# Patient Record
Sex: Female | Born: 1965 | Race: Black or African American | Hispanic: No | Marital: Single | Smoking: Never smoker
Health system: Southern US, Community
[De-identification: ages and names within clinical notes are randomized; demographics above are authoritative.]

## PROBLEM LIST (undated history)

## (undated) DIAGNOSIS — I10 Essential (primary) hypertension: Secondary | ICD-10-CM

## (undated) DIAGNOSIS — G510 Bell's palsy: Secondary | ICD-10-CM

## (undated) HISTORY — PX: OTHER SURGICAL HISTORY: SHX169

## (undated) HISTORY — DX: Bell's palsy: G51.0

## (undated) HISTORY — PX: TONSILLECTOMY: SUR1361

---

## 2012-06-17 ENCOUNTER — Encounter (HOSPITAL_COMMUNITY): Payer: Self-pay | Admitting: *Deleted

## 2012-06-17 ENCOUNTER — Emergency Department (HOSPITAL_COMMUNITY)
Admission: EM | Admit: 2012-06-17 | Discharge: 2012-06-18 | Discharge status: Against Medical Advice | Payer: Self-pay | Attending: Emergency Medicine | Admitting: Emergency Medicine

## 2012-06-17 DIAGNOSIS — R0602 Shortness of breath: Secondary | ICD-10-CM | POA: Insufficient documentation

## 2012-06-17 NOTE — ED Notes (Signed)
Pt. Stated, I was at home and I started having some swelling around my wrist and having shortness of breath less than hour.

## 2012-06-17 NOTE — ED Notes (Signed)
Called patient x3 no answer

## 2014-09-29 ENCOUNTER — Encounter (HOSPITAL_COMMUNITY): Payer: Self-pay | Admitting: Emergency Medicine

## 2014-09-29 ENCOUNTER — Emergency Department (HOSPITAL_COMMUNITY): Payer: Medicaid Other

## 2014-09-29 ENCOUNTER — Emergency Department (HOSPITAL_COMMUNITY)
Admission: EM | Admit: 2014-09-29 | Discharge: 2014-09-30 | Disposition: A | Discharge status: Home / Self Care | Payer: Medicaid Other | Attending: Emergency Medicine | Admitting: Emergency Medicine

## 2014-09-29 DIAGNOSIS — M79621 Pain in right upper arm: Secondary | ICD-10-CM | POA: Insufficient documentation

## 2014-09-29 DIAGNOSIS — M79601 Pain in right arm: Secondary | ICD-10-CM

## 2014-09-29 NOTE — ED Notes (Signed)
The pt has  Rt arm pain for 2 months.   Sometimes she cannot straighten it out.  At present she can straighten it out

## 2014-09-29 NOTE — ED Notes (Addendum)
Pt reports pain has been intermittent, states "a bulge pops up in my arm and my fingers cramp up." Describes pain as excruciating when episode occurs. Denies injury.

## 2014-09-29 NOTE — ED Provider Notes (Signed)
CSN: [id number]     Arrival date & time 09/29/14  1919 History  This chart was scribed for non-physician practitioner, Ladona MowJoe Quetzalli Clos, PA-C working with Toy CookeyMegan Docherty, MD by Greggory StallionKayla Andersen, ED scribe. This patient was seen in room TR04C/TR04C and the patient's care was started at 9:06 PM.   Chief Complaint  Patient presents with  . Arm Pain   The history is provided by the patient. No language interpreter was used.   HPI Comments: Grace Young is a 48 y.o. female who presents to the Emergency Department complaining of intermittent right arm pain that started about 2 months ago. She intermittently has trouble bending her arm but states it has become more frequent and will lock up on her. States her fingers will also cramp up when her arm locks up. Symptoms worsened today. She states she has noticed that it will lock up when she tries to stretch. Denies numbness or tingling. Patient states that her symptoms are typically worse in the morning, as she uses her arm or throughout the day they become better.  History reviewed. No pertinent past medical history. History reviewed. No pertinent past surgical history. No family history on file. History  Substance Use Topics  . Smoking status: Never Smoker   . Smokeless tobacco: Not on file  . Alcohol Use: 0.6 oz/week    1 Shots of liquor per week   OB History   Grav Para Term Preterm Abortions TAB SAB Ect Mult Living                 Review of Systems  Musculoskeletal: Positive for myalgias.  Neurological: Negative for numbness.  All other systems reviewed and are negative.  Allergies  Review of patient's allergies indicates no known allergies.  Home Medications   Prior to Admission medications   Not on File   BP 203/132  Pulse 109  Temp(Src) 98.7 F (37.1 C) (Oral)  Resp 20  Ht 5\' 8"  (1.727 m)  Wt 251 lb (113.853 kg)  BMI 38.17 kg/m2  SpO2 100%  LMP 09/13/2014  Physical Exam  Nursing note and vitals reviewed. Constitutional: She  is oriented to person, place, and time. She appears well-developed and well-nourished. No distress.  HENT:  Head: Normocephalic and atraumatic.  Eyes: Conjunctivae and EOM are normal.  Neck: Neck supple. No tracheal deviation present.  Cardiovascular: Normal rate.   Pulmonary/Chest: Effort normal. No respiratory distress.  Musculoskeletal: Normal range of motion.  Patient has full active and passive range of motion of right shoulder, elbow, wrist. 5 out of 5 motor strength in all shoulder, elbow, wrist. Radial pulse 2+, distal sensation intact. No obvious effusion, bursitis noted the patient's elbow, or shoulder. No evidence of medial or lateral epicondylitis. Tinel negative on the right wrist.   Neurological: She is alert and oriented to person, place, and time.  Skin: Skin is warm and dry.  Psychiatric: She has a normal mood and affect. Her behavior is normal.    ED Course  Procedures (including critical care time)  DIAGNOSTIC STUDIES: Oxygen Saturation is 98% on RA, normal by my interpretation.    COORDINATION OF CARE: 9:17 PM-Discussed treatment plan which includes xray with pt at bedside and pt agreed to plan.   Labs Review Labs Reviewed  I-STAT CHEM 8, ED - Abnormal; Notable for the following:    Potassium 3.4 (*)    Glucose, Bld 102 (*)    All other components within normal limits    Imaging Review Dg Elbow  Complete Right  09/29/2014   CLINICAL DATA:  Acute onset of right distal anterior humerus pain. No known history of injury. Initial encounter.  EXAM: RIGHT ELBOW - COMPLETE 3+ VIEW  COMPARISON:  None.  FINDINGS: There is no evidence of fracture or dislocation. Irregularity about the coronoid process is thought to be degenerative in nature. Apparent loose bodies are noted about the joint space. There is suggestion of a small elbow joint effusion. The soft tissues are unremarkable in appearance.  IMPRESSION: 1. No evidence of fracture or dislocation. 2. Apparent loose bodies  noted about the elbow joint space. 3. Suggestion of small elbow joint effusion.   Electronically Signed   By: Roanna Raider M.D.   On: 09/29/2014 23:24     EKG Interpretation None      MDM   Final diagnoses:  Pain of right upper extremity    Patient with right arm stiffness, worsen the morning, which seems to be mostly in her elbow. Patient also complaining of cramping sensation in her biceps region forearm. Will obtain radiographs of her elbow, basic labs to rule out electrolyte abnormality.  Radiographs elbow with impression: 1. No evidence of fracture or dislocation. 2. Apparent loose bodies noted about the elbow joint space. 3. Suggestion of small elbow joint effusion.  These findings may be consistent with patient's stiffness in her elbow, and may also contribute to patient's cramping sensation in her biceps and forearm. We will have patient follow up with orthopedics for further evaluation.  Patient's Chem-8 returned with a mild hypokalemia. Patient states this is a typical finding for her and she has required treatment with potassium in the past. We'll order patient a dose of oral potassium today. Patient has also been consistently hypertensive throughout the visit. I spoke with patient about this, she states she has been evaluated for it in the past and was recommended for followup with primary care to help treat her hypertension. Patient reports being noncompliant with these followups, and states she has been consistently hypertensive for "some time now". I strongly recommended patient followup with her primary care physician regarding her high blood pressure. Patient also has been consistently tachycardic during her visit, in the 100-110 range on exam for me. Patient reports having anxiety, and being anxious since she has had this complaint. Patient denying any shortness of breath, palpitations, dizziness, lightheadedness, chest pain. Does complain and leveling of anxiety on exam, I  believe patient's tachycardia to be due to her anxiety. Patient was agreeable to this plan, and states she would like to get it under control. I provided patient with a resource guide to help her find a primary care physician to followup on her high blood pressure. I discussed return precautions with patient, and encourage her to call or return to the ER should her symptoms change, worsen, persist or should she have a questions or concerns.  Signed,  Ladona Mow, PA-C 4:21 AM  This patient discussed with Dr. Toy Cookey, M.D.  I personally performed the services described in this documentation, which was scribed in my presence. The recorded information has been reviewed and is accurate.  Monte Fantasia, PA-C 09/30/14 0421  Monte Fantasia, PA-C 09/30/14 [phone]

## 2014-09-30 LAB — I-STAT CHEM 8, ED
BUN: 6 mg/dL (ref 6–23)
CALCIUM ION: 1.14 mmol/L (ref 1.12–1.23)
CHLORIDE: 106 meq/L (ref 96–112)
Creatinine, Ser: 0.9 mg/dL (ref 0.50–1.10)
Glucose, Bld: 102 mg/dL — ABNORMAL HIGH (ref 70–99)
HCT: 38 % (ref 36.0–46.0)
Hemoglobin: 12.9 g/dL (ref 12.0–15.0)
Potassium: 3.4 mEq/L — ABNORMAL LOW (ref 3.7–5.3)
Sodium: 138 mEq/L (ref 137–147)
TCO2: 22 mmol/L (ref 0–100)

## 2014-09-30 MED ORDER — POTASSIUM CHLORIDE CRYS ER 20 MEQ PO TBCR
40.0000 meq | EXTENDED_RELEASE_TABLET | Freq: Once | ORAL | Status: AC
  Administered 2014-09-30: 40 meq via ORAL
  Filled 2014-09-30: qty 2

## 2014-09-30 NOTE — Discharge Instructions (Signed)
Arthralgia °Your caregiver has diagnosed you as suffering from an arthralgia. Arthralgia means there is pain in a joint. This can come from many reasons including: °· Bruising the joint which causes soreness (inflammation) in the joint. °· Wear and tear on the joints which occur as we grow older (osteoarthritis). °· Overusing the joint. °· Various forms of arthritis. °· Infections of the joint. °Regardless of the cause of pain in your joint, most of these different pains respond to anti-inflammatory drugs and rest. The exception to this is when a joint is infected, and these cases are treated with antibiotics, if it is a bacterial infection. °HOME CARE INSTRUCTIONS  °· Rest the injured area for as long as directed by your caregiver. Then slowly start using the joint as directed by your caregiver and as the pain allows. Crutches as directed may be useful if the ankles, knees or hips are involved. If the knee was splinted or casted, continue use and care as directed. If an stretchy or elastic wrapping bandage has been applied today, it should be removed and re-applied every 3 to 4 hours. It should not be applied tightly, but firmly enough to keep swelling down. Watch toes and feet for swelling, bluish discoloration, coldness, numbness or excessive pain. If any of these problems (symptoms) occur, remove the ace bandage and re-apply more loosely. If these symptoms persist, contact your caregiver or return to this location. °· For the first 24 hours, keep the injured extremity elevated on pillows while lying down. °· Apply ice for 15-20 minutes to the sore joint every couple hours while awake for the first half day. Then 03-04 times per day for the first 48 hours. Put the ice in a plastic bag and place a towel between the bag of ice and your skin. °· Wear any splinting, casting, elastic bandage applications, or slings as instructed. °· Only take over-the-counter or prescription medicines for pain, discomfort, or fever as  directed by your caregiver. Do not use aspirin immediately after the injury unless instructed by your physician. Aspirin can cause increased bleeding and bruising of the tissues. °· If you were given crutches, continue to use them as instructed and do not resume weight bearing on the sore joint until instructed. °Persistent pain and inability to use the sore joint as directed for more than 2 to 3 days are warning signs indicating that you should see a caregiver for a follow-up visit as soon as possible. Initially, a hairline fracture (break in bone) may not be evident on X-rays. Persistent pain and swelling indicate that further evaluation, non-weight bearing or use of the joint (use of crutches or slings as instructed), or further X-rays are indicated. X-rays may sometimes not show a small fracture until a week or 10 days later. Make a follow-up appointment with your own caregiver or one to whom we have referred you. A radiologist (specialist in reading X-rays) may read your X-rays. Make sure you know how you are to obtain your X-ray results. Do not assume everything is normal if you do not hear from us. °SEEK MEDICAL CARE IF: °Bruising, swelling, or pain increases. °SEEK IMMEDIATE MEDICAL CARE IF:  °· Your fingers or toes are numb or blue. °· The pain is not responding to medications and continues to stay the same or get worse. °· The pain in your joint becomes severe. °· You develop a fever over 102° F (38.9° C). °· It becomes impossible to move or use the joint. °MAKE SURE YOU:  °·   Understand these instructions. °· Will watch your condition. °· Will get help right away if you are not doing well or get worse. °Document Released: 12/10/2005 Document Revised: 03/03/2012 Document Reviewed: 07/28/2008 °ExitCare® Patient Information ©2015 [employer]. This information is not intended to replace advice given to you by your health care provider. Make sure you discuss any questions you have with your health care  provider. ° ° ° °Emergency Department Resource Guide °1) Find a Doctor and Pay Out of Pocket °Although you won't have to find out who is covered by your insurance plan, it is a good idea to ask around and get recommendations. You will then need to call the office and see if the doctor you have chosen will accept you as a new patient and what types of options they offer for patients who are self-pay. Some doctors offer discounts or will set up payment plans for their patients who do not have insurance, but you will need to ask so you aren't surprised when you get to your appointment. ° °2) Contact Your Local Health Department °Not all health departments have doctors that can see patients for sick visits, but many do, so it is worth a call to see if yours does. If you don't know where your local health department is, you can check in your phone book. The CDC also has a tool to help you locate your state's health department, and many state websites also have listings of all of their local health departments. ° °3) Find a Walk-in Clinic °If your illness is not likely to be very severe or complicated, you may want to try a walk in clinic. These are popping up all over the country in pharmacies, drugstores, and shopping centers. They're usually staffed by nurse practitioners or physician assistants that have been trained to treat common illnesses and complaints. They're usually fairly quick and inexpensive. However, if you have serious medical issues or chronic medical problems, these are probably not your best option. ° °No Primary Care Doctor: °- Call [facility] at  832-8000 - they can help you locate a primary care doctor that  accepts your insurance, provides certain services, etc. °- Physician Referral Service- 1-800-533-3463 ° °Chronic Pain Problems: °Organization         Address  Phone   Notes  °[facility]  (336) 297-2271 Patients need to be referred by their primary care doctor.   ° °Medication Assistance: °Organization         Address  Phone   Notes  °[place] [facility] [address] °[place], [place] [address] (336) 641-8030 --Must be a resident of [place] °-- Must have NO insurance coverage whatsoever (no Medicaid/ Medicare, etc.) °-- The pt. MUST have a primary care doctor that directs their care regularly and follows them in the community °  °[facility]  (866) 331-1348   °[employer]  (888) 892-1162   ° °Agencies that provide inexpensive medical care: °Organization         Address  Phone   Notes  °[facility]  (336) 832-8035   °[facility]    (336) 832-7272   °[facility] [address] °[place], [place] [address] (336) 832-4777   °[facility] [address], °[place] (336) 271-4999   °[facility]    (336) 373-0678   °[facility]    (336) 272-1050   °[facility] °   [address], [place] Phone:  (336) 832-4444, Fax:  (336) 832-4440 Hours of Operation:  9 am - 6 pm, M-F.  Also accepts Medicaid/Medicare and self-pay.  °[facility] ° [address], [place] Phone: (336) 832-3150, Fax: (336) [phone]. Hours of Operation:  8:30 am - 5:30 pm, M-F.  Also accepts Medicaid and self-pay.  °[facility] [place] [address], [place] Phone: (336) 878-6027   °[facility] [address], [place], [place] (336)723-1848, Ext. 123 Mondays & Thursdays: 7-9 AM.  First 15 patients are seen on a first come, first serve basis. °  ° °Medicaid-accepting [place] Providers: ° °Organization         Address  Phone   Notes  °[facility] [address], [place] (336) 641-2100 Also accepts self-pay patients.  °[facility] [address], [place] ° (336) 856-9996   °[facility] [address], [place] (336) 288-8857   °[facility] [address] [place] [address], [place] (336) 299-7000   °Veita Bland [address], [place]  ° (336) 373-1557 Only accepts [place] Access Medicaid patients after they have their name applied to their card.  ° °Self-Pay (no insurance) in [place]: ° °Organization         Address  Phone   Notes  °Sickle Cell Patients, [facility] [address], [place] (336) 832-1970   °[facility] [address], [place] (336) 832-4400   °[facility] ° [address], [place] (336) 992-4800   °[facility]/Dr. Osei-Bonsu ° [address] [place] [address], [place] or [address], [place] (336) 841-8500 Phone number for both [place] and [place] locations is the same.  °[facility] [address], [place] (336) 299-0000   °[facility] [address] [place] [address], [place] or [address] (336) 852-7530 °(336) 878-2260   °[facility] [address], [place] (336) 350-1642, phone; (336) 294-5005, fax Sees patients 1st and 3rd Saturday of every month.  Must not qualify for public or private insurance (i.e. Medicaid, Medicare, [place] Health Choice, Veterans' Benefits) • Household income should be no more than 200% of the poverty level •The clinic cannot treat you if you are pregnant or think you are pregnant • Sexually transmitted diseases are not treated at the clinic.  ° ° °Dental Care: °Organization         Address  Phone  Notes  °[facility] [address], [place] (336) 641-6152 Accepts children up to age 21 who are enrolled in Medicaid or [place] Health Choice; pregnant women with a Medicaid card; and children who have applied for Medicaid or [place] Health Choice, but were declined, whose parents can pay a reduced fee at time of service.    °[facility] [place]  [address], [place] (336) 641-7733 Accepts children up to age 21 who are enrolled in Medicaid or [place] Health Choice; pregnant women with a Medicaid card; and children who have applied for Medicaid or [place] Health Choice, but were declined, whose parents can pay a reduced fee at time of service.  °[facility] ° [address],  (336)   641-4533 Patients are seen by appointment only. Walk-ins are not accepted. [facility] will see patients 18 years of age and older. °Monday - Tuesday (8am-5pm) °Most Wednesdays (8:30-5pm) °$30 per visit, cash only  °[facility] ° [address], [place] (336) 641-4533 Patients are seen by appointment only. Walk-ins are not accepted. [facility] will see patients 18 years of age and older. °One Wednesday Evening (Monthly: Volunteer Based).  $30 per visit, cash only  °[facility]  (919) 537-3737 for adults; Children under age 4, call Graduate Pediatric Dentistry at (919) [phone]. Children aged 4-14, please call (919) [phone] to request a pediatric application. ° Dental services are provided in all areas of dental care including fillings, crowns and bridges, complete and partial dentures, implants, gum treatment, root canals, and extractions. Preventive care is also provided. Treatment is provided to both adults and children. °Patients are selected via a lottery and there is often a waiting list. °  °[facility] [address], °[place] ° (336) 763-8833 [email or web address] °  °[facility] [address], [place], [place] (336)723-1848, Ext. 123 Second and Fourth Thursday of each month, opens at 6:30 AM; Clinic ends at 9 AM.  Patients are seen on a first-come first-served basis, and a limited number are seen during each clinic.  ° °[facility] ° [address], [place], [place] (336)  723-7904   Eligibility Requirements °You must have lived in [place], [place], or [place] counties for at least the last three months. °  You cannot be eligible for state or federal sponsored healthcare insurance, including Veterans Administration, Medicaid, or Medicare. °  You generally cannot be eligible for healthcare insurance through your employer.  °  How to apply: °Eligibility screenings are held every Tuesday and Wednesday afternoon from 1:00 pm until 4:00 pm. You do not need an appointment for the interview!  °[facility] [address], [place], [place] 336-631-2330   °[facility]  336-342-8273   °[facility]  336-703-3100   °[facility]  336-570-6415   ° °Behavioral Health Resources in the Community: °Intensive Outpatient Programs °Organization         Address  Phone  Notes  °[facility] [address], [place], [place] 336-878-6098   °[facility] [address], [place], [place] 336-832-9800   °[facility] [address], [place], [place] ° 336-882-2125   °[facility] [address],  °[place], [place] 1-800-853-5163 or 336-641-4981   °Substance Abuse Resources °Organization         Address  Phone  Notes  °[facility]  336-882-2125   °[facility]  336-784-9470   °[facility]  336-285-9073   °[facility]  336-845-3988   °Residential & Outpatient Substance Abuse Program  1-800-659-3381   °Psychological Services °Organization         Address  Phone  Notes  °[facility]  336- 832-9600   °[facility]  336- 378-7881   °[facility] [address], [place] 1-800-853-5163 or 336-641-4981   ° °Mobile Crisis Teams °Organization         Address  Phone  Notes  °[facility], Mobile Crisis Care Unit  1-877-626-1772   °Assertive °[facility] ° [address] [place], [place] 336-834-9664   °Sharon DeEsch [address] °[place] 336-554-5454   ° °Self-Help/Support Groups °Organization         Address  Phone             Notes  °[facility] [place] - variety of support groups  336- 373-1402 Call for more information  °Narcotics Anonymous (NA), [facility] [address], °[place]  2 meetings at this location  ° °Residential Treatment Programs °Organization         Address  Phone  Notes  °[facility] [address],    °[place]  1-866-801-8205   °[facility] ° [address], [place], [place] 704-293-8524   °[facility] [address], [place] 336-845-3988 Admissions: 8am-3pm M-F  °[facility] [address]    °[place], [place] 336-841-1104   °[facility] [address], [place], [place] 336-379-7146   °[facility] [address]  °[place], [place] 336-285-9073   °[facility] - Intensive Outpatient [address], [place], [place] 336-852-3033   °[facility] [address]  °[place], [place] 1-877-615-2722 or 336-784-9470   °[facility] ([facility] [address] [place], [place] 336-227-7417 Accepts Medicaid  °[facility] [address]  ° [place] 1-800-659-3381 Substance Abuse/Addiction Treatment  ° °[place] Behavioral Health Resources °Organization         Address  Phone  Notes  °[facility]  (888) 581-9988   °Julie Brannon, PhD [address] [place], [place]   (336) 349-5553 or (336) 951-0000   °[facility]   [address] °[place], [place] (336) 349-4454   °[facility] [address], [place], [place] (336) 342-8316 Insurance/Medicaid/sponsorship through [facility]  °[facility] [address]                                    [place], [place] (336) 342-8316  Therapy/tele-psych/case  °[facility] [address]  ° [place], [place] (336) 349-2233    °Dr. Arfeen  (336) 349-4544   °[facility] [place]  [employer] [place] [facility] 1) [address], [place] °2) [address], [place] °3)  [address], [place] (336) 349-3220 °(336) 342-7768 ° °(336) 342-8140   °[place] [facility] (336) 342-1394 or (336) 342-3537 (After Hours)    ° ° ° ° °

## 2014-09-30 NOTE — ED Provider Notes (Signed)
Medical screening examination/treatment/procedure(s) were performed by non-physician practitioner and as supervising physician I was immediately available for consultation/collaboration.  Megan Docherty, MD 09/30/14 0926 

## 2014-09-30 NOTE — ED Notes (Signed)
Pt broke potassium tablet in half; spit half of tablet in sink; able to take one tablet and half of second tablet

## 2016-02-20 ENCOUNTER — Emergency Department (HOSPITAL_COMMUNITY): Payer: Medicaid Other

## 2016-02-20 ENCOUNTER — Encounter (HOSPITAL_COMMUNITY): Payer: Self-pay | Admitting: *Deleted

## 2016-02-20 ENCOUNTER — Emergency Department (HOSPITAL_COMMUNITY)
Admission: EM | Admit: 2016-02-20 | Discharge: 2016-02-20 | Disposition: A | Discharge status: Home / Self Care | Payer: Medicaid Other | Attending: Emergency Medicine | Admitting: Emergency Medicine

## 2016-02-20 DIAGNOSIS — W228XXA Striking against or struck by other objects, initial encounter: Secondary | ICD-10-CM | POA: Diagnosis not present

## 2016-02-20 DIAGNOSIS — Z3202 Encounter for pregnancy test, result negative: Secondary | ICD-10-CM | POA: Diagnosis not present

## 2016-02-20 DIAGNOSIS — Y998 Other external cause status: Secondary | ICD-10-CM | POA: Insufficient documentation

## 2016-02-20 DIAGNOSIS — M25561 Pain in right knee: Secondary | ICD-10-CM

## 2016-02-20 DIAGNOSIS — S8991XA Unspecified injury of right lower leg, initial encounter: Secondary | ICD-10-CM | POA: Insufficient documentation

## 2016-02-20 DIAGNOSIS — Y9389 Activity, other specified: Secondary | ICD-10-CM | POA: Diagnosis not present

## 2016-02-20 DIAGNOSIS — Y9289 Other specified places as the place of occurrence of the external cause: Secondary | ICD-10-CM | POA: Diagnosis not present

## 2016-02-20 LAB — I-STAT BETA HCG BLOOD, ED (MC, WL, AP ONLY): I-stat hCG, quantitative: <5 m[IU]/mL (ref <5)

## 2016-02-20 MED ORDER — IBUPROFEN 400 MG PO TABS
800.0000 mg | ORAL_TABLET | Freq: Once | ORAL | Status: AC
  Administered 2016-02-20: 800 mg via ORAL
  Filled 2016-02-20: qty 2

## 2016-02-20 MED ORDER — IBUPROFEN 800 MG PO TABS: 800.0000 mg | ORAL_TABLET | Freq: Three times a day (TID) | ORAL | Status: DC

## 2016-02-20 MED ORDER — OXYCODONE-ACETAMINOPHEN 5-325 MG PO TABS
ORAL_TABLET | ORAL | Status: AC
  Filled 2016-02-20: qty 1

## 2016-02-20 MED ORDER — OXYCODONE-ACETAMINOPHEN 5-325 MG PO TABS
1.0000 | ORAL_TABLET | Freq: Once | ORAL | Status: AC
  Administered 2016-02-20: 1 via ORAL

## 2016-02-20 MED ORDER — HYDROCHLOROTHIAZIDE 12.5 MG PO TABS: 12.5000 mg | ORAL_TABLET | Freq: Every day | ORAL | Status: DC

## 2016-02-20 NOTE — ED Notes (Signed)
Pt is in stable condition upon d/c and ambulates from ED. 

## 2016-02-20 NOTE — ED Notes (Signed)
PA aware of pt's BP.  

## 2016-02-20 NOTE — ED Notes (Signed)
Patient transported to CT 

## 2016-02-20 NOTE — ED Provider Notes (Signed)
CSN: [id number]     Arrival date & time 02/20/16  1137 History  By signing my name below, I, Soijett Blue, attest that this documentation has been prepared under the direction and in the presence of Glean Hess, PA-C Electronically Signed: Soijett Blue, ED Scribe. 02/20/2016. 5:12 PM.   Chief Complaint  Patient presents with  . Knee Pain     The history is provided by the patient. No language interpreter was used.    Grace Young is a 50 y.o. female with a medical hx of HTN who presents to the Emergency Department complaining of constant right knee pain onset yesterday morning. She notes that she hit her right knee while attempting to sit on her couch. Pt right knee pain is worsened with movement. Pt denies any alleviating factors. Pt is having associated symptoms of gait problem due to pain. She notes that she has tried ice and ibuprofen with no relief of her symptoms. She denies color change, wound, rash, numbness, tingling, weakness, and any other symptoms. Pt has a PMHx of HTN but she does not take any medications for this.   History reviewed. No pertinent past medical history. History reviewed. No pertinent past surgical history. History reviewed. No pertinent family history. Social History  Substance Use Topics  . Smoking status: Never Smoker   . Smokeless tobacco: None  . Alcohol Use: 0.6 oz/week    1 Shots of liquor per week   OB History    No data available      Review of Systems  Musculoskeletal: Positive for arthralgias and gait problem (due to pain).  Skin: Negative for color change, rash and wound.  Neurological: Negative for weakness and numbness.       No tingling      Allergies  Review of patient's allergies indicates no known allergies.  Home Medications   Prior to Admission medications   Not on File    BP 215/128 mmHg  Pulse 95  Temp(Src) 98.2 F (36.8 C) (Oral)  Resp 16  SpO2 100%  LMP 01/23/2016 Physical Exam  Constitutional: She is  oriented to person, place, and time. She appears well-developed and well-nourished. No distress.  HENT:  Head: Normocephalic and atraumatic.  Right Ear: External ear normal.  Left Ear: External ear normal.  Nose: Nose normal.  Eyes: Conjunctivae and EOM are normal. Right eye exhibits no discharge. Left eye exhibits no discharge. No scleral icterus.  Neck: Normal range of motion. Neck supple.  Cardiovascular: Normal rate, regular rhythm and intact distal pulses.   Pulmonary/Chest: Effort normal and breath sounds normal. No respiratory distress.  Musculoskeletal: She exhibits no edema.       Right knee: She exhibits decreased range of motion. Tenderness found.  Right knee: Diffuse TTP of anterior knee with decreased ROM due to pain. No MCL or LCL laxity. Anterior and posterior drawer testing negative. Patient able to straight leg raise right lower extremity. No erythema, edema, or heat. Compartments soft.  Neurological: She is alert and oriented to person, place, and time. She has normal strength. No sensory deficit.  Strength and sensation intact.  Skin: Skin is warm and dry. She is not diaphoretic.  Psychiatric: She has a normal mood and affect. Her behavior is normal.  Nursing note and vitals reviewed.   ED Course  Procedures (including critical care time)  DIAGNOSTIC STUDIES: Oxygen Saturation is 100% on RA, nl by my interpretation.    COORDINATION OF CARE: 5:09 PM Discussed treatment plan with pt at  bedside which includes right knee xray, CT right knee, HCG and pt agreed to plan.   Labs Review Labs Reviewed  I-STAT BETA HCG BLOOD, ED (MC, WL, AP ONLY)    Imaging Review Ct Knee Right Wo Contrast  02/20/2016  CLINICAL DATA:  50 year old female with fall and anterior right and swelling. Follow-up earlier radiograph. EXAM: CT OF THE right KNEE WITHOUT CONTRAST TECHNIQUE: Multidetector CT imaging of the right knee was performed according to the standard protocol. Multiplanar CT  image reconstructions were also generated. COMPARISON:  Right knee radiograph dated 02/20/2016 FINDINGS: There is no acute fracture or dislocation. There is mild osteoarthritic changes with mild narrowing of medial compartment as well as marginal spurring. A 1 cm structure in the proximal tibial epiphysis represent a subchondral cyst. No significant joint effusion identified. There is slight haziness of ACL. Clinical correlation is recommended. MRI may provide better evaluation if a ligamentous or meniscal injury is clinically suspected. A 1.6 x 1.3 cm low-density structure in posterior aspect of knee within the popliteal fossa may represent a small Baker cyst. IMPRESSION: No acute fracture or dislocation. Mild osteoarthritic changes. Electronically Signed   By: Elgie Collard M.D.   On: 02/20/2016 18:14   Dg Knee Complete 4 Views Right  02/20/2016  CLINICAL DATA:  Acute left knee pain after fall yesterday. Initial encounter. EXAM: RIGHT KNEE - COMPLETE 4+ VIEW COMPARISON:  None. FINDINGS: There appears to be a mildly displaced and probably comminuted fracture involving the lateral tibial plateau. Mild narrowing and osteophyte formation is seen involving the medial joint space. No soft tissue abnormality is noted. IMPRESSION: Probable mildly displaced and comminuted lateral tibial plateau fracture. CT scan may be performed for further evaluation. Electronically Signed   By: Lupita Raider, M.D.   On: 02/20/2016 13:41   I have personally reviewed and evaluated these images and lab results as part of my medical decision-making.   EKG Interpretation None      MDM   Final diagnoses:  Right knee pain    50 year old female presents with right knee pain. States she hit her knee on the couch while trying to sit down and has had constant pain and swelling since that time. Denies numbness, weakness, paresthesia. Patient is afebrile. On exam, she has tenderness to palpation to the anterior aspect of her  right knee with decreased range of motion due to pain. No MCL or LCL laxity. Negative anterior and posterior drawer testing. Patient able to straight leg raise right lower extremity. Patient is neurovascularly intact. Plain film of right knee remarkable for probable mildy displaced and comminuted lateral tibial plateau fracture. CT negative for acute fracture or dislocation. Discussed findings with patient. Will give knee sleeve. Advised to rest, ice, elevate, and take tylenol or ibuprofen for pain. Patient to follow up with orthopedics for persistent symptoms.   Patient hypertensive in the emergency department. She states she has a history of hypertension but does not take any medications for this. Patient is asymptomatic. She denies headache, dizziness, vision changes, chest pain. No signs of hypertensive emergency. Per record review, patient hypertensive during last evaluation in the ED. Patient to follow-up with PCP for further evaluation and management of HTN. Will start HCTZ. Spoke with patient at length regarding importance of follow-up. Patient has medicaid, and notes she will use the resource list to establish care. Strict return precautions discussed. Patient verbalizes her understanding and is in agreement with plan.   BP 215/128 mmHg  Pulse 95  Temp(Src)  98.2 F (36.8 C) (Oral)  Resp 16  SpO2 100%  LMP 01/23/2016  I personally performed the services described in this documentation, which was scribed in my presence. The recorded information has been reviewed and is accurate.    Mady Gemma, PA-C 02/20/16 1958  Pricilla Loveless, MD 02/21/16 [phone]

## 2016-02-20 NOTE — Discharge Instructions (Signed)
1. Medications: tylenol or motrin for pain, usual home medications 2. Treatment: drink plenty of fluids, wear knee sleeve and rest, ice, and elevate 3. Follow Up: please followup with the orthopedic doctor for persistent knee pain; please follow-up with your primary doctor for your elevated BP; if you do not have a primary care doctor use the resource guide provided to find one; please return to the ER for new or worsening symptoms   Knee Pain Knee pain is a common problem. It can have many causes. The pain often goes away by following your doctor's home care instructions. Treatment for ongoing pain will depend on the cause of your pain. If your knee pain continues, more tests may be needed to diagnose your condition. Tests may include X-rays or other imaging studies of your knee. HOME CARE  Take medicines only as told by your doctor.  Rest your knee and keep it raised (elevated) while you are resting.  Do not do things that cause pain or make your pain worse.  Avoid activities where both feet leave the ground at the same time, such as running, jumping rope, or doing jumping jacks.  Apply ice to the knee area:  Put ice in a plastic bag.  Place a towel between your skin and the bag.  Leave the ice on for 20 minutes, 2-3 times a day.  Ask your doctor if you should wear an elastic knee support.  Sleep with a pillow under your knee.  Lose weight if you are overweight. Being overweight can make your knee hurt more.  Do not use any tobacco products, including cigarettes, chewing tobacco, or electronic cigarettes. If you need help quitting, ask your doctor. Smoking may slow the healing of any bone and joint problems that you may have. GET HELP IF:  Your knee pain does not stop, it changes, or it gets worse.  You have a fever along with knee pain.  Your knee gives out or locks up.  Your knee becomes more swollen. GET HELP RIGHT AWAY IF:   Your knee feels hot to the touch.  You  have chest pain or trouble breathing.   This information is not intended to replace advice given to you by your health care provider. Make sure you discuss any questions you have with your health care provider.   Document Released: 03/08/2009 Document Revised: 12/31/2014 Document Reviewed: 02/10/2014 Elsevier Interactive Patient Education 2016 [employer].   Emergency Department Resource Guide 1) Find a Doctor and Pay Out of Pocket Although you won't have to find out who is covered by your insurance plan, it is a good idea to ask around and get recommendations. You will then need to call the office and see if the doctor you have chosen will accept you as a new patient and what types of options they offer for patients who are self-pay. Some doctors offer discounts or will set up payment plans for their patients who do not have insurance, but you will need to ask so you aren't surprised when you get to your appointment.  2) Contact Your Local Health Department Not all health departments have doctors that can see patients for sick visits, but many do, so it is worth a call to see if yours does. If you don't know where your local health department is, you can check in your phone book. The CDC also has a tool to help you locate your state's health department, and many state websites also have listings of all of  their local health departments.  3) Find a [facility] If your illness is not likely to be very severe or complicated, you may want to try a walk in clinic. These are popping up all over the country in pharmacies, drugstores, and shopping centers. They're usually staffed by nurse practitioners or physician assistants that have been trained to treat common illnesses and complaints. They're usually fairly quick and inexpensive. However, if you have serious medical issues or chronic medical problems, these are probably not your best option.  No Primary Care Doctor: - Call Health Connect at   [phone] - they can help you locate a primary care doctor that  accepts your insurance, provides certain services, etc. - Physician Referral Service- [phone]  Chronic Pain Problems: Organization         Address  Phone   Notes  [facility]  [phone] Patients need to be referred by their primary care doctor.   Medication Assistance: Organization         Address  Phone   Notes  [facility] Medication [facility] [address] [place], [place] [address] [phone] --Must be a resident of [facility] -- Must have NO insurance coverage whatsoever (no Medicaid/ Medicare, etc.) -- The pt. MUST have a primary care doctor that directs their care regularly and follows them in the community   MedAssist  [phone]   [employer]  [phone]    Agencies that provide inexpensive medical care: Organization         Address  Phone   Notes  [place]  [phone]   [facility]    [phone]   [facility] [place], [place] [address] [phone]   [place] [address] [place] [phone]   Planned Parenthood    [phone]   [facility]    [phone]   [place] and [place] [address], [place] Phone:  [phone], Fax:  [phone] Hours of Operation:  9 am - 6 pm, M-F.  Also accepts Medicaid/Medicare and self-pay.  [facility] for [address], [address], [place] Phone: [phone], Fax: [phone]. Hours of Operation:  8:30 am - 5:30 pm, M-F.  Also accepts Medicaid and self-pay.  [facility] [place] [address] [place] Phone: [phone]   [place], [place], [place] [phone], Ext. [phone] Mondays & Thursdays: 7-9 AM.  First 15 patients are seen on a first come, first serve basis.     [place] Providers:  Organization         Address  Phone   Notes  [facility] [address], [place] [phone] Also accepts self-pay patients.  [facility] [address], [place]  [phone]   [place], [address], [place] [phone]   [facility] [address], [place] [phone]   Lucianne Lei [address], [place]   [phone] Only accepts [place] Access [place] patients after they have their name applied to their card.   Self-Pay (no insurance) in [facility]:  Organization         Address  Phone   Notes  Sickle Cell  Patients, [place] [phone]   [facility] [place] [phone]   [facility] [place]  [place], [place], [place] [phone]   [facility]/Dr. Osei-Bonsu  [address] [place] or [address], [place] [phone] Phone number for both [place] and [place] locations is the same.  [facility] [address], [place] [phone]   [facility] [address] [place] or [address] [phone]   [facility] [address], [place] [phone], phone; [phone], fax Sees patients 1st and 3rd Saturday of every month.  Must not qualify for public or private insurance (i.e. Medicaid, Medicare, [place] Health Choice, Veterans' Benefits)  Household income should be no more than 200% of the poverty level The clinic cannot treat you if you are pregnant or think you are pregnant  Sexually transmitted diseases are not treated at the clinic.    Dental Care: Organization         Address  Phone  Notes  [facility]  Department of [facility] [phone] Accepts children up to age 18 who are enrolled in [place] or [place]; pregnant women with a Medicaid card; and children who have applied for Medicaid or [place] Health Choice, but were declined, whose parents can pay a reduced fee at time of service.  [facility] Department of [facility]  [address], [place] [phone] Accepts children up to age 63 who are enrolled in [place] or [place]; pregnant women with a Medicaid card; and children who have applied for Medicaid or [place] Health Choice, but were declined, whose parents can pay a reduced fee at time of service.  [place] Adult Dental Access PROGRAM  [place] [phone] Patients are seen by appointment only. Walk-ins are not accepted. [place] will see patients 52 years of age and older. Monday - Tuesday (8am-5pm) Most Wednesdays (8:30-5pm) $30 per visit, cash only  [facility] Adult Dental Access PROGRAM  [address], [facility] [phone] Patients are seen by appointment only. Walk-ins are not accepted. [place] will see patients 64 years of age and older. One Wednesday Evening (Monthly: Volunteer Based).  $30 per visit, cash only  [place]  [phone] for adults; Children under age 78, call Graduate Pediatric Dentistry at [phone]. Children aged 79-14, please call [phone] to request a pediatric application.  Dental services are provided in all areas of dental care including fillings, crowns and bridges, complete and partial dentures, implants, gum treatment, root canals, and extractions. Preventive care is also provided. Treatment is provided to both adults and children. Patients are selected via a lottery and there is often a waiting list.   [facility] [address] [place]  [phone]  [email or web address]   [facility] [address] [place], [place] [phone], Ext. [phone] Second and Fourth Thursday of each month, opens at 6:30 AM; Clinic ends at 9 AM.  Patients are seen on a first-come first-served basis, and a limited number are seen during each clinic.   [facility]  [address] Hillard Danker [place], [place] [phone]   Eligibility Requirements You  must have lived in [place], [place], or [place] counties for at least the last three months.   You cannot be eligible for state or federal sponsored [employer], including [employer], [place], or [employer].   You generally cannot be eligible for healthcare insurance through your employer.    How to apply: Eligibility screenings are held every Tuesday and Wednesday afternoon from 1:00 pm until 4:00 pm. You do not need an appointment for the interview!  [facility] [address], [place], [place]   [place]  [place] Department  [place]  [phone]    Behavioral Health Resources in the Community: Intensive Outpatient Programs Organization         Address  Phone  Notes  [place]. [address] [place], [place] [phone]   [facility] Outpatient [address], [place], [place]   ADS: Alcohol & Drug Svcs [address] [place], [place]   [place] [address],  [place], [place] or [phone]   Substance Abuse Resources Organization         Address  Phone  Notes  Alcohol and Drug Services  [phone]   [place]  [phone]   [facility] [place]   Chinita Pester  [phone]   Residential & Outpatient Substance Abuse Program  [phone]   Psychological Services Organization          Address  Phone  Notes  [facility]  [place]  [phone]   [place] [address], [place] or [phone]    Mobile Crisis Teams Organization         Address  Phone  Notes  [facility], Mobile Crisis Care Unit  [phone]   [facility]ive Psychotherapeutic Services  [address] [place], [place]   [place] [address] [place] [phone]    Self-Help/Support Groups Organization         Address  Phone             Notes  [place]. of [place] - variety of support groups  [place] Call for more information  Narcotics Anonymous (NA), [facility] [address], [employer] [place]  2 meetings at this location   Special educational needs teacher         Address  Phone  Notes  ASAP Residential Treatment [place],    [place]  [phone]   [facility]  [address] [place] [phone], [place], [place]   [place], [place] [phone] Admissions: 8am-3pm M-F  Incentives Substance [place] [address]    [place], [place] [phone]   [facility] [address] [place], [place], [place]   [facility] [address],  [place], [place]   [facility] Programs - Intensive Outpatient [address] Kristeen [address], [place], [place]   [facility] ([place].,  [place], [place] or [phone]   [facility]idential Treatment Services (RTS) [address], [place], [place] Accepts Medicaid  [facility] [place] [address],  [place] [phone] Substance Abuse/Addiction Treatment   [facility] Resources Organization         Address  Phone  Notes  [facility]  [phone]   Domenic Schwab, PhD [address] Arlis Porta [place], [place]   [phone] or [phone]   Flowella Page [place], [place] [phone]   [address], [place], [place] [phone] Insurance/Medicaid/sponsorship through [facility] and Families [address] [place]                                    [place], [place] [phone] [place] [address], [place] [phone]    Dr. Adele Schilder  [phone]   [facility] Dept. 1) [address] [place] 2) [place] 3)  [address], [place] [phone]  [phone]   [place] [phone] or [phone] (After Hours)

## 2016-02-20 NOTE — ED Notes (Signed)
Pt reports falling on right knee yesterday and having severe pain and swelling since. Pain increases when bending her knee. Pt is hypertensive at triage, denies hx of same.

## 2018-07-24 ENCOUNTER — Emergency Department (HOSPITAL_COMMUNITY): Payer: Self-pay

## 2018-07-24 ENCOUNTER — Other Ambulatory Visit: Payer: Self-pay

## 2018-07-24 ENCOUNTER — Encounter (HOSPITAL_COMMUNITY): Payer: Self-pay

## 2018-07-24 ENCOUNTER — Emergency Department (HOSPITAL_COMMUNITY)
Admission: EM | Admit: 2018-07-24 | Discharge: 2018-07-24 | Disposition: A | Discharge status: Home / Self Care | Payer: Self-pay | Attending: Emergency Medicine | Admitting: Emergency Medicine

## 2018-07-24 DIAGNOSIS — I10 Essential (primary) hypertension: Secondary | ICD-10-CM

## 2018-07-24 DIAGNOSIS — R030 Elevated blood-pressure reading, without diagnosis of hypertension: Secondary | ICD-10-CM | POA: Insufficient documentation

## 2018-07-24 DIAGNOSIS — Z79899 Other long term (current) drug therapy: Secondary | ICD-10-CM | POA: Insufficient documentation

## 2018-07-24 DIAGNOSIS — M25572 Pain in left ankle and joints of left foot: Secondary | ICD-10-CM | POA: Insufficient documentation

## 2018-07-24 MED ORDER — HYDROCHLOROTHIAZIDE 25 MG PO TABS: 25.0000 mg | ORAL_TABLET | Freq: Every day | ORAL | 0 refills | Status: DC

## 2018-07-24 MED ORDER — HYDROCHLOROTHIAZIDE 12.5 MG PO CAPS
25.0000 mg | ORAL_CAPSULE | Freq: Once | ORAL | Status: AC
  Administered 2018-07-24: 25 mg via ORAL
  Filled 2018-07-24: qty 2

## 2018-07-24 NOTE — Discharge Instructions (Signed)
You are seen in the emergency department for left ankle pain and swelling.  X-rays are normal today.  There are no signs of skin infection or joint infection today.  I suspect your symptoms are from a ligamentous injury.  Over the next 48 to 72 hours we will treat your pain with high dose acetaminophen (Tylenol) 500 to 1000 mg every 8 hours.  You may add 600 mg of ibuprofen to this if you need more pain control.  Rest.  Elevate.  Ice.  Start doing range of motion exercises once her pain starts to improve.  If the pain does not improve or resolve over the next week, follow-up with podiatrist for further evaluation.  Return to the ER if there is worsening swelling, redness, warmth, fevers, chills, calf pain or swelling.  Your blood pressure was noted to be elevated today.  Your kidney function is normal.  You do not have any other symptoms of high blood pressure today so no further testing was done.  You need to start taking hydrochlorothiazide daily as prescribed.  Establish care with a primary care provider in the next month for repeat blood pressure check, your medications may need to be adjusted.  Return to the ER immediately if you have severe headache, vision changes, nausea, vomiting, dizziness, chest pain, shortness of breath, severe back or abdominal pain, loss of sensation, weakness or numbness to your extremities.

## 2018-07-24 NOTE — ED Provider Notes (Signed)
[facility]-EMERGENCY DEPT Provider Note   CSN: [id number] Arrival date & time: 07/24/18  1229     History   Chief Complaint Chief Complaint  Patient presents with  . Ankle Pain    HPI Grace Young is a 52 y.o. female here for evaluation of left ankle swelling since yesterday morning associated with pain, moderate, intermittent and worse with palpation and movement, walking and weightbearing.  She does not recall any specific trauma or previous injuries or surgeries.  Symptoms are gradually worsening.  Took 600 mg of ibuprofen which did not help.  Per triage note, patient had a dream about spraining her ankle and woke up with swelling and pain in her ankle.  She does not think she has history of sleepwalking.  No history of gout, IV drug use.  Denies fevers, chills, calf pain or swelling, redness or warmth to the joint, distal tingling or loss of sensation.  HPI  History reviewed. No pertinent past medical history.  There are no active problems to display for this patient.   History reviewed. No pertinent surgical history.   OB History   None      Home Medications    Prior to Admission medications   Medication Sig Start Date End Date Taking? Authorizing Provider  ibuprofen (ADVIL,MOTRIN) 800 MG tablet Take 1 tablet (800 mg total) by mouth 3 (three) times daily. 02/20/16  Yes Mady Gemma, PA-C  hydrochlorothiazide (HYDRODIURIL) 25 MG tablet Take 1 tablet (25 mg total) by mouth daily. 07/24/18 09/22/18  Liberty Handy, PA-C    Family History No family history on file.  Social History Social History   Tobacco Use  . Smoking status: Never Smoker  . Smokeless tobacco: Never Used  Substance Use Topics  . Alcohol use: Yes    Alcohol/week: 0.6 oz    Types: 1 Shots of liquor per week  . Drug use: No     Allergies   Patient has no known allergies.   Review of Systems Review of Systems  Musculoskeletal: Positive for arthralgias, gait  problem and joint swelling.  All other systems reviewed and are negative.    Physical Exam Updated Vital Signs BP (!) 251/154 (BP Location: Right Arm)   Pulse 99   Temp 98.4 F (36.9 C) (Oral)   Resp 18   Ht 5\' 8"  (1.727 m)   Wt 102.1 kg (225 lb)   LMP 07/18/2018   SpO2 100%   BMI 34.21 kg/m   Physical Exam  Constitutional: She is oriented to person, place, and time. She appears well-developed and well-nourished.  Non-toxic appearance.  HENT:  Head: Normocephalic.  Right Ear: External ear normal.  Left Ear: External ear normal.  Nose: Nose normal.  Eyes: Conjunctivae and EOM are normal.  Neck: Full passive range of motion without pain.  Cardiovascular: Normal rate.  2+ DP and PT pulses bilaterally.  No asymmetric calf edema or tenderness.  Pulmonary/Chest: Effort normal. No tachypnea. No respiratory distress.  Musculoskeletal: Normal range of motion. She exhibits edema and tenderness.  Left ankle: Mild edema and tenderness to the anterior proximal aspect of the foot and anteriorly/inferiorly aspect of the lateral malleolus.  Positive talar tilt.  No focal bony tenderness to metatarsals, toes,  calcaneus, Achilles tendon or medial malleolus.  Full passive range of motion of the ankle without crepitus.  No calf edema or tenderness.  Neurological: She is alert and oriented to person, place, and time.  5/5 strength with dorsiflexion and plantarflexion,  great toe flexion and extension.  Sensation to light touch intact in feet bilaterally.  Skin: Skin is warm and dry. Capillary refill takes less than 2 seconds.  No erythema, warmth to the left ankle joint  Psychiatric: Her behavior is normal. Thought content normal.     ED Treatments / Results  Labs (all labs ordered are listed, but only abnormal results are displayed) Labs Reviewed  I-STAT CHEM 8, ED    EKG None  Radiology Dg Ankle Complete Left  Result Date: 07/24/2018 CLINICAL DATA:  52 year old female with left  ankle pain and swelling. EXAM: LEFT ANKLE COMPLETE - 3+ VIEW COMPARISON:  None. FINDINGS: Preserved mortise joint alignment. No joint effusion is evident. Intact talar dome. Bone mineralization is within normal limits. Distal tibia and fibula appear intact. Calcaneus is intact with plantar surface degenerative spurring. Intact visible bones of the left foot. IMPRESSION: No acute fracture or dislocation identified about the left ankle. Electronically Signed   By: Odessa Fleming M.D.   On: 07/24/2018 13:16    Procedures Procedures (including critical care time)  Medications Ordered in ED Medications  hydrochlorothiazide (MICROZIDE) capsule 25 mg (25 mg Oral Given 07/24/18 1357)     Initial Impression / Assessment and Plan / ED Course  I have reviewed the triage vital signs and the nursing notes.  Pertinent labs & imaging results that were available during my care of the patient were reviewed by me and considered in my medical decision making (see chart for details).  Clinical Course as of Jul 24 1530  Thu Jul 24, 2018  1326 BP(!): 251/147 [CG]  1326 BP(!): 258/154 [CG]  1501 Checked with mini lab regarding chem 8; creatinine 0.8    [CG]    Clinical Course User Index [CG] Liberty Handy, PA-C    52 year old with atraumatic left ankle pain and swelling.  Per triage note, there is concern that she is sleepwalking.  Extremities neurovascularly intact.  No signs of overlying erythema, warmth to suggest cellulitis or septic arthritis.  No history of gout and ankle would be atypical joint for this.  No anticoagulants to put her at risk for hemarthrosis.  No proximal calf edema or tenderness and DVT is lower on differential.  X-ray is negative.  Considering MSK versus ligamentous injury.  She does have focal tenderness to the anterior/inferior aspect of the lateral malleolus and positive talar tilt.  Will encourage high-dose NSAIDs, rice protocol and follow-up with orthopedics if symptoms do not improve  over the next week.  Patient was noted to be hypertensive 250/150 in the ER.  Patient confirms long history of hypertension, was given 2 months prescription of antihypertensive that she finished and did not renew.  She has no associated symptoms with hypertension.  Denies sympathomimetic drug use or tobacco use.  Denies features to suggest endorgan dysfunction such as thunderclap headache, seizures, stroke symptoms, vision changes, chest pain, shortness of breath, cough, orthopnea, leg edema, back pain, hematuria.  Given lack of symptoms, will check creatinine and order hydrochlorothiazide and reassess.  I do not think emergent work-up or aggressive hypertension reduction indicated in setting of asymptomatic HTN.  Creatinine WNL. No signs of new, acute, severe EOD. Will dc with f/u with PCP within one week for recheck of BP, may need med adjustment. Chart and available pertinent old records, if available, reviewed by me. Imaging and labs in ER viewed and interpreted by me and used in the medical decision making with formal interpretation from radiologist. Discharge home  in stable condition, return precautions discussed.  Patient agreeable with plan for discharge home.   Final Clinical Impressions(s) / ED Diagnoses   Final diagnoses:  Elevated blood pressure reading in office with diagnosis of hypertension  Acute left ankle pain    ED Discharge Orders        Ordered    hydrochlorothiazide (HYDRODIURIL) 25 MG tablet  Daily     07/24/18 1518       Liberty HandyGibbons, Chrisette Man J, PA-C 07/24/18 1531    Jacalyn LefevreHaviland, Julie, MD 07/24/18 1537

## 2018-07-24 NOTE — ED Triage Notes (Signed)
Pt states that she went to sleep, had a dream about spraining her ankle, and woke up with swelling and pain in her left ankle. Pt states that she does not have a hx of sleep walking

## 2018-07-24 NOTE — ED Notes (Signed)
I stat reviewed by PA prior to discharge

## 2018-07-26 ENCOUNTER — Emergency Department (HOSPITAL_COMMUNITY): Payer: Self-pay

## 2018-07-26 ENCOUNTER — Emergency Department (HOSPITAL_COMMUNITY)
Admission: EM | Admit: 2018-07-26 | Discharge: 2018-07-26 | Disposition: A | Discharge status: Home / Self Care | Payer: Self-pay | Attending: Emergency Medicine | Admitting: Emergency Medicine

## 2018-07-26 ENCOUNTER — Encounter (HOSPITAL_COMMUNITY): Payer: Self-pay | Admitting: Emergency Medicine

## 2018-07-26 ENCOUNTER — Emergency Department (HOSPITAL_BASED_OUTPATIENT_CLINIC_OR_DEPARTMENT_OTHER): Payer: Self-pay

## 2018-07-26 DIAGNOSIS — L03116 Cellulitis of left lower limb: Secondary | ICD-10-CM | POA: Insufficient documentation

## 2018-07-26 DIAGNOSIS — I10 Essential (primary) hypertension: Secondary | ICD-10-CM | POA: Insufficient documentation

## 2018-07-26 DIAGNOSIS — M7989 Other specified soft tissue disorders: Secondary | ICD-10-CM

## 2018-07-26 DIAGNOSIS — R0602 Shortness of breath: Secondary | ICD-10-CM | POA: Insufficient documentation

## 2018-07-26 LAB — BASIC METABOLIC PANEL
Anion gap: 12 (ref 5–15)
BUN: 12 mg/dL (ref 6–20)
CHLORIDE: 100 mmol/L (ref 98–111)
CO2: 26 mmol/L (ref 22–32)
Calcium: 10 mg/dL (ref 8.9–10.3)
Creatinine, Ser: 1 mg/dL (ref 0.44–1.00)
GLUCOSE: 119 mg/dL — AB (ref 70–99)
Potassium: 3 mmol/L — ABNORMAL LOW (ref 3.5–5.1)
Sodium: 138 mmol/L (ref 135–145)

## 2018-07-26 LAB — CBC WITH DIFFERENTIAL/PLATELET
Basophils Absolute: 0 10*3/uL (ref 0.0–0.1)
Basophils Relative: 0 %
Eosinophils Absolute: 0.1 10*3/uL (ref 0.0–0.7)
Eosinophils Relative: 1 %
HEMATOCRIT: 36.4 % (ref 36.0–46.0)
HEMOGLOBIN: 12.2 g/dL (ref 12.0–15.0)
LYMPHS PCT: 14 %
Lymphs Abs: 1.4 10*3/uL (ref 0.7–4.0)
MCH: 33.3 pg (ref 26.0–34.0)
MCHC: 33.5 g/dL (ref 30.0–36.0)
MCV: 99.5 fL (ref 78.0–100.0)
MONOS PCT: 7 %
Monocytes Absolute: 0.6 10*3/uL (ref 0.1–1.0)
NEUTROS ABS: 7.5 10*3/uL (ref 1.7–7.7)
NEUTROS PCT: 78 %
Platelets: 335 10*3/uL (ref 150–400)
RBC: 3.66 MIL/uL — ABNORMAL LOW (ref 3.87–5.11)
RDW: 14 % (ref 11.5–15.5)
WBC: 9.6 10*3/uL (ref 4.0–10.5)

## 2018-07-26 LAB — D-DIMER, QUANTITATIVE: D-Dimer, Quant: 1.14 ug/mL-FEU — ABNORMAL HIGH (ref 0.00–0.50)

## 2018-07-26 LAB — I-STAT BETA HCG BLOOD, ED (MC, WL, AP ONLY): I-stat hCG, quantitative: <5 m[IU]/mL (ref <5)

## 2018-07-26 MED ORDER — IOPAMIDOL (ISOVUE-370) INJECTION 76%
100.0000 mL | Freq: Once | INTRAVENOUS | Status: AC | PRN
  Administered 2018-07-26: 100 mL via INTRAVENOUS

## 2018-07-26 MED ORDER — ACETAMINOPHEN 325 MG PO TABS
650.0000 mg | ORAL_TABLET | Freq: Once | ORAL | Status: AC
  Administered 2018-07-26: 650 mg via ORAL
  Filled 2018-07-26: qty 2

## 2018-07-26 MED ORDER — IOPAMIDOL (ISOVUE-370) INJECTION 76%
INTRAVENOUS | Status: AC
  Filled 2018-07-26: qty 100

## 2018-07-26 MED ORDER — LABETALOL HCL 5 MG/ML IV SOLN
10.0000 mg | Freq: Once | INTRAVENOUS | Status: AC
  Administered 2018-07-26: 10 mg via INTRAVENOUS
  Filled 2018-07-26: qty 4

## 2018-07-26 MED ORDER — MORPHINE SULFATE (PF) 4 MG/ML IV SOLN
4.0000 mg | Freq: Once | INTRAVENOUS | Status: AC
  Administered 2018-07-26: 4 mg via INTRAVENOUS
  Filled 2018-07-26: qty 1

## 2018-07-26 MED ORDER — CEPHALEXIN 500 MG PO CAPS: 500.0000 mg | ORAL_CAPSULE | Freq: Four times a day (QID) | ORAL | 0 refills | Status: DC

## 2018-07-26 MED ORDER — HYDROCODONE-ACETAMINOPHEN 5-325 MG PO TABS: 1.0000 | ORAL_TABLET | Freq: Four times a day (QID) | ORAL | 0 refills | Status: DC | PRN

## 2018-07-26 MED ORDER — POTASSIUM CHLORIDE CRYS ER 20 MEQ PO TBCR: 20.0000 meq | EXTENDED_RELEASE_TABLET | Freq: Every day | ORAL | 0 refills | Status: DC

## 2018-07-26 MED ORDER — HYDRALAZINE HCL 20 MG/ML IJ SOLN
10.0000 mg | Freq: Once | INTRAMUSCULAR | Status: AC
  Administered 2018-07-26: 10 mg via INTRAVENOUS
  Filled 2018-07-26: qty 1

## 2018-07-26 NOTE — ED Provider Notes (Signed)
  Physical Exam  BP (!) 202/109 (BP Location: Right Arm)   Pulse 93   Temp 98.2 F (36.8 C) (Oral)   Resp 14   LMP 07/18/2018   SpO2 98%   Physical Exam  Constitutional: She appears well-developed and well-nourished. No distress.  HENT:  Head: Normocephalic and atraumatic.  Eyes: Conjunctivae and EOM are normal. No scleral icterus.  Neck: Normal range of motion.  Pulmonary/Chest: Effort normal. No respiratory distress.  Neurological: She is alert.  Skin: No rash noted. She is not diaphoretic. There is erythema.  Erythema and TTP of L ankle with no changes in ROM noted.  Psychiatric: She has a normal mood and affect.  Nursing note and vitals reviewed.   ED Course/Procedures     Procedures  MDM  Care handed off from previous provider, S. Caccavale, PA-C.  Please see their note for further detail.  Briefly, patient with a past medical history of hypertension presents for ongoing left ankle pain/swelling and high blood pressure.  Ankle pain began 4 days ago.  She was began on hydrochlorothiazide for her hypertension when seen in the ED 2 days ago.  She had unremarkable x-rays of the left ankle at that time.  She took her first dose of HCTZ last night.  Physical exam findings concerning for DVT versus cellulitis.  There is edema of the left foot with pain and tenderness palpation of the ankle, as well as overlying erythema.  Lab work shows d-dimer elevated at 1.14.  BMP shows mild hypokalemia at 3.  CBC unremarkable.  hCG is negative.  CTA of the chest is negative.  Plan is to obtain ultrasound to rule out DVT.  If negative will treat as cellulitis with antibiotics and short course of pain medicine.  Of note, patient was given labetalol with improvement in her high blood pressure.  Will give 1 dose of hydralazine for further management.  Ultrasound returned as negative for DVT.  Will treat with Keflex for cellulitis, short course of Norco and continuing HCTZ for hypertension.  Will give  follow-up information regarding primary care provider and strict return precautions. [place] PMP reviewed with no discrepancies.   Portions of this note were generated with Scientist, clinical (histocompatibility and immunogenetics)Dragon dictation software. Dictation errors may occur despite best attempts at proofreading.    Dietrich PatesKhatri, Vincentina Sollers, PA-C 07/26/18 1033    Rolan BuccoBelfi, Melanie, MD 07/26/18 [phone]

## 2018-07-26 NOTE — ED Notes (Signed)
Pt provided female urinal.

## 2018-07-26 NOTE — Discharge Instructions (Signed)
Return to ED for worsening symptoms, injuries or falls, numbness in your leg, severe chest pain or shortness of breath, coughing or vomiting up blood.

## 2018-07-26 NOTE — ED Provider Notes (Signed)
[facility]-EMERGENCY DEPT Provider Note   CSN: [id number] Arrival date & time: 07/26/18  0205     History   Chief Complaint Chief Complaint  Patient presents with  . Left ankle pain  . Hypertension    HPI Grace Young is a 52 y.o. female presenting for evaluation of left ankle pain/swelling and high blood pressure.  Patient states that on Tuesday she woke up with left ankle pain and swelling.  She denies fall, trauma, or injury.  She tried to manage at home without improvement of symptoms.  She was seen in the ER 2 days ago, during which she had a normal x-ray and was told to treat her ankle pain symptomatically.  She has been taking ibuprofen without improvement of symptoms.  She reports feeling like her foot is numb due to the swelling.  She denies leg pain, although states that she feels like her leg is heavy especially with flexion of the knee and lifting her leg.  She denies symptoms on the right side.  Patient states that on Tuesday, she felt like she became more short of breath, worse with exertion.  She denies chest pain.  She denies headache, vision changes, slurred speech, dizziness, weakness.  She took her first dose of HCTZ last night.  She denies other medical problems, does not take any other medications.  She denies recent travel, surgeries, immobilization, history of cancer, history of prior DVT/PE, or hormone use.  She denies family history of DVT/PE.  She denies pain with inspiration.  HPI  History reviewed. No pertinent past medical history.  There are no active problems to display for this patient.   History reviewed. No pertinent surgical history.   OB History   None      Home Medications    Prior to Admission medications   Medication Sig Start Date End Date Taking? Authorizing Provider  hydrochlorothiazide (HYDRODIURIL) 25 MG tablet Take 1 tablet (25 mg total) by mouth daily. 07/24/18 09/22/18 Yes Liberty Handy, PA-C  ibuprofen  (ADVIL,MOTRIN) 200 MG tablet Take 600 mg by mouth every 6 (six) hours as needed for mild pain or moderate pain.   Yes [provider]    Family History No family history on file.  Social History Social History   Tobacco Use  . Smoking status: Never Smoker  . Smokeless tobacco: Never Used  Substance Use Topics  . Alcohol use: Yes    Alcohol/week: 0.6 oz    Types: 1 Shots of liquor per week  . Drug use: No     Allergies   Patient has no known allergies.   Review of Systems Review of Systems  Respiratory: Positive for shortness of breath (With exertion).   Musculoskeletal: Positive for arthralgias and joint swelling.  All other systems reviewed and are negative.    Physical Exam Updated Vital Signs BP (!) 209/107   Pulse 93   Temp 98.2 F (36.8 C) (Oral)   Resp 14   LMP 07/18/2018   SpO2 98%   Physical Exam  Constitutional: She is oriented to person, place, and time. She appears well-developed and well-nourished. No distress.  Appears in no distress  HENT:  Head: Normocephalic and atraumatic.  Eyes: Pupils are equal, round, and reactive to light. Conjunctivae and EOM are normal.  Neck: Normal range of motion. Neck supple.  Cardiovascular: Regular rhythm and intact distal pulses.  Mildly tachycardic around 105  Pulmonary/Chest: Effort normal and breath sounds normal. No respiratory distress. She has  no wheezes.  Abdominal: Soft. Bowel sounds are normal. She exhibits no distension and no mass. There is no tenderness. There is no guarding.  Musculoskeletal: She exhibits edema and tenderness.  Warmth, erythema, and swelling of the lateral left malleolus and dorsal lateral left foot.  Pedal pulses intact bilaterally.  Good cap refill bilaterally.  Sensation intact in all toes.  Patient unwilling to range foot due to pain.  No tenderness palpation of the calf.  Neurological: She is alert and oriented to person, place, and time. No sensory deficit.  Skin: Skin  is warm and dry. Capillary refill takes less than 2 seconds.  Psychiatric: She has a normal mood and affect.  Nursing note and vitals reviewed.    ED Treatments / Results  Labs (all labs ordered are listed, but only abnormal results are displayed) Labs Reviewed  CBC WITH DIFFERENTIAL/PLATELET - Abnormal; Notable for the following components:      Result Value   RBC 3.66 (*)    All other components within normal limits  BASIC METABOLIC PANEL - Abnormal; Notable for the following components:   Potassium 3.0 (*)    Glucose, Bld 119 (*)    All other components within normal limits  D-DIMER, QUANTITATIVE (NOT [facility]) - Abnormal; Notable for the following components:   D-Dimer, Quant 1.14 (*)    All other components within normal limits  I-STAT BETA HCG BLOOD, ED ([facility], [facility], [facility] ONLY)    EKG None  Radiology Dg Ankle Complete Left  Result Date: 07/24/2018 CLINICAL DATA:  52 year old female with left ankle pain and swelling. EXAM: LEFT ANKLE COMPLETE - 3+ VIEW COMPARISON:  None. FINDINGS: Preserved mortise joint alignment. No joint effusion is evident. Intact talar dome. Bone mineralization is within normal limits. Distal tibia and fibula appear intact. Calcaneus is intact with plantar surface degenerative spurring. Intact visible bones of the left foot. IMPRESSION: No acute fracture or dislocation identified about the left ankle. Electronically Signed   By: Odessa FlemingH  Hall M.D.   On: 07/24/2018 13:16   Ct Angio Chest Pe W/cm &/or Wo Cm  Result Date: 07/26/2018 CLINICAL DATA:  Shortness of breath. LEFT foot pain. Suspect pulmonary embolism. EXAM: CT ANGIOGRAPHY CHEST WITH CONTRAST TECHNIQUE: Multidetector CT imaging of the chest was performed using the standard protocol during bolus administration of intravenous contrast. Multiplanar CT image reconstructions and MIPs were obtained to evaluate the vascular anatomy. CONTRAST:  100mL ISOVUE-370 IOPAMIDOL (ISOVUE-370) INJECTION 76% COMPARISON:  None.  FINDINGS: CARDIOVASCULAR: Adequate contrast opacification of the pulmonary artery's. Main pulmonary artery is not enlarged. No pulmonary arterial filling defects to the level of the subsegmental branches. Heart size is mildly enlarged. No pericardial effusion. Thoracic aorta is normal course and caliber, unremarkable. MEDIASTINUM/NODES: No lymphadenopathy by CT size criteria. LUNGS/PLEURA: Tracheobronchial tree is patent, no pneumothorax. Diffuse bronchial wall thickening. 1 cm air cyst RIGHT upper lobe. Faint bilateral lower lobe ground-glass opacities without focal consolidation. No pleural effusion. UPPER ABDOMEN: Included view of the abdomen is unremarkable. MUSCULOSKELETAL: Nonacute. Mild degenerative change of the thoracic spine. Review of the MIP images confirms the above findings. IMPRESSION: 1. No acute pulmonary embolism. 2. Bronchial wall thickening seen with bronchitis or reactive airway disease. No focal consolidation. 3. Mild cardiomegaly. Electronically Signed   By: Awilda Metroourtnay  Bloomer M.D.   On: 07/26/2018 06:43    Procedures Procedures (including critical care time)  Medications Ordered in ED Medications  iopamidol (ISOVUE-370) 76 % injection (has no administration in time range)  morphine 4 MG/ML injection  4 mg (4 mg Intravenous Given 07/26/18 0523)  labetalol (NORMODYNE,TRANDATE) injection 10 mg (10 mg Intravenous Given 07/26/18 0523)  iopamidol (ISOVUE-370) 76 % injection 100 mL (100 mLs Intravenous Contrast Given 07/26/18 0542)  hydrALAZINE (APRESOLINE) injection 10 mg (10 mg Intravenous Given 07/26/18 0649)     Initial Impression / Assessment and Plan / ED Course  I have reviewed the triage vital signs and the nursing notes.  Pertinent labs & imaging results that were available during my care of the patient were reviewed by me and considered in my medical decision making (see chart for details).     Patient presenting for evaluation of continued left ankle pain and swelling.   Physical exam shows patient who is neurovascularly intact.  Concerning blood pressure and heart rate on arrival.  With conjunction of lower extremity swelling, concern for possible DVT/PE.  D-dimer elevated at 1.14, and with patient's increased shortness of breath on exertion and leg swelling, will obtain ultrasound of the leg and CTA chest.  Basic labs otherwise reassuring, potassium mildly low at 3.0.  Creatinine stable.  No sign of endorgan damage.  No chest pain, doubt cardiac damage.  No obvious neurologic deficits, doubt stroke.  Labetalol given for blood pressure control and morphine for pain.  Blood pressure mildly improved with labetalol.  Pain is improved with morphine.  Will give a dose of hydralazine to further improve blood pressure.  CTA negative for PE.  Ultrasound pending.  Patient signed out to [facility], PA-C for follow-up on ultrasound.  If ultrasound is negative, consider possible cellulitis, as patient has pain, warmth, and redness.  Will treat with antibiotics and give short course of Norco.  If ultrasound is positive, treat with blood thinner and short course of Norco.  Patient to continue HCTZ and follow-up with the PCP for further management of her blood pressure.   Final Clinical Impressions(s) / ED Diagnoses   Final diagnoses:  None    ED Discharge Orders    None       Alveria Apley, PA-C 07/26/18 0717    Paula Libra, MD 07/26/18 [phone]

## 2018-07-26 NOTE — ED Notes (Signed)
Patient removing BP cuff-encouraged to keep BP cuff in place due to severely elevated BP values

## 2018-07-26 NOTE — Progress Notes (Signed)
LLE venous duplex prelim: negative for DVT. Franceska Strahm Eunice, RDMS, RVT  

## 2018-10-14 ENCOUNTER — Other Ambulatory Visit: Payer: Self-pay

## 2018-10-14 ENCOUNTER — Emergency Department (HOSPITAL_COMMUNITY): Payer: Self-pay

## 2018-10-14 ENCOUNTER — Inpatient Hospital Stay (HOSPITAL_COMMUNITY)
Admission: EM | Admit: 2018-10-14 | Discharge: 2018-10-18 | DRG: 305 | Disposition: A | Discharge status: Home / Self Care | Payer: Self-pay | Attending: Family Medicine | Admitting: Family Medicine

## 2018-10-14 ENCOUNTER — Encounter (HOSPITAL_COMMUNITY): Payer: Self-pay

## 2018-10-14 DIAGNOSIS — Z9089 Acquired absence of other organs: Secondary | ICD-10-CM

## 2018-10-14 DIAGNOSIS — Z6833 Body mass index (BMI) 33.0-33.9, adult: Secondary | ICD-10-CM

## 2018-10-14 DIAGNOSIS — Z803 Family history of malignant neoplasm of breast: Secondary | ICD-10-CM

## 2018-10-14 DIAGNOSIS — M79671 Pain in right foot: Secondary | ICD-10-CM

## 2018-10-14 DIAGNOSIS — M25571 Pain in right ankle and joints of right foot: Secondary | ICD-10-CM | POA: Diagnosis present

## 2018-10-14 DIAGNOSIS — E876 Hypokalemia: Secondary | ICD-10-CM | POA: Diagnosis present

## 2018-10-14 DIAGNOSIS — D72829 Elevated white blood cell count, unspecified: Secondary | ICD-10-CM | POA: Diagnosis present

## 2018-10-14 DIAGNOSIS — Z8249 Family history of ischemic heart disease and other diseases of the circulatory system: Secondary | ICD-10-CM

## 2018-10-14 DIAGNOSIS — I160 Hypertensive urgency: Principal | ICD-10-CM | POA: Diagnosis present

## 2018-10-14 DIAGNOSIS — S93601A Unspecified sprain of right foot, initial encounter: Secondary | ICD-10-CM

## 2018-10-14 DIAGNOSIS — Z9114 Patient's other noncompliance with medication regimen: Secondary | ICD-10-CM

## 2018-10-14 DIAGNOSIS — M79672 Pain in left foot: Secondary | ICD-10-CM | POA: Diagnosis present

## 2018-10-14 DIAGNOSIS — R079 Chest pain, unspecified: Secondary | ICD-10-CM

## 2018-10-14 DIAGNOSIS — M65871 Other synovitis and tenosynovitis, right ankle and foot: Secondary | ICD-10-CM | POA: Diagnosis present

## 2018-10-14 DIAGNOSIS — R51 Headache: Secondary | ICD-10-CM

## 2018-10-14 DIAGNOSIS — I119 Hypertensive heart disease without heart failure: Secondary | ICD-10-CM | POA: Diagnosis present

## 2018-10-14 DIAGNOSIS — R809 Proteinuria, unspecified: Secondary | ICD-10-CM | POA: Diagnosis present

## 2018-10-14 DIAGNOSIS — G43909 Migraine, unspecified, not intractable, without status migrainosus: Secondary | ICD-10-CM | POA: Diagnosis present

## 2018-10-14 DIAGNOSIS — R319 Hematuria, unspecified: Secondary | ICD-10-CM | POA: Diagnosis present

## 2018-10-14 DIAGNOSIS — R519 Headache, unspecified: Secondary | ICD-10-CM

## 2018-10-14 DIAGNOSIS — I169 Hypertensive crisis, unspecified: Secondary | ICD-10-CM | POA: Diagnosis present

## 2018-10-14 HISTORY — DX: Essential (primary) hypertension: I10

## 2018-10-14 LAB — CBC WITH DIFFERENTIAL/PLATELET
Abs Immature Granulocytes: 0.06 10*3/uL (ref 0.00–0.07)
Basophils Absolute: 0 10*3/uL (ref 0.0–0.1)
Basophils Relative: 0 %
EOS ABS: 0 10*3/uL (ref 0.0–0.5)
EOS PCT: 0 %
HEMATOCRIT: 36.9 % (ref 36.0–46.0)
Hemoglobin: 12.1 g/dL (ref 12.0–15.0)
IMMATURE GRANULOCYTES: 0 %
Lymphocytes Relative: 7 %
Lymphs Abs: 1 10*3/uL (ref 0.7–4.0)
MCH: 32.2 pg (ref 26.0–34.0)
MCHC: 32.8 g/dL (ref 30.0–36.0)
MCV: 98.1 fL (ref 80.0–100.0)
MONO ABS: 0.6 10*3/uL (ref 0.1–1.0)
MONOS PCT: 4 %
Neutro Abs: 12.7 10*3/uL — ABNORMAL HIGH (ref 1.7–7.7)
Neutrophils Relative %: 89 %
PLATELETS: 321 10*3/uL (ref 150–400)
RBC: 3.76 MIL/uL — ABNORMAL LOW (ref 3.87–5.11)
RDW: 18.3 % — AB (ref 11.5–15.5)
WBC: 14.4 10*3/uL — ABNORMAL HIGH (ref 4.0–10.5)
nRBC: 0 % (ref 0.0–0.2)

## 2018-10-14 LAB — URINALYSIS, ROUTINE W REFLEX MICROSCOPIC
BILIRUBIN URINE: NEGATIVE
GLUCOSE, UA: NEGATIVE mg/dL
Ketones, ur: NEGATIVE mg/dL
Leukocytes, UA: NEGATIVE
NITRITE: NEGATIVE
Protein, ur: 100 mg/dL — AB
SPECIFIC GRAVITY, URINE: 1.012 (ref 1.005–1.030)
pH: 8 (ref 5.0–8.0)

## 2018-10-14 LAB — HEPATIC FUNCTION PANEL
ALT: 23 U/L (ref 0–44)
AST: 30 U/L (ref 15–41)
Albumin: 4.4 g/dL (ref 3.5–5.0)
Alkaline Phosphatase: 73 U/L (ref 38–126)
BILIRUBIN DIRECT: 0.2 mg/dL (ref 0.0–0.2)
BILIRUBIN INDIRECT: 0.4 mg/dL (ref 0.3–0.9)
TOTAL PROTEIN: 8.5 g/dL — AB (ref 6.5–8.1)
Total Bilirubin: 0.6 mg/dL (ref 0.3–1.2)

## 2018-10-14 LAB — BASIC METABOLIC PANEL
ANION GAP: 11 (ref 5–15)
BUN: 10 mg/dL (ref 6–20)
CO2: 25 mmol/L (ref 22–32)
Calcium: 9.6 mg/dL (ref 8.9–10.3)
Chloride: 102 mmol/L (ref 98–111)
Creatinine, Ser: 0.85 mg/dL (ref 0.44–1.00)
GFR calc Af Amer: >60 mL/min (ref >60)
GFR calc non Af Amer: >60 mL/min (ref >60)
GLUCOSE: 106 mg/dL — AB (ref 70–99)
Potassium: 3.3 mmol/L — ABNORMAL LOW (ref 3.5–5.1)
Sodium: 138 mmol/L (ref 135–145)

## 2018-10-14 LAB — RAPID URINE DRUG SCREEN, HOSP PERFORMED
Amphetamines: NOT DETECTED
Barbiturates: NOT DETECTED
Benzodiazepines: NOT DETECTED
COCAINE: NOT DETECTED
Opiates: NOT DETECTED
Tetrahydrocannabinol: NOT DETECTED

## 2018-10-14 LAB — I-STAT BETA HCG BLOOD, ED (MC, WL, AP ONLY): I-stat hCG, quantitative: <5 m[IU]/mL (ref <5)

## 2018-10-14 LAB — I-STAT TROPONIN, ED: Troponin i, poc: 0 ng/mL (ref 0.00–0.08)

## 2018-10-14 LAB — BRAIN NATRIURETIC PEPTIDE: B NATRIURETIC PEPTIDE 5: 101.9 pg/mL — AB (ref 0.0–100.0)

## 2018-10-14 LAB — TSH: TSH: 1.163 u[IU]/mL (ref 0.350–4.500)

## 2018-10-14 MED ORDER — LABETALOL HCL 5 MG/ML IV SOLN
10.0000 mg | INTRAVENOUS | Status: DC | PRN
  Administered 2018-10-14 – 2018-10-17 (×5): 10 mg via INTRAVENOUS
  Filled 2018-10-14 (×7): qty 4

## 2018-10-14 MED ORDER — OXYCODONE-ACETAMINOPHEN 5-325 MG PO TABS
1.0000 | ORAL_TABLET | Freq: Once | ORAL | Status: AC
  Administered 2018-10-14: 1 via ORAL
  Filled 2018-10-14: qty 1

## 2018-10-14 MED ORDER — LISINOPRIL 10 MG PO TABS
10.0000 mg | ORAL_TABLET | Freq: Every day | ORAL | Status: DC
  Administered 2018-10-15 – 2018-10-18 (×4): 10 mg via ORAL
  Filled 2018-10-14 (×4): qty 1

## 2018-10-14 MED ORDER — ENOXAPARIN SODIUM 40 MG/0.4ML ~~LOC~~ SOLN
40.0000 mg | Freq: Every day | SUBCUTANEOUS | Status: DC
  Filled 2018-10-14: qty 0.4

## 2018-10-14 MED ORDER — MORPHINE SULFATE (PF) 4 MG/ML IV SOLN
4.0000 mg | Freq: Once | INTRAVENOUS | Status: AC
  Administered 2018-10-14: 4 mg via INTRAVENOUS
  Filled 2018-10-14: qty 1

## 2018-10-14 MED ORDER — ACETAMINOPHEN 650 MG RE SUPP: 650.0000 mg | Freq: Four times a day (QID) | RECTAL | Status: DC | PRN

## 2018-10-14 MED ORDER — LABETALOL HCL 5 MG/ML IV SOLN
20.0000 mg | Freq: Once | INTRAVENOUS | Status: AC
  Administered 2018-10-14: 20 mg via INTRAVENOUS
  Filled 2018-10-14: qty 4

## 2018-10-14 MED ORDER — NITROGLYCERIN 2 % TD OINT
1.0000 [in_us] | TOPICAL_OINTMENT | Freq: Once | TRANSDERMAL | Status: AC
  Administered 2018-10-14: 1 [in_us] via TOPICAL
  Filled 2018-10-14: qty 1

## 2018-10-14 MED ORDER — HYDROCHLOROTHIAZIDE 12.5 MG PO CAPS
25.0000 mg | ORAL_CAPSULE | Freq: Once | ORAL | Status: AC
  Administered 2018-10-14: 25 mg via ORAL
  Filled 2018-10-14: qty 2

## 2018-10-14 MED ORDER — LISINOPRIL 10 MG PO TABS
10.0000 mg | ORAL_TABLET | Freq: Once | ORAL | Status: AC
  Administered 2018-10-14: 10 mg via ORAL
  Filled 2018-10-14: qty 1

## 2018-10-14 MED ORDER — LABETALOL HCL 5 MG/ML IV SOLN
10.0000 mg | Freq: Once | INTRAVENOUS | Status: AC
  Administered 2018-10-14: 10 mg via INTRAVENOUS
  Filled 2018-10-14: qty 4

## 2018-10-14 MED ORDER — HYDROCHLOROTHIAZIDE 25 MG PO TABS
25.0000 mg | ORAL_TABLET | Freq: Every day | ORAL | Status: DC
  Administered 2018-10-15 – 2018-10-16 (×2): 25 mg via ORAL
  Filled 2018-10-14 (×3): qty 1

## 2018-10-14 MED ORDER — ACETAMINOPHEN 325 MG PO TABS
650.0000 mg | ORAL_TABLET | Freq: Four times a day (QID) | ORAL | Status: DC | PRN
  Administered 2018-10-16: 650 mg via ORAL
  Filled 2018-10-14: qty 2

## 2018-10-14 MED ORDER — POTASSIUM CHLORIDE CRYS ER 20 MEQ PO TBCR
20.0000 meq | EXTENDED_RELEASE_TABLET | Freq: Once | ORAL | Status: AC
  Administered 2018-10-14: 20 meq via ORAL
  Filled 2018-10-14: qty 1

## 2018-10-14 MED ORDER — ONDANSETRON HCL 4 MG PO TABS: 4.0000 mg | ORAL_TABLET | Freq: Four times a day (QID) | ORAL | Status: DC | PRN

## 2018-10-14 MED ORDER — ONDANSETRON HCL 4 MG/2ML IJ SOLN
4.0000 mg | Freq: Four times a day (QID) | INTRAMUSCULAR | Status: DC | PRN
  Administered 2018-10-15: 4 mg via INTRAVENOUS
  Filled 2018-10-14: qty 2

## 2018-10-14 MED ORDER — OXYCODONE-ACETAMINOPHEN 5-325 MG PO TABS
1.0000 | ORAL_TABLET | ORAL | Status: AC | PRN
  Administered 2018-10-14 – 2018-10-15 (×2): 2 via ORAL
  Administered 2018-10-15: 1 via ORAL
  Filled 2018-10-14: qty 1
  Filled 2018-10-14: qty 2
  Filled 2018-10-14 (×2): qty 1

## 2018-10-14 NOTE — ED Triage Notes (Addendum)
Pt BIBA from home. EMS call out was for generalized sickness. Pt states that her son played a trick on her, she went running and twisted right ankle and injured left foot. Pt did not take any pain medication but did ice ankle

## 2018-10-14 NOTE — H&P (Signed)
History and Physical    Grace Young ZOX:[id number] DOB: Jun 18, 1966 DOA: 10/14/2018  PCP: System, Pcp Not In  Patient coming from: Home.  Chief Complaint: Right ankle pain and swelling.  HPI: Grace Young is a 52 y.o. female with history of hypertension who was prescribed antihypertensives when patient visited ER on August 2 months ago and patient states she was taking medications until recently but ran out of it presents to the ER after patient slipped yesterday while playing with her son.  He sprained the ankle which has gradually worsening pain and unable to walk on it now over the last 24 hours and presented to the ER.  Patient states over the last couple of weeks patient has been having chest pain off and on lasting for few seconds each time retrosternal nonradiating mid global headache which has been going on for couple of months.  Denies any weakness of upper or lower extremities or any visual changes.  ED Course: In the ER patient then was found to be swollen x-rays do not show any fracture.  And patient is found to have markedly elevated blood pressure with systolic blood pressure around 230.  Patient was given IV labetalol and nitroglycerin patch and was given lisinopril 10 mg hydrochlorothiazide 25 following which blood pressure is presently 180 systolic and admitted for further observation given that patient also is complaining of chest pain off and on and headache.  CT head was unremarkable EKG was showing LVH.  Troponin is negative.  Review of Systems: As per HPI, rest all negative.   Past Medical History:  Diagnosis Date  . Hypertension     Past Surgical History:  Procedure Laterality Date  . cyst removal from uterus and ovaries    . TONSILLECTOMY       reports that she has never smoked. She has never used smokeless tobacco. She reports that she drinks about 1.0 standard drinks of alcohol per week. She reports that she does not use drugs.  No Known Allergies  Family  History  Problem Relation Age of Onset  . Hypertension Sister   . Breast cancer Cousin   . Diabetes Mellitus II Neg Hx     Prior to Admission medications   Medication Sig Start Date End Date Taking? Authorizing Provider  cephALEXin (KEFLEX) 500 MG capsule Take 1 capsule (500 mg total) by mouth 4 (four) times daily. Patient not taking: Reported on 10/14/2018 07/26/18   Dietrich Pates, PA-C  hydrochlorothiazide (HYDRODIURIL) 25 MG tablet Take 1 tablet (25 mg total) by mouth daily. 07/24/18 09/22/18  Liberty Handy, PA-C  HYDROcodone-acetaminophen (NORCO/VICODIN) 5-325 MG tablet Take 1 tablet by mouth every 6 (six) hours as needed. Patient not taking: Reported on 10/14/2018 07/26/18   Dietrich Pates, PA-C  potassium chloride SA (K-DUR,KLOR-CON) 20 MEQ tablet Take 1 tablet (20 mEq total) by mouth daily for 7 days. 07/26/18 08/02/18  Dietrich Pates, PA-C    Physical Exam: Vitals:   10/14/18 1904 10/14/18 2030 10/14/18 2152 10/14/18 2245  BP: (!) 257/136 (!) 242/145 (!) 186/130 (!) 199/122  Pulse: 98 (!) 105 97 89  Resp: 18 20 14 12   Temp:      TempSrc:      SpO2: 99% 97% 95% 94%  Weight:      Height:          Constitutional: Moderately built and nourished. Vitals:   10/14/18 1904 10/14/18 2030 10/14/18 2152 10/14/18 2245  BP: (!) 257/136 (!) 242/145 (!) 186/130 (!) 199/122  Pulse: 98 (!) 105 97 89  Resp: 18 20 14 12   Temp:      TempSrc:      SpO2: 99% 97% 95% 94%  Weight:      Height:       Eyes: Anicteric no pallor. ENMT: No discharge from the ears eyes nose or mouth. Neck: No JVD appreciated no mass felt. Respiratory: No rhonchi or crepitations. Cardiovascular: S1-S2 heard no murmurs appreciated. Abdomen: Soft nontender bowel sounds present. Musculoskeletal: No edema.  No joint effusion. Skin: No rash. Neurologic: Alert awake oriented to time place and person.  Moves all extremities. Psychiatric: Appears normal per normal affect.   Labs on Admission: I have personally  reviewed following labs and imaging studies  CBC: Recent Labs  Lab 10/14/18 1902  WBC 14.4*  NEUTROABS 12.7*  HGB 12.1  HCT 36.9  MCV 98.1  PLT 321   Basic Metabolic Panel: Recent Labs  Lab 10/14/18 1902  NA 138  K 3.3*  CL 102  CO2 25  GLUCOSE 106*  BUN 10  CREATININE 0.85  CALCIUM 9.6   GFR: Estimated Creatinine Clearance: 95.7 mL/min (by C-G formula based on SCr of 0.85 mg/dL). Liver Function Tests: Recent Labs  Lab 10/14/18 1902  AST 30  ALT 23  ALKPHOS 73  BILITOT 0.6  PROT 8.5*  ALBUMIN 4.4   No results for input(s): LIPASE, AMYLASE in the last 168 hours. No results for input(s): AMMONIA in the last 168 hours. Coagulation Profile: No results for input(s): INR, PROTIME in the last 168 hours. Cardiac Enzymes: No results for input(s): CKTOTAL, CKMB, CKMBINDEX, TROPONINI in the last 168 hours. BNP (last 3 results) No results for input(s): PROBNP in the last 8760 hours. HbA1C: No results for input(s): HGBA1C in the last 72 hours. CBG: No results for input(s): GLUCAP in the last 168 hours. Lipid Profile: No results for input(s): CHOL, HDL, LDLCALC, TRIG, CHOLHDL, LDLDIRECT in the last 72 hours. Thyroid Function Tests: Recent Labs    10/14/18 2055  TSH 1.163   Anemia Panel: No results for input(s): VITAMINB12, FOLATE, FERRITIN, TIBC, IRON, RETICCTPCT in the last 72 hours. Urine analysis:    Component Value Date/Time   COLORURINE YELLOW 10/14/2018 1750   APPEARANCEUR CLEAR 10/14/2018 1750   LABSPEC 1.012 10/14/2018 1750   PHURINE 8.0 10/14/2018 1750   GLUCOSEU NEGATIVE 10/14/2018 1750   HGBUR LARGE (A) 10/14/2018 1750   BILIRUBINUR NEGATIVE 10/14/2018 1750   KETONESUR NEGATIVE 10/14/2018 1750   PROTEINUR 100 (A) 10/14/2018 1750   NITRITE NEGATIVE 10/14/2018 1750   LEUKOCYTESUR NEGATIVE 10/14/2018 1750   Sepsis Labs: @LABRCNTIP (procalcitonin:4,lacticidven:4) )No results found for this or any previous visit (from the past 240 hour(s)).    Radiological Exams on Admission: Dg Chest 2 View  Result Date: 10/14/2018 CLINICAL DATA:  52 y/o F; subjective fever and shortness of breath for 1 day. EXAM: CHEST - 2 VIEW COMPARISON:  07/26/2018 CT chest. FINDINGS: The heart size and mediastinal contours are within normal limits given projection and technique. Left upper lobe platelike atelectasis. No consolidation, effusion, or pneumothorax. The visualized skeletal structures are unremarkable. IMPRESSION: No acute pulmonary process identified. Electronically Signed   By: Mitzi Hansen M.D.   On: 10/14/2018 19:23   Dg Ankle Complete Right  Result Date: 10/14/2018 CLINICAL DATA:  Twisting injury with lateral pain EXAM: RIGHT ANKLE - COMPLETE 3+ VIEW COMPARISON:  None. FINDINGS: There is lateral soft tissue swelling. No visible fracture or dislocation. No joint effusion. IMPRESSION: Lateral soft tissue  swelling.  No fracture. Electronically Signed   By: Paulina Fusi M.D.   On: 10/14/2018 15:54   Ct Head Wo Contrast  Result Date: 10/14/2018 CLINICAL DATA:  Elevated blood pressure EXAM: CT HEAD WITHOUT CONTRAST TECHNIQUE: Contiguous axial images were obtained from the base of the skull through the vertex without intravenous contrast. COMPARISON:  None. FINDINGS: Brain: No acute territorial infarction, hemorrhage, or intracranial mass is visualized. Mild atrophy. Mild to moderate small vessel ischemic changes of the white matter. Nonenlarged ventricles. Vascular: No hyperdense vessels.  No unexpected calcification Skull: Normal. Negative for fracture or focal lesion. Sinuses/Orbits: No acute finding. Other: None IMPRESSION: 1. No CT evidence for acute intracranial abnormality. 2. Small vessel ischemic changes of the white matter. Electronically Signed   By: Jasmine Pang M.D.   On: 10/14/2018 21:40   Dg Foot Complete Left  Result Date: 10/14/2018 CLINICAL DATA:  Twisting injury.  Lateral pain. EXAM: LEFT FOOT - COMPLETE 3+ VIEW  COMPARISON:  Ankle same day. FINDINGS: There is no evidence of fracture or dislocation. There is no evidence of arthropathy or other focal bone abnormality. Soft tissues are unremarkable. IMPRESSION: Negative. Electronically Signed   By: Paulina Fusi M.D.   On: 10/14/2018 15:55   Dg Foot Complete Right  Result Date: 10/14/2018 CLINICAL DATA:  Twisting injury.  Right foot pain. EXAM: RIGHT FOOT COMPLETE - 3+ VIEW COMPARISON:  10/14/2018 FINDINGS: No acute bony abnormality. Specifically, no fracture, subluxation, or dislocation. Joint spaces maintained. Soft tissues intact. IMPRESSION: No acute bony abnormality. Electronically Signed   By: Charlett Nose M.D.   On: 10/14/2018 19:25    EKG: Independently reviewed.  Normal sinus rhythm with LVH.  Assessment/Plan Principal Problem:   Hypertensive urgency Active Problems:   Right ankle pain    1. Hypertensive urgency -was prescribed antihypertensives 2 months ago which patient has stopped taking with no refills.  Presently patient has been started on lisinopril 10 mg and hydrochlorothiazide 25 mg and also was placed on nitroglycerin patch and PRN IV labetalol.  Closely follow blood pressure trends. 2. Right ankle pain likely from sprain.  X-rays do not show any definite fractures.  Since patient has significant pain will get MRI of the right ankle. 3. Chest pain -episodic last few seconds each time.  We will cycle cardiac markers check blood pressure in both arms and look for any differences more than 52 years old and will need CT chest. 4. Headache likely from hypertension.  Check sed rate. 5. Mild hypokalemia replace and recheck.   DVT prophylaxis: Lovenox. Code Status: Full code. Family Communication: Discussed with patient. Disposition Plan: Home. Consults called: None. Admission status: Observation.   Eduard Clos MD [facility] Pager [phone].  If 7PM-7AM, please contact night-coverage [email or web address] Password  [facility]  10/14/2018, 11:29 PM

## 2018-10-14 NOTE — ED Provider Notes (Addendum)
Medical screening examination/treatment/procedure(s) were conducted as a shared visit with non-physician practitioner(s) and myself.  I personally evaluated the patient during the encounter.  None As per PA-C note, patient initially presented for mechanical ankle sprain.  Most of her pain is in the forefoot on the right.  She reports is very painful to move.  She however has severely elevated blood pressure which is not being actively managed.  She reports that she has had medications that have gotten prescribed through the emergency department but ran out a few days ago.  She reports that the pressures were never actually controlled on the medication anyway.  She has been having escalating symptoms of persistent headaches worse over the past week with posterior persistent headache.  Also she is getting exertional shortness of breath or chest pain climbing stairs in her home.  Patient is alert and appropriate.  She is not confused.  Funduscopic exam undertaken but not with dilated pupils.  No obvious retinal hemorrhages.  Pupils are symmetric and responsive.  Heart borderline tachycardic.  No gross rub murmur gallop.  Lungs clear to auscultation.  Abdomen soft and nontender.  Patient has pain to palpation at particularily the right forefoot which is mild to moderately swollen.  Dorsalis pedis pulses are easily palpable in both feet.  The feet are warm and dry.  Calves are soft and nontender.  Patient blood pressure did not show improvement with oral medications and a 10 mg IV dose of labetalol.  Will re-dose labetalol 20 mg, apply Nitropaste, treat for foot pain and plan for admission for hypertensive urgency with headache and chest pain positive on review of systems.  Patient does not actively have chest pain, this is predominantly exertional.  She does have posterior headache.  She does not have confusion or visual changes.  Fundoscopic exam is  limited by nondilated pupils however no obvious retinal  hemorrhages.  CRITICAL CARE Performed by: Arby Barrette   Total critical care time: 30 minutes  Critical care time was exclusive of separately billable procedures and treating other patients.  Critical care was necessary to treat or prevent imminent or life-threatening deterioration.  Critical care was time spent personally by me on the following activities: development of treatment plan with patient and/or surrogate as well as nursing, discussions with consultants, evaluation of patient's response to treatment, examination of patient, obtaining history from patient or surrogate, ordering and performing treatments and interventions, ordering and review of laboratory studies, ordering and review of radiographic studies, pulse oximetry and re-evaluation of patient's condition.   Arby Barrette, MD 10/14/18 2223    Arby Barrette, MD 10/14/18 2223

## 2018-10-14 NOTE — ED Notes (Signed)
ED TO INPATIENT HANDOFF REPORT  Name/Age/Gender Grace Young 52 y.o. female  Code Status   Home/SNF/Other Home  Chief Complaint Right ankle pain, left foot pain  Level of Care/Admitting Diagnosis ED Disposition    ED Disposition Condition [facility] Area: [place] [100102]  Level of Care: Stepdown [14]  Admit to SDU based on following criteria: Cardiac Instability:  Patients experiencing chest pain, unconfirmed MI and stable, arrhythmias and CHF requiring medical management and potentially compromising patient's stability  Diagnosis: Hypertensive urgency [916384]  Admitting Physician: Rise Patience [phone]  Attending Physician: Rise Patience [[id number]]  PT Class (Do Not Modify): Observation [104]  PT Acc Code (Do Not Modify): Observation [10022]       Medical History History reviewed. No pertinent past medical history.  Allergies No Known Allergies  IV Location/Drains/Wounds Patient Lines/Drains/Airways Status   Active Line/Drains/Airways    Name:   Placement date:   Placement time:   Site:   Days:   Peripheral IV 10/14/18 Left Antecubital   10/14/18    2035    Antecubital   less than 1          Labs/Imaging Results for orders placed or performed during the hospital encounter of 10/14/18 (from the past 48 hour(s))  Urinalysis, Routine w reflex microscopic     Status: Abnormal   Collection Time: 10/14/18  5:50 PM  Result Value Ref Range   Color, Urine YELLOW YELLOW   APPearance CLEAR CLEAR   Specific Gravity, Urine 1.012 1.005 - 1.030   pH 8.0 5.0 - 8.0   Glucose, UA NEGATIVE NEGATIVE mg/dL   Hgb urine dipstick LARGE (A) NEGATIVE   Bilirubin Urine NEGATIVE NEGATIVE   Ketones, ur NEGATIVE NEGATIVE mg/dL   Protein, ur 100 (A) NEGATIVE mg/dL   Nitrite NEGATIVE NEGATIVE   Leukocytes, UA NEGATIVE NEGATIVE   RBC / HPF 0-5 0 - 5 RBC/hpf   WBC, UA 11-20 0 - 5 WBC/hpf   Bacteria, UA RARE (A) NONE SEEN   Squamous  Epithelial / LPF 0-5 0 - 5    Comment: Performed at [facility], [place] [address], [place], [place] [address]  CBC with Differential     Status: Abnormal   Collection Time: 10/14/18  7:02 PM  Result Value Ref Range   WBC 14.4 (H) 4.0 - 10.5 K/uL   RBC 3.76 (L) 3.87 - 5.11 MIL/uL   Hemoglobin 12.1 12.0 - 15.0 g/dL   HCT 36.9 36.0 - 46.0 %   MCV 98.1 80.0 - 100.0 fL   MCH 32.2 26.0 - 34.0 pg   MCHC 32.8 30.0 - 36.0 g/dL   RDW 18.3 (H) 11.5 - 15.5 %   Platelets 321 150 - 400 K/uL   nRBC 0.0 0.0 - 0.2 %   Neutrophils Relative % 89 %   Neutro Abs 12.7 (H) 1.7 - 7.7 K/uL   Lymphocytes Relative 7 %   Lymphs Abs 1.0 0.7 - 4.0 K/uL   Monocytes Relative 4 %   Monocytes Absolute 0.6 0.1 - 1.0 K/uL   Eosinophils Relative 0 %   Eosinophils Absolute 0.0 0.0 - 0.5 K/uL   Basophils Relative 0 %   Basophils Absolute 0.0 0.0 - 0.1 K/uL   Immature Granulocytes 0 %   Abs Immature Granulocytes 0.06 0.00 - 0.07 K/uL    Comment: Performed at [facility], [place] [address], [place], [place] [address]  Basic metabolic panel  Status: Abnormal   Collection Time: 10/14/18  7:02 PM  Result Value Ref Range   Sodium 138 135 - 145 mmol/L   Potassium 3.3 (L) 3.5 - 5.1 mmol/L   Chloride 102 98 - 111 mmol/L   CO2 25 22 - 32 mmol/L   Glucose, Bld 106 (H) 70 - 99 mg/dL   BUN 10 6 - 20 mg/dL   Creatinine, Ser 0.85 0.44 - 1.00 mg/dL   Calcium 9.6 8.9 - 10.3 mg/dL   GFR calc non Af Amer >60 >60 mL/min   GFR calc Af Amer >60 >60 mL/min    Comment: (NOTE) The eGFR has been calculated using the CKD EPI equation. This calculation has not been validated in all clinical situations. eGFR's persistently <60 mL/min signify possible Chronic Kidney Disease.    Anion gap 11 5 - 15    Comment: Performed at [facility], [place] [address], [place], [place] [address]  Hepatic function panel     Status: Abnormal   Collection Time: 10/14/18  7:02 PM  Result Value Ref  Range   Total Protein 8.5 (H) 6.5 - 8.1 g/dL   Albumin 4.4 3.5 - 5.0 g/dL   AST 30 15 - 41 U/L   ALT 23 0 - 44 U/L   Alkaline Phosphatase 73 38 - 126 U/L   Total Bilirubin 0.6 0.3 - 1.2 mg/dL   Bilirubin, Direct 0.2 0.0 - 0.2 mg/dL   Indirect Bilirubin 0.4 0.3 - 0.9 mg/dL    Comment: Performed at [facility], [place] [address], [place], [place] [address]  Brain natriuretic peptide     Status: Abnormal   Collection Time: 10/14/18  7:02 PM  Result Value Ref Range   B Natriuretic Peptide 101.9 (H) 0.0 - 100.0 pg/mL    Comment: Performed at [facility], [place] [address] [place], [place] [address]  I-Stat Troponin, ED (not at [facility])     Status: None   Collection Time: 10/14/18  7:11 PM  Result Value Ref Range   Troponin i, poc 0.00 0.00 - 0.08 ng/mL   Comment 3            Comment: Due to the release kinetics of cTnI, a negative result within the first hours of the onset of symptoms does not rule out myocardial infarction with certainty. If myocardial infarction is still suspected, repeat the test at appropriate intervals.   I-Stat Beta hCG blood, ED ([facility], [facility], [facility] only)     Status: None   Collection Time: 10/14/18  7:11 PM  Result Value Ref Range   I-stat hCG, quantitative <5.0 <5 mIU/mL   Comment 3            Comment:   GEST. AGE      CONC.  (mIU/mL)   <=1 WEEK        5 - 50     2 WEEKS       50 - 500     3 WEEKS       100 - 10,000     4 WEEKS     1,000 - 30,000        FEMALE AND NON-PREGNANT FEMALE:     LESS THAN 5 mIU/mL   Urine rapid drug screen (hosp performed)     Status: None   Collection Time: 10/14/18  7:37 PM  Result Value Ref Range   Opiates NONE DETECTED NONE DETECTED   Cocaine NONE DETECTED NONE DETECTED  Benzodiazepines NONE DETECTED NONE DETECTED   Amphetamines NONE DETECTED NONE DETECTED   Tetrahydrocannabinol NONE DETECTED NONE DETECTED   Barbiturates NONE DETECTED NONE DETECTED    Comment: (NOTE) DRUG SCREEN FOR MEDICAL  PURPOSES ONLY.  IF CONFIRMATION IS NEEDED FOR ANY PURPOSE, NOTIFY LAB WITHIN 5 DAYS. LOWEST DETECTABLE LIMITS FOR URINE DRUG SCREEN Drug Class                     Cutoff (ng/mL) Amphetamine and metabolites    1000 Barbiturate and metabolites    200 Benzodiazepine                 062 Tricyclics and metabolites     300 Opiates and metabolites        300 Cocaine and metabolites        300 THC                            50 Performed at [facility], [place] [address], [place], [place] [address]   TSH     Status: None   Collection Time: 10/14/18  8:55 PM  Result Value Ref Range   TSH 1.163 0.350 - 4.500 uIU/mL    Comment: Performed by a 3rd Generation assay with a functional sensitivity of <=0.01 uIU/mL. Performed at [facility], [place] [address] [place], [place] [address]    Dg Chest 2 View  Result Date: 10/14/2018 CLINICAL DATA:  52 y/o F; subjective fever and shortness of breath for 1 day. EXAM: CHEST - 2 VIEW COMPARISON:  07/26/2018 CT chest. FINDINGS: The heart size and mediastinal contours are within normal limits given projection and technique. Left upper lobe platelike atelectasis. No consolidation, effusion, or pneumothorax. The visualized skeletal structures are unremarkable. IMPRESSION: No acute pulmonary process identified. Electronically Signed   By: Kristine Garbe M.D.   On: 10/14/2018 19:23   Dg Ankle Complete Right  Result Date: 10/14/2018 CLINICAL DATA:  Twisting injury with lateral pain EXAM: RIGHT ANKLE - COMPLETE 3+ VIEW COMPARISON:  None. FINDINGS: There is lateral soft tissue swelling. No visible fracture or dislocation. No joint effusion. IMPRESSION: Lateral soft tissue swelling.  No fracture. Electronically Signed   By: Nelson Chimes M.D.   On: 10/14/2018 15:54   Ct Head Wo Contrast  Result Date: 10/14/2018 CLINICAL DATA:  Elevated blood pressure EXAM: CT HEAD WITHOUT CONTRAST TECHNIQUE: Contiguous axial images were  obtained from the base of the skull through the vertex without intravenous contrast. COMPARISON:  None. FINDINGS: Brain: No acute territorial infarction, hemorrhage, or intracranial mass is visualized. Mild atrophy. Mild to moderate small vessel ischemic changes of the white matter. Nonenlarged ventricles. Vascular: No hyperdense vessels.  No unexpected calcification Skull: Normal. Negative for fracture or focal lesion. Sinuses/Orbits: No acute finding. Other: None IMPRESSION: 1. No CT evidence for acute intracranial abnormality. 2. Small vessel ischemic changes of the white matter. Electronically Signed   By: Donavan Foil M.D.   On: 10/14/2018 21:40   Dg Foot Complete Left  Result Date: 10/14/2018 CLINICAL DATA:  Twisting injury.  Lateral pain. EXAM: LEFT FOOT - COMPLETE 3+ VIEW COMPARISON:  Ankle same day. FINDINGS: There is no evidence of fracture or dislocation. There is no evidence of arthropathy or other focal bone abnormality. Soft tissues are unremarkable. IMPRESSION: Negative. Electronically Signed   By: Nelson Chimes M.D.   On: 10/14/2018 15:55   Dg Foot Complete Right  Result  Date: 10/14/2018 CLINICAL DATA:  Twisting injury.  Right foot pain. EXAM: RIGHT FOOT COMPLETE - 3+ VIEW COMPARISON:  10/14/2018 FINDINGS: No acute bony abnormality. Specifically, no fracture, subluxation, or dislocation. Joint spaces maintained. Soft tissues intact. IMPRESSION: No acute bony abnormality. Electronically Signed   By: Rolm Baptise M.D.   On: 10/14/2018 19:25   None  Pending Labs Unresulted Labs (From admission, onward)   None      Vitals/Pain Today's Vitals   10/14/18 2030 10/14/18 2039 10/14/18 2152 10/14/18 2245  BP: (!) 242/145  (!) 186/130 (!) 199/122  Pulse: (!) 105  97 89  Resp: 20  14 12   Temp:      TempSrc:      SpO2: 97%  95% 94%  Weight:      Height:      PainSc:  8       Isolation Precautions No active isolations  Medications Medications  lisinopril (PRINIVIL,ZESTRIL)  tablet 10 mg (10 mg Oral Given 10/14/18 1851)  hydrochlorothiazide (MICROZIDE) capsule 25 mg (25 mg Oral Given 10/14/18 1852)  oxyCODONE-acetaminophen (PERCOCET/ROXICET) 5-325 MG per tablet 1 tablet (1 tablet Oral Given 10/14/18 1852)  labetalol (NORMODYNE,TRANDATE) injection 10 mg (10 mg Intravenous Given 10/14/18 2036)  labetalol (NORMODYNE,TRANDATE) injection 20 mg (20 mg Intravenous Given 10/14/18 2212)  nitroGLYCERIN (NITROGLYN) 2 % ointment 1 inch (1 inch Topical Given 10/14/18 2212)  morphine 4 MG/ML injection 4 mg (4 mg Intravenous Given 10/14/18 2240)    Mobility Walks normally. Unable to bare weight on right ankle currently

## 2018-10-14 NOTE — ED Provider Notes (Addendum)
[facility]-EMERGENCY DEPT Provider Note   CSN: [id number] Arrival date & time: 10/14/18  1503     History   Chief Complaint Chief Complaint  Patient presents with  . Ankle Pain  . Foot Pain    HPI Grace Young is a 52 y.o. female.  HPI   Pt is a 52 y/o female with a history of chronic untreated hypertension who presents emergency department today complaining of right ankle and foot pain that began just prior to arrival.  Also complaining of left foot pain.  She states that her son was playing a trick on her the fake dead rat, she became scared and ended up twisting her right ankle and hurting her left foot as well.  States she did not hit her head or pass out.  She is complaining of severe constant pain to the right foot/ankle and mild constant pain to the left foot.  Denies any other injuries.  After triage, patient was noted to be very hypertensive.  She admits that she recently had a prescription for blood pressure medications for 2 months but she has yet to follow-up with her PCP so she has not been taking any medications.  She admits that she has been having constant frontal headaches for the last several months.  Also she states that for about the last 2 weeks she is also had more severe headaches to the posterior aspect of her head that radiates to her neck and shoulders.  Has had intermittent dizziness.  Also complains of intermittent blurry vision.  No numbness or weakness to the arms or legs.  States prior to her injury she was ambulatory with steady gait.  She also states that she has been having intermittent chest pain associated with exertion.  States that when she walks up the stairs in her house she has chest pain.  She states chest pain goes away after resting for a period of time.  She has shortness of breath associated with this chest pain.  No significant swelling to the bilateral lower extremities.  History reviewed. No pertinent past medical  history.  Patient Active Problem List   Diagnosis Date Noted  . Hypertensive urgency 10/14/2018    History reviewed. No pertinent surgical history.   OB History   None      Home Medications    Prior to Admission medications   Medication Sig Start Date End Date Taking? Authorizing Provider  cephALEXin (KEFLEX) 500 MG capsule Take 1 capsule (500 mg total) by mouth 4 (four) times daily. Patient not taking: Reported on 10/14/2018 07/26/18   Dietrich Pates, PA-C  hydrochlorothiazide (HYDRODIURIL) 25 MG tablet Take 1 tablet (25 mg total) by mouth daily. 07/24/18 09/22/18  Liberty Handy, PA-C  HYDROcodone-acetaminophen (NORCO/VICODIN) 5-325 MG tablet Take 1 tablet by mouth every 6 (six) hours as needed. Patient not taking: Reported on 10/14/2018 07/26/18   Dietrich Pates, PA-C  potassium chloride SA (K-DUR,KLOR-CON) 20 MEQ tablet Take 1 tablet (20 mEq total) by mouth daily for 7 days. 07/26/18 08/02/18  Dietrich Pates, PA-C    Family History No family history on file.  Social History Social History   Tobacco Use  . Smoking status: Never Smoker  . Smokeless tobacco: Never Used  Substance Use Topics  . Alcohol use: Yes    Alcohol/week: 1.0 standard drinks    Types: 1 Shots of liquor per week  . Drug use: No     Allergies   Patient has no known allergies.  Review of Systems Review of Systems  Constitutional: Negative for chills and fever.  HENT: Negative for congestion.   Eyes: Positive for visual disturbance. Negative for photophobia.  Respiratory: Positive for shortness of breath.   Cardiovascular: Positive for chest pain. Negative for leg swelling.  Gastrointestinal: Negative for abdominal pain, constipation, diarrhea, nausea and vomiting.  Genitourinary: Negative for flank pain.  Musculoskeletal: Negative for back pain.  Skin: Negative for rash.  Neurological: Positive for dizziness and headaches. Negative for weakness, light-headedness and numbness.     Physical  Exam Updated Vital Signs BP (!) 186/130   Pulse 97   Temp 99.7 F (37.6 C) (Oral)   Resp 14   Ht 5\' 8"  (1.727 m)   Wt 99.8 kg   LMP 10/14/2018   SpO2 95%   BMI 33.45 kg/m   Physical Exam  Constitutional: She appears well-developed and well-nourished. She appears distressed.  HENT:  Head: Normocephalic and atraumatic.  Eyes: Pupils are equal, round, and reactive to light. Conjunctivae are normal.  fatigable nystagmus to the left. No vertical or bilateral horizontal nystagmus.  Neck: Neck supple.  Cardiovascular: Regular rhythm and normal heart sounds.  No murmur heard. Tachycardic.  Pulmonary/Chest: Effort normal and breath sounds normal. No stridor. No respiratory distress. She has no wheezes.  Abdominal: Soft. Bowel sounds are normal. She exhibits no distension. There is no tenderness. There is no guarding.  Musculoskeletal: She exhibits no edema.  Swelling noted to the right hindfoot with tenderness to palpation.  No significant tenderness to the medial lateral malleolus.  Achilles tendon is intact.  Unable to dorsiflex or plantarflex foot due to pain.  Tenderness along the lateral aspect of the left foot with no obvious deformity. No TTP throughout the left ankle.  Distal pulses intact bilaterally.  Full range of motion of the left foot.  Mild tenderness to the bilateral cervical paraspinous muscles and bilateral shoulder muscles.  Neurological: She is alert.  Mental Status:  Alert, thought content appropriate, able to give a coherent history. Speech fluent without evidence of aphasia. Able to follow 2 step commands without difficulty.  Cranial Nerves:  II:  Peripheral visual fields grossly normal, pupils equal, round, reactive to light III,IV, VI: extra-ocular motions intact bilaterally  V,VII: smile symmetric, facial light touch sensation equal VIII: hearing grossly normal to voice  X: uvula elevates symmetrically  XI: bilateral shoulder shrug symmetric and strong XII:  midline tongue extension without fassiculations Motor:  Normal tone.  5/5 strength to bilateral upper extremities.  5/5 strength to left lower extremity.  Decreased strength to right lower extremity secondary to pain in the right thigh and right ankle/foot.  Normal sensation throughout. Sensory: light touch normal in all extremities. Gait: Gait not tested due to right lower extremity injury. CV: 2+ radial and PT pulses  Skin: Skin is warm and dry.  Psychiatric: She has a normal mood and affect.  Nursing note and vitals reviewed.    ED Treatments / Results  Labs (all labs ordered are listed, but only abnormal results are displayed) Labs Reviewed  CBC WITH DIFFERENTIAL/PLATELET - Abnormal; Notable for the following components:      Result Value   WBC 14.4 (*)    RBC 3.76 (*)    RDW 18.3 (*)    Neutro Abs 12.7 (*)    All other components within normal limits  BASIC METABOLIC PANEL - Abnormal; Notable for the following components:   Potassium 3.3 (*)    Glucose, Bld 106 (*)  All other components within normal limits  URINALYSIS, ROUTINE W REFLEX MICROSCOPIC - Abnormal; Notable for the following components:   Hgb urine dipstick LARGE (*)    Protein, ur 100 (*)    Bacteria, UA RARE (*)    All other components within normal limits  HEPATIC FUNCTION PANEL - Abnormal; Notable for the following components:   Total Protein 8.5 (*)    All other components within normal limits  BRAIN NATRIURETIC PEPTIDE - Abnormal; Notable for the following components:   B Natriuretic Peptide 101.9 (*)    All other components within normal limits  TSH  RAPID URINE DRUG SCREEN, HOSP PERFORMED  I-STAT TROPONIN, ED  I-STAT BETA HCG BLOOD, ED ([facility], [facility], [facility] ONLY)    EKG None  Radiology Dg Chest 2 View  Result Date: 10/14/2018 CLINICAL DATA:  52 y/o F; subjective fever and shortness of breath for 1 day. EXAM: CHEST - 2 VIEW COMPARISON:  07/26/2018 CT chest. FINDINGS: The heart size and mediastinal  contours are within normal limits given projection and technique. Left upper lobe platelike atelectasis. No consolidation, effusion, or pneumothorax. The visualized skeletal structures are unremarkable. IMPRESSION: No acute pulmonary process identified. Electronically Signed   By: Mitzi Hansen M.D.   On: 10/14/2018 19:23   Dg Ankle Complete Right  Result Date: 10/14/2018 CLINICAL DATA:  Twisting injury with lateral pain EXAM: RIGHT ANKLE - COMPLETE 3+ VIEW COMPARISON:  None. FINDINGS: There is lateral soft tissue swelling. No visible fracture or dislocation. No joint effusion. IMPRESSION: Lateral soft tissue swelling.  No fracture. Electronically Signed   By: Paulina Fusi M.D.   On: 10/14/2018 15:54   Ct Head Wo Contrast  Result Date: 10/14/2018 CLINICAL DATA:  Elevated blood pressure EXAM: CT HEAD WITHOUT CONTRAST TECHNIQUE: Contiguous axial images were obtained from the base of the skull through the vertex without intravenous contrast. COMPARISON:  None. FINDINGS: Brain: No acute territorial infarction, hemorrhage, or intracranial mass is visualized. Mild atrophy. Mild to moderate small vessel ischemic changes of the white matter. Nonenlarged ventricles. Vascular: No hyperdense vessels.  No unexpected calcification Skull: Normal. Negative for fracture or focal lesion. Sinuses/Orbits: No acute finding. Other: None IMPRESSION: 1. No CT evidence for acute intracranial abnormality. 2. Small vessel ischemic changes of the white matter. Electronically Signed   By: Jasmine Pang M.D.   On: 10/14/2018 21:40   Dg Foot Complete Left  Result Date: 10/14/2018 CLINICAL DATA:  Twisting injury.  Lateral pain. EXAM: LEFT FOOT - COMPLETE 3+ VIEW COMPARISON:  Ankle same day. FINDINGS: There is no evidence of fracture or dislocation. There is no evidence of arthropathy or other focal bone abnormality. Soft tissues are unremarkable. IMPRESSION: Negative. Electronically Signed   By: Paulina Fusi M.D.   On:  10/14/2018 15:55   Dg Foot Complete Right  Result Date: 10/14/2018 CLINICAL DATA:  Twisting injury.  Right foot pain. EXAM: RIGHT FOOT COMPLETE - 3+ VIEW COMPARISON:  10/14/2018 FINDINGS: No acute bony abnormality. Specifically, no fracture, subluxation, or dislocation. Joint spaces maintained. Soft tissues intact. IMPRESSION: No acute bony abnormality. Electronically Signed   By: Charlett Nose M.D.   On: 10/14/2018 19:25    Procedures Procedures (including critical care time) CRITICAL CARE Performed by: Karrie Meres   Total critical care time: 36 minutes  Critical care time was exclusive of separately billable procedures and treating other patients.  Critical care was necessary to treat or prevent imminent or life-threatening deterioration.  Critical care was time spent personally by  me on the following activities: development of treatment plan with patient and/or surrogate as well as nursing, discussions with consultants, evaluation of patient's response to treatment, examination of patient, obtaining history from patient or surrogate, ordering and performing treatments and interventions, ordering and review of laboratory studies, ordering and review of radiographic studies, pulse oximetry and re-evaluation of patient's condition.   Medications Ordered in ED Medications  lisinopril (PRINIVIL,ZESTRIL) tablet 10 mg (10 mg Oral Given 10/14/18 1851)  hydrochlorothiazide (MICROZIDE) capsule 25 mg (25 mg Oral Given 10/14/18 1852)  oxyCODONE-acetaminophen (PERCOCET/ROXICET) 5-325 MG per tablet 1 tablet (1 tablet Oral Given 10/14/18 1852)  labetalol (NORMODYNE,TRANDATE) injection 10 mg (10 mg Intravenous Given 10/14/18 2036)  labetalol (NORMODYNE,TRANDATE) injection 20 mg (20 mg Intravenous Given 10/14/18 2212)  nitroGLYCERIN (NITROGLYN) 2 % ointment 1 inch (1 inch Topical Given 10/14/18 2212)     Initial Impression / Assessment and Plan / ED Course  I have reviewed the triage vital  signs and the nursing notes.  Pertinent labs & imaging results that were available during my care of the patient were reviewed by me and considered in my medical decision making (see chart for details).   Final Clinical Impressions(s) / ED Diagnoses   Final diagnoses:  None   Patient presenting for evaluation of right ankle and foot pain after mechanical fall prior to arrival.  X-rays of the right ankle/foot and left foot are negative for acute bony abnormality.    At triage patient noted to be severely hypertensive.  Does have history of this and it is currently untreated.  Is having persistent daily headaches that have been worse recently since running out of her blood pressure medicines.  Intermittent vision changes and dizziness as well.  Also with exertional chest pain associated with SOB.  No focal neuro deficits. Cardiac and pulmonary exams WNL.   CBC with leukocytosis of 14.4.  No anemia.  Normal kidney and liver function.  Slightly hypokalemic.  BNP is benign.  Troponin negative.  UDS negative.  UA with proteinuria and hematuria.  TSH is currently pending.  Chest x-ray with no cardiomegaly, pneumothorax or pneumonia.  No widened mediastinum.  EKG with normal sinus rhythm, heart rate 96.  Likely left atrial enlargement and left ventricular hypertrophy.  No ischemic changes.  Patient's pain and blood pressure treated initially with Percocet, hydrochlorthiazide and lisinopril with no significant improvement.   Patient evaluated by Dr. Donnald Garre who recommends admission. Pt care was signed out to Dr. Donnald Garre, pending TSH.  She will follow-up on lab work and we will plan for admission for hypertensive urgency and exertional chest pain.  ED Discharge Orders    None       Karrie Meres, PA-C 10/14/18 2211    Karrie Meres, PA-C 10/14/18 2216    Arby Barrette, MD 10/15/18 1650

## 2018-10-15 ENCOUNTER — Other Ambulatory Visit: Payer: Self-pay

## 2018-10-15 ENCOUNTER — Observation Stay (HOSPITAL_COMMUNITY): Payer: Self-pay

## 2018-10-15 DIAGNOSIS — I169 Hypertensive crisis, unspecified: Secondary | ICD-10-CM | POA: Diagnosis present

## 2018-10-15 LAB — CBC
HCT: 33.2 % — ABNORMAL LOW (ref 36.0–46.0)
Hemoglobin: 10.8 g/dL — ABNORMAL LOW (ref 12.0–15.0)
MCH: 32.4 pg (ref 26.0–34.0)
MCHC: 32.5 g/dL (ref 30.0–36.0)
MCV: 99.7 fL (ref 80.0–100.0)
PLATELETS: 257 10*3/uL (ref 150–400)
RBC: 3.33 MIL/uL — ABNORMAL LOW (ref 3.87–5.11)
RDW: 18.5 % — AB (ref 11.5–15.5)
WBC: 11.1 10*3/uL — AB (ref 4.0–10.5)
nRBC: 0 % (ref 0.0–0.2)

## 2018-10-15 LAB — TROPONIN I: Troponin I: <0.03 ng/mL (ref <0.03)

## 2018-10-15 LAB — BASIC METABOLIC PANEL
Anion gap: 11 (ref 5–15)
BUN: 11 mg/dL (ref 6–20)
CALCIUM: 8.9 mg/dL (ref 8.9–10.3)
CO2: 26 mmol/L (ref 22–32)
CREATININE: 0.83 mg/dL (ref 0.44–1.00)
Chloride: 98 mmol/L (ref 98–111)
GFR calc Af Amer: >60 mL/min (ref >60)
GLUCOSE: 123 mg/dL — AB (ref 70–99)
Potassium: 3 mmol/L — ABNORMAL LOW (ref 3.5–5.1)
SODIUM: 135 mmol/L (ref 135–145)

## 2018-10-15 LAB — MAGNESIUM: Magnesium: 1.5 mg/dL — ABNORMAL LOW (ref 1.7–2.4)

## 2018-10-15 LAB — MRSA PCR SCREENING: MRSA by PCR: NEGATIVE

## 2018-10-15 LAB — SEDIMENTATION RATE: Sed Rate: 29 mm/hr — ABNORMAL HIGH (ref 0–22)

## 2018-10-15 LAB — HIV ANTIBODY (ROUTINE TESTING W REFLEX): HIV SCREEN 4TH GENERATION: NONREACTIVE

## 2018-10-15 MED ORDER — POTASSIUM CHLORIDE CRYS ER 20 MEQ PO TBCR
40.0000 meq | EXTENDED_RELEASE_TABLET | Freq: Once | ORAL | Status: AC
  Administered 2018-10-15: 40 meq via ORAL
  Filled 2018-10-15: qty 2

## 2018-10-15 MED ORDER — CLONIDINE HCL 0.1 MG PO TABS
0.2000 mg | ORAL_TABLET | Freq: Once | ORAL | Status: AC
  Administered 2018-10-15: 0.2 mg via ORAL
  Filled 2018-10-15: qty 2

## 2018-10-15 MED ORDER — AMLODIPINE BESYLATE 5 MG PO TABS
5.0000 mg | ORAL_TABLET | Freq: Every day | ORAL | Status: DC
  Administered 2018-10-15 – 2018-10-18 (×4): 5 mg via ORAL
  Filled 2018-10-15 (×4): qty 1

## 2018-10-15 MED ORDER — IBUPROFEN 800 MG PO TABS
800.0000 mg | ORAL_TABLET | Freq: Four times a day (QID) | ORAL | Status: DC
  Administered 2018-10-15 – 2018-10-17 (×10): 800 mg via ORAL
  Filled 2018-10-15 (×10): qty 1

## 2018-10-15 MED ORDER — KETOROLAC TROMETHAMINE 30 MG/ML IJ SOLN
30.0000 mg | Freq: Once | INTRAMUSCULAR | Status: AC
  Administered 2018-10-15: 30 mg via INTRAVENOUS
  Filled 2018-10-15: qty 1

## 2018-10-15 MED ORDER — MAGNESIUM OXIDE 400 (241.3 MG) MG PO TABS
400.0000 mg | ORAL_TABLET | Freq: Two times a day (BID) | ORAL | Status: DC
  Administered 2018-10-15 – 2018-10-18 (×7): 400 mg via ORAL
  Filled 2018-10-15 (×7): qty 1

## 2018-10-15 MED ORDER — OXYCODONE HCL 5 MG PO TABS
5.0000 mg | ORAL_TABLET | Freq: Once | ORAL | Status: AC
  Administered 2018-10-15: 5 mg via ORAL
  Filled 2018-10-15 (×2): qty 1

## 2018-10-15 MED ORDER — OXYCODONE-ACETAMINOPHEN 5-325 MG PO TABS
2.0000 | ORAL_TABLET | Freq: Once | ORAL | Status: AC
  Administered 2018-10-15: 2 via ORAL
  Filled 2018-10-15: qty 2

## 2018-10-15 MED ORDER — GUAIFENESIN-DM 100-10 MG/5ML PO SYRP
5.0000 mL | ORAL_SOLUTION | ORAL | Status: DC | PRN
  Administered 2018-10-15: 5 mL via ORAL
  Filled 2018-10-15: qty 10

## 2018-10-15 MED ORDER — MAGNESIUM SULFATE 2 GM/50ML IV SOLN
2.0000 g | Freq: Once | INTRAVENOUS | Status: AC
  Administered 2018-10-15: 2 g via INTRAVENOUS
  Filled 2018-10-15: qty 50

## 2018-10-15 NOTE — Progress Notes (Addendum)
[facility] PROGRESS NOTE  Grace Young ZOX:[id number] DOB: [birth year] DOA: 10/14/2018 PCP: System, Pcp Not In   Narrative: 52 year old African-American female Not taking blood pressure meds-states has been on multiple meds and they "just do not work" Found to be very hypertensive after falling and hurting her ankle Chronic migraines and headaches in addition to dizziness She has been seen in the emergency room for left ankle pain in the past--she was last seen for this 8/3 and ultrasound of leg was negative She was admitted for hypertensive urgency and acute right ankle pain after fall   A & Plan Acute right ankle pain-MRI performed-formal read is pending, per my review I do not see any acute bony abnormality although I am not able to comment on ligaments and I discussed this clearly with the patient-pain control ibuprofen high-dose 800 mg 3 times daily in addition to Percocet She will need ambulation and I will have therapy work with her and order a cam walker boot Sed rate is elevated slightly at 29 but this is nonspecific  Leukocytosis no overt source-reassess labs a.m.  Hypertensive urgency blood pressure is better controlled in the 160 range-augment treatment with amlodipine 5 in addition to her other meds  Hypokalemia potassium 3.0 magnesium 1.5 replacing both  Poor sleep at night-habitus of [facility]-if she stays overnight for another midnight she will probably need nightly desat screen as I suspect 1 of the reasons of her poor control of blood pressure is undiagnosed sleep apnea  BMI >32-outpatient weight loss  Severity of Illness: The appropriate patient status for this patient is INPATIENT. Inpatient status is judged to be reasonable and necessary in order to provide the required intensity of service to ensure the patient's safety. The patient's presenting symptoms, physical exam findings, and initial radiographic and laboratory data in the context of their chronic  comorbidities is felt to place them at high risk for further clinical deterioration. Furthermore, it is not anticipated that the patient will be medically stable for discharge from the hospital within 2 midnights of admission. The following factors support the patient status of inpatient.   " The patient's presenting symptoms include electrolyte disturbances and acute ankle pain precluding walking. " The worrisome physical exam findings include severe pain in the ankle. " The initial radiographic and laboratory data are worrisome because of possible tenosynovitis. " The chronic co-morbidities include morbid obesity.   * I certify that at the point of admission it is my clinical judgment that the patient will require inpatient hospital care spanning beyond 2 midnights from the point of admission due to high intensity of service, high risk for further deterioration and high frequency of surveillance required.*     Inpatient, full code, no family present, transferred to MedSurg does not need telemetry   Kinda Pottle, MD  [facility] Direct contact: [phone] --Via amion app OR  --[email or web address]; password [email or web address]  7PM-7AM contact night coverage as above 10/15/2018, 8:12 AM  LOS: 0 days   Consultants:  None  Procedures:  MRI result pending  Antimicrobials:  No  Interval history/Subjective: Awake pleasant alert no distress states pain is not controlled EOMI NCAT Is eating and drinking No fever no chills   Objective:  Vitals:  Vitals:   10/15/18 0723 10/15/18 0800  BP: (!) 167/93   Pulse:    Resp: 17   Temp:  97.6 F (36.4 C)  SpO2:      Exam:  . EOMI NCAT no icterus no pallor . Throat is clear . Thick  neck . Multiple tattoos . Chest is clinically clear . S1-S2 no murmur . Left ankle is tender over the forefoot she does not allow either dorsi or plantarflexion passively or medial or lateral version of the ankle so I will wait on MRI report   I have  personally reviewed the following:   Labs:  Potassium 3.0 magnesium 1.5  WBC 11.1  Sed rate 29  MRI ankle  Imaging studies:  MRI ankle shows mild Tina synovitis of peroneal and posterior tibialis tendons which could be posttraumatic  Medical tests:  y   Test discussed with performing physician:  y  Decision to obtain old records:  y  Review and summation of old records:  y  Scheduled Meds: . amLODipine  5 mg Oral Daily  . enoxaparin (LOVENOX) injection  40 mg Subcutaneous QHS  . hydrochlorothiazide  25 mg Oral Daily  . ibuprofen  800 mg Oral Q6H  . lisinopril  10 mg Oral Daily  . magnesium oxide  400 mg Oral BID  . oxyCODONE  5 mg Oral Once   Continuous Infusions:  Principal Problem:   Hypertensive urgency Active Problems:   Right ankle pain   LOS: 0 days

## 2018-10-15 NOTE — Care Management Note (Signed)
Case Management Note  Patient Details  Name: Grace Young MRN: [id number] Date of Birth: [birth year]  Subjective/Objective:                  Hypertensive crisis  Action/Plan: Will follow for progression of care and clinical status. Will follow for case management needs none present at this time.  Expected Discharge Date:                  Expected Discharge Plan:  Home/Self Care  In-House Referral:     Discharge planning Services  CM Consult  Post Acute Care Choice:    Choice offered to:     DME Arranged:    DME Agency:     HH Arranged:    HH Agency:     Status of Service:  In process, will continue to follow  If discussed at Long Length of Stay Meetings, dates discussed:    Additional Comments:  Golda Acre, RN 10/15/2018, 10:11 AM

## 2018-10-16 DIAGNOSIS — I160 Hypertensive urgency: Principal | ICD-10-CM

## 2018-10-16 LAB — BASIC METABOLIC PANEL
Anion gap: 10 (ref 5–15)
BUN: 13 mg/dL (ref 6–20)
CO2: 28 mmol/L (ref 22–32)
Calcium: 9 mg/dL (ref 8.9–10.3)
Chloride: 98 mmol/L (ref 98–111)
Creatinine, Ser: 1.06 mg/dL — ABNORMAL HIGH (ref 0.44–1.00)
GFR calc non Af Amer: 59 mL/min — ABNORMAL LOW (ref >60)
Glucose, Bld: 114 mg/dL — ABNORMAL HIGH (ref 70–99)
POTASSIUM: 3.8 mmol/L (ref 3.5–5.1)
SODIUM: 136 mmol/L (ref 135–145)

## 2018-10-16 LAB — MAGNESIUM: Magnesium: 2.2 mg/dL (ref 1.7–2.4)

## 2018-10-16 LAB — URIC ACID: URIC ACID, SERUM: 8.9 mg/dL — AB (ref 2.5–7.1)

## 2018-10-16 MED ORDER — PREDNISONE 20 MG PO TABS
60.0000 mg | ORAL_TABLET | Freq: Every day | ORAL | Status: DC
  Administered 2018-10-17 – 2018-10-18 (×2): 60 mg via ORAL
  Filled 2018-10-16 (×2): qty 3

## 2018-10-16 MED ORDER — DIPHENHYDRAMINE HCL 50 MG PO CAPS
50.0000 mg | ORAL_CAPSULE | Freq: Once | ORAL | Status: AC
  Administered 2018-10-16: 50 mg via ORAL
  Filled 2018-10-16: qty 1

## 2018-10-16 MED ORDER — TRAMADOL HCL 50 MG PO TABS
100.0000 mg | ORAL_TABLET | Freq: Four times a day (QID) | ORAL | Status: DC
  Administered 2018-10-16 – 2018-10-18 (×9): 100 mg via ORAL
  Filled 2018-10-16 (×9): qty 2

## 2018-10-16 MED ORDER — OXYCODONE HCL 5 MG PO TABS: 5.0000 mg | ORAL_TABLET | Freq: Four times a day (QID) | ORAL | Status: DC | PRN

## 2018-10-16 MED ORDER — HYDROCHLOROTHIAZIDE 25 MG PO TABS
25.0000 mg | ORAL_TABLET | Freq: Every day | ORAL | Status: DC
  Administered 2018-10-16 – 2018-10-17 (×2): 25 mg via ORAL
  Filled 2018-10-16 (×2): qty 1

## 2018-10-16 NOTE — Progress Notes (Signed)
[facility] PROGRESS NOTE  Grace Young WUJ:[id number] DOB: 1966-03-29 DOA: 10/14/2018 PCP: System, Pcp Not In   Narrative: 52 year old African-American female Not taking blood pressure meds-states has been on multiple meds and they "just do not work" Found to be very hypertensive after falling and hurting her ankle Chronic migraines and headaches in addition to dizziness She has been seen in the emergency room for left ankle pain in the past--she was last seen for this 8/3 and ultrasound of leg was negative She was admitted for hypertensive urgency and acute right ankle pain after fall   A & Plan Acute right ankle pain-MRI shows tenosynovitis with no overt infecitous source-pain control ibuprofen high-dose 800 mg 3 times --added scheduled tramadol 100 q6, for severe Percocet Ambulate later today Have called Dr. Eulah Pont to consult with recommnedations  Leukocytosis no overt source-reassess labs a.m.  Hypertensive urgency blood pressure is better controlled in the 160 range-augment treatment with amlodipine 5 in addition to her other meds Continue meds  Hypokalemia potassium 3.0 magnesium 1.5 replacing both  Poor sleep at night-habitus of [facility]-get hs o2 sat  BMI >32-outpatient weight loss  Severity of Illness: The appropriate patient status for this patient is INPATIENT. Inpatient status is judged to be reasonable and necessary in order to provide the required intensity of service to ensure the patient's safety. The patient's presenting symptoms, physical exam findings, and initial radiographic and laboratory data in the context of their chronic comorbidities is felt to place them at high risk for further clinical deterioration. Furthermore, it is not anticipated that the patient will be medically stable for discharge from the hospital within 2 midnights of admission. The following factors support the patient status of inpatient.   " The patient's presenting symptoms include  electrolyte disturbances and acute ankle pain precluding walking. " The worrisome physical exam findings include severe pain in the ankle. " The initial radiographic and laboratory data are worrisome because of possible tenosynovitis. " The chronic co-morbidities include morbid obesity.   * I certify that at the point of admission it is my clinical judgment that the patient will require inpatient hospital care spanning beyond 2 midnights from the point of admission due to high intensity of service, high risk for further deterioration and high frequency of surveillance required.*     Inpatient, full code, no family present, transferred to MedSurg does not need telemetry   Sincere Liuzzi, MD  [facility] Direct contact: [phone] --Via amion app OR  --[email or web address]; password [email or web address]  7PM-7AM contact night coverage as above 10/16/2018, 11:47 AM  LOS: 1 day   Consultants:  None  Procedures:  MRI result pending  Antimicrobials:  No  Interval history/Subjective:  Still in pain-not really ambulating-states ibuprofen not working Tender in Lat malleoli of both feet No fever   Objective:  Vitals:  Vitals:   10/16/18 0900 10/16/18 1101  BP: (!) 162/86 (!) 150/90  Pulse: 74 90  Resp: 15 18  Temp:  99.1 F (37.3 C)  SpO2: 98% 96%    Exam:  . EOMI NCAT no icterus no pallor . Throat is clear . Thick neck . Multiple tattoos . Chest is clinically clear . S1-S2 no murmur . Left ankle is tender over the forefootbut now more-so in the lat malleolous--doesn't really allow for eversion of inversion   I have personally reviewed the following:   Labs:  Potassium 3.0 magnesium 1.5  WBC 11.1  Sed rate 29  MRI ankle  Imaging studies:  MRI ankle shows mild  Tina synovitis of peroneal and posterior tibialis tendons which could be posttraumatic  Medical tests:  y   Test discussed with performing physician:  y  Decision to obtain old records:  y  Review  and summation of old records:  y  Scheduled Meds: . amLODipine  5 mg Oral Daily  . enoxaparin (LOVENOX) injection  40 mg Subcutaneous QHS  . hydrochlorothiazide  25 mg Oral Daily  . hydrochlorothiazide  25 mg Oral Daily  . ibuprofen  800 mg Oral Q6H  . lisinopril  10 mg Oral Daily  . magnesium oxide  400 mg Oral BID  . traMADol  100 mg Oral Q6H   Continuous Infusions:  Principal Problem:   Hypertensive urgency Active Problems:   Right ankle pain   Hypertensive crisis   LOS: 1 day

## 2018-10-16 NOTE — Consult Note (Signed)
Reason for Consult:Right foot pain Referring Physician: Nada Libman  Grace Young is an 52 y.o. female.  HPI: Grace Young came to the hospital with complaint of right foot pain for a few days duration.  She says she got scared by her son and thinks she must have twisted it on Monday.  However the foot did not start hurting until later that night or the next day.  The pain steadily increased to the point where she was essentially unable to bear weight on it.  She denies any numbness or tingling or wounds.  She says the pain is mostly on the top of her foot and is severe in nature.  She denies any prior history of similar problems though when asked admits she was seen for a similar kind of foot pain in her left foot 2 months ago.  There was no precipitating event for that pain.  She was treated for a musculoskeletal etiology for her first ED visit.  She returned 2 days later, was not improved, and diagnosed with a cellulitis and treated.  She eventually got better from that.  She denies any history of gout, fevers, chills, sweats, nausea, or vomiting.  She denies any recent illness including urinary and vaginal symptoms.  Past Medical History:  Diagnosis Date  . Hypertension     Past Surgical History:  Procedure Laterality Date  . cyst removal from uterus and ovaries    . TONSILLECTOMY      Family History  Problem Relation Age of Onset  . Hypertension Sister   . Breast cancer Cousin   . Diabetes Mellitus II Neg Hx     Social History:  reports that she has never smoked. She has never used smokeless tobacco. She reports that she drinks about 1.0 standard drinks of alcohol per week. She reports that she does not use drugs.  Allergies: No Known Allergies  Medications: I have reviewed the patient's current medications.  Results for orders placed or performed during the hospital encounter of 10/14/18 (from the past 48 hour(s))  Urinalysis, Routine w reflex microscopic     Status: Abnormal   Collection Time: 10/14/18  5:50 PM  Result Value Ref Range   Color, Urine YELLOW YELLOW   APPearance CLEAR CLEAR   Specific Gravity, Urine 1.012 1.005 - 1.030   pH 8.0 5.0 - 8.0   Glucose, UA NEGATIVE NEGATIVE mg/dL   Hgb urine dipstick LARGE (A) NEGATIVE   Bilirubin Urine NEGATIVE NEGATIVE   Ketones, ur NEGATIVE NEGATIVE mg/dL   Protein, ur 100 (A) NEGATIVE mg/dL   Nitrite NEGATIVE NEGATIVE   Leukocytes, UA NEGATIVE NEGATIVE   RBC / HPF 0-5 0 - 5 RBC/hpf   WBC, UA 11-20 0 - 5 WBC/hpf   Bacteria, UA RARE (A) NONE SEEN   Squamous Epithelial / LPF 0-5 0 - 5    Comment: Performed at [facility], [place] [address], [place], [place] [address]  CBC with Differential     Status: Abnormal   Collection Time: 10/14/18  7:02 PM  Result Value Ref Range   WBC 14.4 (H) 4.0 - 10.5 K/uL   RBC 3.76 (L) 3.87 - 5.11 MIL/uL   Hemoglobin 12.1 12.0 - 15.0 g/dL   HCT 36.9 36.0 - 46.0 %   MCV 98.1 80.0 - 100.0 fL   MCH 32.2 26.0 - 34.0 pg   MCHC 32.8 30.0 - 36.0 g/dL   RDW 18.3 (H) 11.5 - 15.5 %   Platelets 321 150 - 400  K/uL   nRBC 0.0 0.0 - 0.2 %   Neutrophils Relative % 89 %   Neutro Abs 12.7 (H) 1.7 - 7.7 K/uL   Lymphocytes Relative 7 %   Lymphs Abs 1.0 0.7 - 4.0 K/uL   Monocytes Relative 4 %   Monocytes Absolute 0.6 0.1 - 1.0 K/uL   Eosinophils Relative 0 %   Eosinophils Absolute 0.0 0.0 - 0.5 K/uL   Basophils Relative 0 %   Basophils Absolute 0.0 0.0 - 0.1 K/uL   Immature Granulocytes 0 %   Abs Immature Granulocytes 0.06 0.00 - 0.07 K/uL    Comment: Performed at [facility], [place] [address], [place], [place] [address]  Basic metabolic panel     Status: Abnormal   Collection Time: 10/14/18  7:02 PM  Result Value Ref Range   Sodium 138 135 - 145 mmol/L   Potassium 3.3 (L) 3.5 - 5.1 mmol/L   Chloride 102 98 - 111 mmol/L   CO2 25 22 - 32 mmol/L   Glucose, Bld 106 (H) 70 - 99 mg/dL   BUN 10 6 - 20 mg/dL   Creatinine, Ser 0.85 0.44 - 1.00 mg/dL    Calcium 9.6 8.9 - 10.3 mg/dL   GFR calc non Af Amer >60 >60 mL/min   GFR calc Af Amer >60 >60 mL/min    Comment: (NOTE) The eGFR has been calculated using the CKD EPI equation. This calculation has not been validated in all clinical situations. eGFR's persistently <60 mL/min signify possible Chronic Kidney Disease.    Anion gap 11 5 - 15    Comment: Performed at [facility], [place] [address] [place], [place] [address]  Hepatic function panel     Status: Abnormal   Collection Time: 10/14/18  7:02 PM  Result Value Ref Range   Total Protein 8.5 (H) 6.5 - 8.1 g/dL   Albumin 4.4 3.5 - 5.0 g/dL   AST 30 15 - 41 U/L   ALT 23 0 - 44 U/L   Alkaline Phosphatase 73 38 - 126 U/L   Total Bilirubin 0.6 0.3 - 1.2 mg/dL   Bilirubin, Direct 0.2 0.0 - 0.2 mg/dL   Indirect Bilirubin 0.4 0.3 - 0.9 mg/dL    Comment: Performed at [facility], [place] [address], [place], [place] [address]  Brain natriuretic peptide     Status: Abnormal   Collection Time: 10/14/18  7:02 PM  Result Value Ref Range   B Natriuretic Peptide 101.9 (H) 0.0 - 100.0 pg/mL    Comment: Performed at [facility], [place] [address], [place], [place] [address]  I-Stat Troponin, ED (not at [facility])     Status: None   Collection Time: 10/14/18  7:11 PM  Result Value Ref Range   Troponin i, poc 0.00 0.00 - 0.08 ng/mL   Comment 3            Comment: Due to the release kinetics of cTnI, a negative result within the first hours of the onset of symptoms does not rule out myocardial infarction with certainty. If myocardial infarction is still suspected, repeat the test at appropriate intervals.   I-Stat Beta hCG blood, ED ([facility], [facility], [facility] only)     Status: None   Collection Time: 10/14/18  7:11 PM  Result Value Ref Range   I-stat hCG, quantitative <5.0 <5 mIU/mL   Comment 3            Comment:   GEST. AGE  CONC.  (mIU/mL)   <=1 WEEK        5 - 50     2 WEEKS       50 - 500     3  WEEKS       100 - 10,000     4 WEEKS     1,000 - 30,000        FEMALE AND NON-PREGNANT FEMALE:     LESS THAN 5 mIU/mL   Urine rapid drug screen (hosp performed)     Status: None   Collection Time: 10/14/18  7:37 PM  Result Value Ref Range   Opiates NONE DETECTED NONE DETECTED   Cocaine NONE DETECTED NONE DETECTED   Benzodiazepines NONE DETECTED NONE DETECTED   Amphetamines NONE DETECTED NONE DETECTED   Tetrahydrocannabinol NONE DETECTED NONE DETECTED   Barbiturates NONE DETECTED NONE DETECTED    Comment: (NOTE) DRUG SCREEN FOR MEDICAL PURPOSES ONLY.  IF CONFIRMATION IS NEEDED FOR ANY PURPOSE, NOTIFY LAB WITHIN 5 DAYS. LOWEST DETECTABLE LIMITS FOR URINE DRUG SCREEN Drug Class                     Cutoff (ng/mL) Amphetamine and metabolites    1000 Barbiturate and metabolites    200 Benzodiazepine                 161 Tricyclics and metabolites     300 Opiates and metabolites        300 Cocaine and metabolites        300 THC                            50 Performed at [facility], [place] [address], [place], [place] [address]   TSH     Status: None   Collection Time: 10/14/18  8:55 PM  Result Value Ref Range   TSH 1.163 0.350 - 4.500 uIU/mL    Comment: Performed by a 3rd Generation assay with a functional sensitivity of <=0.01 uIU/mL. Performed at [facility], [place] [address] [place], [place] [address]   HIV antibody (Routine Testing)     Status: None   Collection Time: 10/14/18 11:46 PM  Result Value Ref Range   HIV Screen 4th Generation wRfx Non Reactive Non Reactive    Comment: (NOTE) Performed At: [facility] [place], [place] [id number] Rush Farmer MD FA:[id number]   Troponin I     Status: None   Collection Time: 10/14/18 11:46 PM  Result Value Ref Range   Troponin I <0.03 <0.03 ng/mL    Comment: Performed at [facility], [place] [address] [place], [place] [address]  Magnesium      Status: Abnormal   Collection Time: 10/14/18 11:46 PM  Result Value Ref Range   Magnesium 1.5 (L) 1.7 - 2.4 mg/dL    Comment: Performed at [facility], [place] [address] [place], [place] [address]  MRSA PCR Screening     Status: None   Collection Time: 10/15/18  3:35 AM  Result Value Ref Range   MRSA by PCR NEGATIVE NEGATIVE    Comment:        The GeneXpert MRSA Assay (FDA approved for NASAL specimens only), is one component of a comprehensive MRSA colonization surveillance program. It is not intended to diagnose MRSA infection nor to guide or monitor treatment for MRSA infections. Performed at [facility],  [place] [address], [place], [place] [address]   Basic metabolic panel     Status: Abnormal   Collection Time: 10/15/18  6:01 AM  Result Value Ref Range   Sodium 135 135 - 145 mmol/L   Potassium 3.0 (L) 3.5 - 5.1 mmol/L   Chloride 98 98 - 111 mmol/L   CO2 26 22 - 32 mmol/L   Glucose, Bld 123 (H) 70 - 99 mg/dL   BUN 11 6 - 20 mg/dL   Creatinine, Ser 0.83 0.44 - 1.00 mg/dL   Calcium 8.9 8.9 - 10.3 mg/dL   GFR calc non Af Amer >60 >60 mL/min   GFR calc Af Amer >60 >60 mL/min    Comment: (NOTE) The eGFR has been calculated using the CKD EPI equation. This calculation has not been validated in all clinical situations. eGFR's persistently <60 mL/min signify possible Chronic Kidney Disease.    Anion gap 11 5 - 15    Comment: Performed at [facility], [place] [address] [place], [place] [address]  CBC     Status: Abnormal   Collection Time: 10/15/18  6:01 AM  Result Value Ref Range   WBC 11.1 (H) 4.0 - 10.5 K/uL   RBC 3.33 (L) 3.87 - 5.11 MIL/uL   Hemoglobin 10.8 (L) 12.0 - 15.0 g/dL   HCT 33.2 (L) 36.0 - 46.0 %   MCV 99.7 80.0 - 100.0 fL   MCH 32.4 26.0 - 34.0 pg   MCHC 32.5 30.0 - 36.0 g/dL   RDW 18.5 (H) 11.5 - 15.5 %   Platelets 257 150 - 400 K/uL   nRBC 0.0 0.0 - 0.2 %    Comment: Performed at [facility], [place] [address], [place], [place] [address]  Troponin I     Status: None   Collection Time: 10/15/18  6:01 AM  Result Value Ref Range   Troponin I <0.03 <0.03 ng/mL    Comment: Performed at [facility], [place] [address] [place], [place] [address]  Troponin I     Status: None   Collection Time: 10/15/18  6:01 AM  Result Value Ref Range   Troponin I <0.03 <0.03 ng/mL    Comment: Performed at [facility], [place] [address] [place], [place] [address]  Sedimentation rate     Status: Abnormal   Collection Time: 10/15/18  6:01 AM  Result Value Ref Range   Sed Rate 29 (H) 0 - 22 mm/hr    Comment: Performed at [facility], [place] [address], [place], [place] [address]  Magnesium     Status: None   Collection Time: 10/16/18  3:25 AM  Result Value Ref Range   Magnesium 2.2 1.7 - 2.4 mg/dL    Comment: Performed at [facility], [place] [address], [place], [place] [address]  Basic metabolic panel     Status: Abnormal   Collection Time: 10/16/18  3:25 AM  Result Value Ref Range   Sodium 136 135 - 145 mmol/L   Potassium 3.8 3.5 - 5.1 mmol/L    Comment: DELTA CHECK NOTED REPEATED TO VERIFY NO VISIBLE HEMOLYSIS    Chloride 98 98 - 111 mmol/L   CO2 28 22 - 32 mmol/L   Glucose, Bld 114 (H) 70 - 99 mg/dL   BUN 13 6 - 20 mg/dL   Creatinine, Ser 1.06 (H) 0.44 - 1.00 mg/dL   Calcium 9.0 8.9 - 10.3 mg/dL   GFR calc non Af  Amer 59 (L) >60 mL/min   GFR calc Af Amer >60 >60 mL/min    Comment: (NOTE) The eGFR has been calculated using the CKD EPI equation. This calculation has not been validated in all clinical situations. eGFR's persistently <60 mL/min signify possible Chronic Kidney Disease.    Anion gap 10 5 - 15    Comment: Performed at [facility], [place] [address], [place], [place] [address]    Dg Chest 2 View  Result Date: 10/14/2018 CLINICAL DATA:  52 y/o F; subjective fever and  shortness of breath for 1 day. EXAM: CHEST - 2 VIEW COMPARISON:  07/26/2018 CT chest. FINDINGS: The heart size and mediastinal contours are within normal limits given projection and technique. Left upper lobe platelike atelectasis. No consolidation, effusion, or pneumothorax. The visualized skeletal structures are unremarkable. IMPRESSION: No acute pulmonary process identified. Electronically Signed   By: Kristine Garbe M.D.   On: 10/14/2018 19:23   Dg Ankle Complete Right  Result Date: 10/14/2018 CLINICAL DATA:  Twisting injury with lateral pain EXAM: RIGHT ANKLE - COMPLETE 3+ VIEW COMPARISON:  None. FINDINGS: There is lateral soft tissue swelling. No visible fracture or dislocation. No joint effusion. IMPRESSION: Lateral soft tissue swelling.  No fracture. Electronically Signed   By: Nelson Chimes M.D.   On: 10/14/2018 15:54   Ct Head Wo Contrast  Result Date: 10/14/2018 CLINICAL DATA:  Elevated blood pressure EXAM: CT HEAD WITHOUT CONTRAST TECHNIQUE: Contiguous axial images were obtained from the base of the skull through the vertex without intravenous contrast. COMPARISON:  None. FINDINGS: Brain: No acute territorial infarction, hemorrhage, or intracranial mass is visualized. Mild atrophy. Mild to moderate small vessel ischemic changes of the white matter. Nonenlarged ventricles. Vascular: No hyperdense vessels.  No unexpected calcification Skull: Normal. Negative for fracture or focal lesion. Sinuses/Orbits: No acute finding. Other: None IMPRESSION: 1. No CT evidence for acute intracranial abnormality. 2. Small vessel ischemic changes of the white matter. Electronically Signed   By: Donavan Foil M.D.   On: 10/14/2018 21:40   Mr Ankle Right Wo Contrast  Result Date: 10/15/2018 CLINICAL DATA:  Severe ankle pain after a twisting injury. EXAM: MRI OF THE RIGHT ANKLE WITHOUT CONTRAST TECHNIQUE: Multiplanar, multisequence MR imaging of the ankle was performed. No intravenous contrast was  administered. COMPARISON:  Radiographs dated 10/14/2018 FINDINGS: TENDONS Peroneal: There is a small amount of fluid in the sheaths of the peroneal tendons consistent with mild tenosynovitis. Posteromedial: Posterior tibialis tendon sheath consistent with tenosynovitis. Anterior: Normal. Achilles: Normal. Plantar Fascia: Normal. LIGAMENTS Lateral: Intact. Medial: Intact. CARTILAGE Ankle Joint: Minimal joint effusion.  No chondral defect. Subtalar Joints/Sinus Tarsi: Minimal joint effusion. No chondral defect. Bones: No fractures or bone contusions. Slight dorsal spurring at the talonavicular joint. Slight arthritic changes between the cuboid in the bases of the fourth and fifth metatarsals. Other: There is an intramuscular ganglion cyst in the extensor digitorum brevis muscle adjacent to the cuboid. There is slight edema in the subcutaneous fat at the medial and lateral aspects the ankle. IMPRESSION: 1. No fractures or ligament sprains or tears. 2. Nonspecific soft tissue edema at the medial and lateral aspects the ankle. 3. Minimal nonspecific ankle and subtalar joint effusions. 4. Benign intramuscular ganglion cyst in the extensor digitorum brevis muscle, not felt to be significant. 5. Mild tenosynovitis of the peroneal and posterior tibialis tendons. This could be posttraumatic. Electronically Signed   By: Lorriane Shire M.D.   On: 10/15/2018 08:00   Dg Foot Complete  Left  Result Date: 10/14/2018 CLINICAL DATA:  Twisting injury.  Lateral pain. EXAM: LEFT FOOT - COMPLETE 3+ VIEW COMPARISON:  Ankle same day. FINDINGS: There is no evidence of fracture or dislocation. There is no evidence of arthropathy or other focal bone abnormality. Soft tissues are unremarkable. IMPRESSION: Negative. Electronically Signed   By: Nelson Chimes M.D.   On: 10/14/2018 15:55   Dg Foot Complete Right  Result Date: 10/14/2018 CLINICAL DATA:  Twisting injury.  Right foot pain. EXAM: RIGHT FOOT COMPLETE - 3+ VIEW COMPARISON:   10/14/2018 FINDINGS: No acute bony abnormality. Specifically, no fracture, subluxation, or dislocation. Joint spaces maintained. Soft tissues intact. IMPRESSION: No acute bony abnormality. Electronically Signed   By: Rolm Baptise M.D.   On: 10/14/2018 19:25    Review of Systems  Constitutional: Positive for fever (Once, low-grade, on admission). Negative for chills and weight loss.  HENT: Negative for ear discharge, ear pain, hearing loss and tinnitus.   Eyes: Negative for blurred vision, double vision, photophobia and pain.  Respiratory: Negative for cough, sputum production and shortness of breath.   Cardiovascular: Negative for chest pain.  Gastrointestinal: Negative for abdominal pain, nausea and vomiting.  Genitourinary: Negative for dysuria, flank pain, frequency and urgency.  Musculoskeletal: Positive for joint pain (Right foot). Negative for back pain, falls, myalgias and neck pain.  Neurological: Negative for dizziness, tingling, sensory change, focal weakness, loss of consciousness and headaches.  Endo/Heme/Allergies: Does not bruise/bleed easily.  Psychiatric/Behavioral: Negative for depression, memory loss and substance abuse. The patient is not nervous/anxious.    Blood pressure (!) 150/90, pulse 90, temperature 99.1 F (37.3 C), temperature source Oral, resp. rate 18, height 5' 8"  (1.727 m), weight 99.8 kg, last menstrual period 10/14/2018, SpO2 96 %. Physical Exam  Constitutional: She appears well-developed and well-nourished. No distress.  HENT:  Head: Normocephalic and atraumatic.  Eyes: Conjunctivae are normal. Right eye exhibits no discharge. Left eye exhibits no discharge. No scleral icterus.  Neck: Normal range of motion.  Cardiovascular: Normal rate and regular rhythm.  Respiratory: Effort normal. No respiratory distress.  Musculoskeletal:  RLE No traumatic wounds, ecchymosis, or rash  Forefoot severe TTP dorsally, mild erythema noted lateral dorsal forefoot. No  ankle pain with AROM or with palpation except anteriorly  No knee effusion, mild ankle swelling laterally  Knee stable to varus/ valgus and anterior/posterior stress  Sens DPN, SPN, TN intact  Motor EHL, ext, evers 4/5, flex 2-3/5  DP 1+, PT 1+, No significant edema  Neurological: She is alert.  Skin: Skin is warm and dry. She is not diaphoretic.  Psychiatric: She has a normal mood and affect. Her behavior is normal.    Assessment/Plan: Right foot pain -- I suspect this is probably an atypical presentation of gout, especially given the similar symptoms 2 months ago. Reactive arthritis is also a possibility. Less likely would be ant tib tendonitis (as no finding on MRI), septic arthritis/cellulitis, or fasciitis. The tendonopathies noted on MRI are likely not clinically significant given her lack of symptoms relating to those. Given lack of a large effusion noted on MRI have asked IR to aspirate ankle joint for fluid analyis for cell count and GS/C&S. Have also checked uric acid level. I have written for Prednisone to start tomorrow. If GS is positive or if primary service thinks this is unwise given her HTN it can be discontinued. We will f/u on results. She can be WBAT. I don't think the CAM is necessary.    Legrand Como  Bard Herbert, PA-C Orthopedic Surgery [phone] 10/16/2018, 2:15 PM

## 2018-10-17 MED ORDER — SENNOSIDES-DOCUSATE SODIUM 8.6-50 MG PO TABS
1.0000 | ORAL_TABLET | Freq: Two times a day (BID) | ORAL | Status: DC
  Administered 2018-10-17: 1 via ORAL
  Filled 2018-10-17: qty 1

## 2018-10-17 MED ORDER — POLYETHYLENE GLYCOL 3350 17 G PO PACK
17.0000 g | PACK | Freq: Every day | ORAL | Status: DC
  Administered 2018-10-17: 17 g via ORAL
  Filled 2018-10-17: qty 1

## 2018-10-17 NOTE — Progress Notes (Signed)
[facility] PROGRESS NOTE  Grace Young WUJ:[id number] DOB: [birth year] DOA: 10/14/2018 PCP: System, Pcp Not In  Narrative:  52 year old African-American female Not taking blood pressure meds-states has been on multiple meds and they "just do not work" Found to be very hypertensive after falling and hurting her ankle Chronic migraines and headaches in addition to dizziness She has been seen in the emergency room for left ankle pain in the past--she was last seen for this 8/3 and ultrasound of leg was negative She was admitted for hypertensive urgency and acute right ankle pain after fall   A & Plan Acute right ankle pain-MRI shows tenosynovitis with no overt infecitous source-pain control ibuprofen high-dose 800 mg 3 times not helping Tramadol scheduled opiates Ambulated in room a litle today but "dizzy" Doesn't really want the prednisone-Uric acid was  Up a little--reasonable to continue 3-5 day course and see effect of it Not ready for d/c  Leukocytosis no overt source-reassess labs a.m.  Hypertensive urgency blood pressure is better controlled in the 160 range-augment treatment with amlodipine 5 in addition to her other meds Continue meds Get orthostatic VS--may need to scale back meds a little to see how the dizzyness improves  Hypokalemia potassium 3.8 now  Recheck am mag  Poor sleep at night-habitus of [facility]-get hs o2 sat  BMI >32-outpatient weight loss  Severity of Illness: The appropriate patient status for this patient is INPATIENT. Inpatient status is judged to be reasonable and necessary in order to provide the required intensity of service to ensure the patient's safety. The patient's presenting symptoms, physical exam findings, and initial radiographic and laboratory data in the context of their chronic comorbidities is felt to place them at high risk for further clinical deterioration. Furthermore, it is not anticipated that the patient will be medically stable for  discharge from the hospital within 2 midnights of admission. The following factors support the patient status of inpatient.   " The patient's presenting symptoms include electrolyte disturbances and acute ankle pain precluding walking. " The worrisome physical exam findings include severe pain in the ankle. " The initial radiographic and laboratory data are worrisome because of possible tenosynovitis. " The chronic co-morbidities include morbid obesity.   * I certify that at the point of admission it is my clinical judgment that the patient will require inpatient hospital care spanning beyond 2 midnights from the point of admission due to high intensity of service, high risk for further deterioration and high frequency of surveillance required.*     Inpatient, full code, no family present, transferred to MedSurg does not need telemetry   Anishka Bushard, MD  [facility] Direct contact: [phone] --Via amion app OR  --[email or web address]; password [email or web address]  7PM-7AM contact night coverage as above 10/17/2018, 2:10 PM  LOS: 2 days   Consultants:  None  Procedures:  MRI result as per chart  Antimicrobials:  No  Interval history/Subjective:  walked to RR Dizzy No cp ainly HA bitempraoal-smeels do not affect  Objective:  Vitals:  Vitals:   10/17/18 1155 10/17/18 1359  BP: (!) 185/99 (!) 157/89  Pulse: 100 83  Resp:  18  Temp:  98.9 F (37.2 C)  SpO2: 92% 96%    Exam:  . EOMI NCAT no icterus  . Throat is clear . Thick neck . Multiple tattoos . Chest is clinically clear . S1-S2 no murmur Left ankle iless tender and swollen  I have personally reviewed the following:   Labs:  Potassium 3.8  Uric acid 8.9  Imaging studies:  MRI ankle shows mild Tina synovitis of peroneal and posterior tibialis tendons which could be posttraumatic  Medical tests:  y   Test discussed with performing physician:  y  Decision to obtain old records:  y  Review and  summation of old records:  y  Scheduled Meds: . amLODipine  5 mg Oral Daily  . enoxaparin (LOVENOX) injection  40 mg Subcutaneous QHS  . hydrochlorothiazide  25 mg Oral Daily  . ibuprofen  800 mg Oral Q6H  . lisinopril  10 mg Oral Daily  . magnesium oxide  400 mg Oral BID  . polyethylene glycol  17 g Oral Daily  . predniSONE  60 mg Oral Q breakfast  . senna-docusate  1 tablet Oral BID  . traMADol  100 mg Oral Q6H   Continuous Infusions:  Principal Problem:   Hypertensive urgency Active Problems:   Right ankle pain   Hypertensive crisis   LOS: 2 days

## 2018-10-17 NOTE — Progress Notes (Signed)
Orthostatic  Vital signs : lying 157/89, HR 109 . Sitting 179/89, HR 120, Standing 178/99, HR 112

## 2018-10-18 LAB — BASIC METABOLIC PANEL
Anion gap: 10 (ref 5–15)
BUN: 17 mg/dL (ref 6–20)
CALCIUM: 9.8 mg/dL (ref 8.9–10.3)
CO2: 28 mmol/L (ref 22–32)
CREATININE: 0.96 mg/dL (ref 0.44–1.00)
Chloride: 96 mmol/L — ABNORMAL LOW (ref 98–111)
Glucose, Bld: 110 mg/dL — ABNORMAL HIGH (ref 70–99)
Potassium: 4.2 mmol/L (ref 3.5–5.1)
Sodium: 134 mmol/L — ABNORMAL LOW (ref 135–145)

## 2018-10-18 LAB — MAGNESIUM: MAGNESIUM: 2.3 mg/dL (ref 1.7–2.4)

## 2018-10-18 MED ORDER — LISINOPRIL 10 MG PO TABS: 10.0000 mg | ORAL_TABLET | Freq: Every day | ORAL | 1 refills | Status: DC

## 2018-10-18 MED ORDER — TRAMADOL HCL 50 MG PO TABS: 100.0000 mg | ORAL_TABLET | Freq: Four times a day (QID) | ORAL | 0 refills | Status: DC

## 2018-10-18 MED ORDER — HYDROCHLOROTHIAZIDE 25 MG PO TABS: 25.0000 mg | ORAL_TABLET | Freq: Every day | ORAL | 1 refills | Status: DC

## 2018-10-18 MED ORDER — PREDNISONE 20 MG PO TABS: 60.0000 mg | ORAL_TABLET | Freq: Every day | ORAL | 0 refills | Status: DC

## 2018-10-18 MED ORDER — AMLODIPINE BESYLATE 5 MG PO TABS: 5.0000 mg | ORAL_TABLET | Freq: Every day | ORAL | 1 refills | Status: DC

## 2018-10-18 NOTE — Discharge Summary (Signed)
Physician Discharge Summary  Grace Young WUJ:[id number] DOB: 1966-07-18 DOA: 10/14/2018  PCP: System, Pcp Not In  Admit date: 10/14/2018 Discharge date: 10/18/2018  Time spent: 35 minutes  Recommendations for Outpatient Follow-up:  1. Needs chem 7 and refill in meds on follow up  Discharge Diagnoses:  Principal Problem:   Hypertensive urgency Active Problems:   Right ankle pain   Hypertensive crisis   Discharge Condition: improved  Diet recommendation: hh low salt   Filed Weights   10/14/18 1517  Weight: 99.8 kg    History of present illness:  52 year old African-American female Not taking blood pressure meds-states has been on multiple meds and they "just do not work" Found to be very hypertensive after falling and hurting her ankle Chronic migraines and headaches in addition to dizziness She has been seen in the emergency room for left ankle pain in the past--she was last seen for this 8/3 and ultrasound of leg was negative She was admitted for hypertensive urgency and acute right ankle pain after fall  Hospital Course:  Acute right ankle pain-MRI shows tenosynovitis with no overt infecitous source-pain relieved by Tramadol-rx given Ambulated in room a litle today but "dizzy" Given 2 more days of prednisone as patient not willing to use  Leukocytosis no overt source-reassess labs a.m.  Hypertensive urgency blood pressure is better controlled in the 160 range-augment treatment with amlodipine 5 --see MAr for info--Nee slabs 1 week and close f/u  Hypokalemia potassium 3.8 now  Recheck am mag  Poor sleep at night-habitus of [facility]-get hs o2 sat  BMI >32-outpatient weight loss   Consultations:  ortho  Discharge Exam: Vitals:   10/17/18 2125 10/18/18 0448  BP: (!) 195/113 (!) 176/96  Pulse: 76 65  Resp:  12  Temp:  98.4 F (36.9 C)  SpO2:  97%    General: awake alert in nad Cardiovascular: s1 s2 no m/r/g Respiratory: clear no added sound abd  soft nt nd  Discharge Instructions   Discharge Instructions    Diet - low sodium heart healthy   Complete by:  As directed    Discharge instructions   Complete by:  As directed    Take all blood pressure meds as indicated Finish steroids as well Take tramadol if needed for short term   Increase activity slowly   Complete by:  As directed      Allergies as of 10/18/2018   No Known Allergies     Medication List    STOP taking these medications   cephALEXin 500 MG capsule Commonly known as:  KEFLEX   HYDROcodone-acetaminophen 5-325 MG tablet Commonly known as:  NORCO/VICODIN   potassium chloride SA 20 MEQ tablet Commonly known as:  K-DUR,KLOR-CON     TAKE these medications   amLODipine 5 MG tablet Commonly known as:  NORVASC Take 1 tablet (5 mg total) by mouth daily.   hydrochlorothiazide 25 MG tablet Commonly known as:  HYDRODIURIL Take 1 tablet (25 mg total) by mouth daily.   lisinopril 10 MG tablet Commonly known as:  PRINIVIL,ZESTRIL Take 1 tablet (10 mg total) by mouth daily.   predniSONE 20 MG tablet Commonly known as:  DELTASONE Take 3 tablets (60 mg total) by mouth daily with breakfast.   traMADol 50 MG tablet Commonly known as:  ULTRAM Take 2 tablets (100 mg total) by mouth every 6 (six) hours.      No Known Allergies    The results of significant diagnostics from this hospitalization (including imaging, microbiology, ancillary  and laboratory) are listed below for reference.    Significant Diagnostic Studies: Dg Chest 2 View  Result Date: 10/14/2018 CLINICAL DATA:  52 y/o F; subjective fever and shortness of breath for 1 day. EXAM: CHEST - 2 VIEW COMPARISON:  07/26/2018 CT chest. FINDINGS: The heart size and mediastinal contours are within normal limits given projection and technique. Left upper lobe platelike atelectasis. No consolidation, effusion, or pneumothorax. The visualized skeletal structures are unremarkable. IMPRESSION: No acute  pulmonary process identified. Electronically Signed   By: Mitzi Hansen M.D.   On: 10/14/2018 19:23   Dg Ankle Complete Right  Result Date: 10/14/2018 CLINICAL DATA:  Twisting injury with lateral pain EXAM: RIGHT ANKLE - COMPLETE 3+ VIEW COMPARISON:  None. FINDINGS: There is lateral soft tissue swelling. No visible fracture or dislocation. No joint effusion. IMPRESSION: Lateral soft tissue swelling.  No fracture. Electronically Signed   By: Paulina Fusi M.D.   On: 10/14/2018 15:54   Ct Head Wo Contrast  Result Date: 10/14/2018 CLINICAL DATA:  Elevated blood pressure EXAM: CT HEAD WITHOUT CONTRAST TECHNIQUE: Contiguous axial images were obtained from the base of the skull through the vertex without intravenous contrast. COMPARISON:  None. FINDINGS: Brain: No acute territorial infarction, hemorrhage, or intracranial mass is visualized. Mild atrophy. Mild to moderate small vessel ischemic changes of the white matter. Nonenlarged ventricles. Vascular: No hyperdense vessels.  No unexpected calcification Skull: Normal. Negative for fracture or focal lesion. Sinuses/Orbits: No acute finding. Other: None IMPRESSION: 1. No CT evidence for acute intracranial abnormality. 2. Small vessel ischemic changes of the white matter. Electronically Signed   By: Jasmine Pang M.D.   On: 10/14/2018 21:40   Mr Ankle Right Wo Contrast  Result Date: 10/15/2018 CLINICAL DATA:  Severe ankle pain after a twisting injury. EXAM: MRI OF THE RIGHT ANKLE WITHOUT CONTRAST TECHNIQUE: Multiplanar, multisequence MR imaging of the ankle was performed. No intravenous contrast was administered. COMPARISON:  Radiographs dated 10/14/2018 FINDINGS: TENDONS Peroneal: There is a small amount of fluid in the sheaths of the peroneal tendons consistent with mild tenosynovitis. Posteromedial: Posterior tibialis tendon sheath consistent with tenosynovitis. Anterior: Normal. Achilles: Normal. Plantar Fascia: Normal. LIGAMENTS Lateral:  Intact. Medial: Intact. CARTILAGE Ankle Joint: Minimal joint effusion.  No chondral defect. Subtalar Joints/Sinus Tarsi: Minimal joint effusion. No chondral defect. Bones: No fractures or bone contusions. Slight dorsal spurring at the talonavicular joint. Slight arthritic changes between the cuboid in the bases of the fourth and fifth metatarsals. Other: There is an intramuscular ganglion cyst in the extensor digitorum brevis muscle adjacent to the cuboid. There is slight edema in the subcutaneous fat at the medial and lateral aspects the ankle. IMPRESSION: 1. No fractures or ligament sprains or tears. 2. Nonspecific soft tissue edema at the medial and lateral aspects the ankle. 3. Minimal nonspecific ankle and subtalar joint effusions. 4. Benign intramuscular ganglion cyst in the extensor digitorum brevis muscle, not felt to be significant. 5. Mild tenosynovitis of the peroneal and posterior tibialis tendons. This could be posttraumatic. Electronically Signed   By: Francene Boyers M.D.   On: 10/15/2018 08:00   Dg Foot Complete Left  Result Date: 10/14/2018 CLINICAL DATA:  Twisting injury.  Lateral pain. EXAM: LEFT FOOT - COMPLETE 3+ VIEW COMPARISON:  Ankle same day. FINDINGS: There is no evidence of fracture or dislocation. There is no evidence of arthropathy or other focal bone abnormality. Soft tissues are unremarkable. IMPRESSION: Negative. Electronically Signed   By: Paulina Fusi M.D.   On: 10/14/2018  15:55   Dg Foot Complete Right  Result Date: 10/14/2018 CLINICAL DATA:  Twisting injury.  Right foot pain. EXAM: RIGHT FOOT COMPLETE - 3+ VIEW COMPARISON:  10/14/2018 FINDINGS: No acute bony abnormality. Specifically, no fracture, subluxation, or dislocation. Joint spaces maintained. Soft tissues intact. IMPRESSION: No acute bony abnormality. Electronically Signed   By: Charlett Nose M.D.   On: 10/14/2018 19:25    Microbiology: Recent Results (from the past 240 hour(s))  MRSA PCR Screening     Status:  None   Collection Time: 10/15/18  3:35 AM  Result Value Ref Range Status   MRSA by PCR NEGATIVE NEGATIVE Final    Comment:        The GeneXpert MRSA Assay (FDA approved for NASAL specimens only), is one component of a comprehensive MRSA colonization surveillance program. It is not intended to diagnose MRSA infection nor to guide or monitor treatment for MRSA infections. Performed at [facility], [address] [place], [place] [address]      Labs: Basic Metabolic Panel: Recent Labs  Lab 10/14/18 1902 10/14/18 2346 10/15/18 0601 10/16/18 0325 10/18/18 0307  NA 138  --  135 136 134*  K 3.3*  --  3.0* 3.8 4.2  CL 102  --  98 98 96*  CO2 25  --  26 28 28   GLUCOSE 106*  --  123* 114* 110*  BUN 10  --  11 13 17   CREATININE 0.85  --  0.83 1.06* 0.96  CALCIUM 9.6  --  8.9 9.0 9.8  MG  --  1.5*  --  2.2 2.3   Liver Function Tests: Recent Labs  Lab 10/14/18 1902  AST 30  ALT 23  ALKPHOS 73  BILITOT 0.6  PROT 8.5*  ALBUMIN 4.4   No results for input(s): LIPASE, AMYLASE in the last 168 hours. No results for input(s): AMMONIA in the last 168 hours. CBC: Recent Labs  Lab 10/14/18 1902 10/15/18 0601  WBC 14.4* 11.1*  NEUTROABS 12.7*  --   HGB 12.1 10.8*  HCT 36.9 33.2*  MCV 98.1 99.7  PLT 321 257   Cardiac Enzymes: Recent Labs  Lab 10/14/18 2346 10/15/18 0601  TROPONINI <0.03 <0.03  <0.03   BNP: BNP (last 3 results) Recent Labs    10/14/18 1902  BNP 101.9*    ProBNP (last 3 results) No results for input(s): PROBNP in the last 8760 hours.  CBG: No results for input(s): GLUCAP in the last 168 hours.     Signed:  Rhetta Mura MD   [facility] 10/18/2018, 11:22 AM

## 2018-10-18 NOTE — Progress Notes (Signed)
Discharge instructions reviewed with patient. Patient verbalized understanding and will be discharged via private vehicle when ride arrives.

## 2018-10-18 NOTE — Progress Notes (Signed)
Received referral to assist pt with a RW. Contacted Reggie at [facility] for DME referral.

## 2019-05-08 ENCOUNTER — Emergency Department (HOSPITAL_COMMUNITY): Payer: Self-pay

## 2019-05-08 ENCOUNTER — Encounter (HOSPITAL_COMMUNITY): Payer: Self-pay | Admitting: Emergency Medicine

## 2019-05-08 ENCOUNTER — Other Ambulatory Visit: Payer: Self-pay

## 2019-05-08 ENCOUNTER — Inpatient Hospital Stay (HOSPITAL_COMMUNITY)
Admission: EM | Admit: 2019-05-08 | Discharge: 2019-05-10 | DRG: 305 | Disposition: A | Discharge status: Home / Self Care | Payer: Self-pay | Attending: Family Medicine | Admitting: Family Medicine

## 2019-05-08 DIAGNOSIS — I10 Essential (primary) hypertension: Secondary | ICD-10-CM | POA: Diagnosis present

## 2019-05-08 DIAGNOSIS — E269 Hyperaldosteronism, unspecified: Secondary | ICD-10-CM | POA: Diagnosis present

## 2019-05-08 DIAGNOSIS — I160 Hypertensive urgency: Secondary | ICD-10-CM | POA: Diagnosis present

## 2019-05-08 DIAGNOSIS — I161 Hypertensive emergency: Principal | ICD-10-CM | POA: Diagnosis present

## 2019-05-08 DIAGNOSIS — Z7952 Long term (current) use of systemic steroids: Secondary | ICD-10-CM

## 2019-05-08 DIAGNOSIS — G510 Bell's palsy: Secondary | ICD-10-CM

## 2019-05-08 DIAGNOSIS — Z1159 Encounter for screening for other viral diseases: Secondary | ICD-10-CM

## 2019-05-08 DIAGNOSIS — I619 Nontraumatic intracerebral hemorrhage, unspecified: Secondary | ICD-10-CM | POA: Diagnosis present

## 2019-05-08 DIAGNOSIS — Z79899 Other long term (current) drug therapy: Secondary | ICD-10-CM

## 2019-05-08 DIAGNOSIS — E876 Hypokalemia: Secondary | ICD-10-CM

## 2019-05-08 DIAGNOSIS — Z9114 Patient's other noncompliance with medication regimen: Secondary | ICD-10-CM

## 2019-05-08 LAB — COMPREHENSIVE METABOLIC PANEL
ALT: 24 U/L (ref 0–44)
AST: 28 U/L (ref 15–41)
Albumin: 4.5 g/dL (ref 3.5–5.0)
Alkaline Phosphatase: 93 U/L (ref 38–126)
Anion gap: 11 (ref 5–15)
BUN: 16 mg/dL (ref 6–20)
CO2: 22 mmol/L (ref 22–32)
Calcium: 9.7 mg/dL (ref 8.9–10.3)
Chloride: 106 mmol/L (ref 98–111)
Creatinine, Ser: 0.85 mg/dL (ref 0.44–1.00)
GFR calc Af Amer: >60 mL/min (ref >60)
GFR calc non Af Amer: >60 mL/min (ref >60)
Glucose, Bld: 106 mg/dL — ABNORMAL HIGH (ref 70–99)
Potassium: 2.9 mmol/L — ABNORMAL LOW (ref 3.5–5.1)
Sodium: 139 mmol/L (ref 135–145)
Total Bilirubin: 0.5 mg/dL (ref 0.3–1.2)
Total Protein: 9 g/dL — ABNORMAL HIGH (ref 6.5–8.1)

## 2019-05-08 LAB — CBC
HCT: 39.3 % (ref 36.0–46.0)
Hemoglobin: 12.7 g/dL (ref 12.0–15.0)
MCH: 32.6 pg (ref 26.0–34.0)
MCHC: 32.3 g/dL (ref 30.0–36.0)
MCV: 101 fL — ABNORMAL HIGH (ref 80.0–100.0)
Platelets: 312 10*3/uL (ref 150–400)
RBC: 3.89 MIL/uL (ref 3.87–5.11)
RDW: 14.5 % (ref 11.5–15.5)
WBC: 9.5 10*3/uL (ref 4.0–10.5)
nRBC: 0 % (ref 0.0–0.2)

## 2019-05-08 LAB — CBG MONITORING, ED: Glucose-Capillary: 86 mg/dL (ref 70–99)

## 2019-05-08 LAB — I-STAT BETA HCG BLOOD, ED (MC, WL, AP ONLY): I-stat hCG, quantitative: <5 m[IU]/mL (ref <5)

## 2019-05-08 LAB — SARS CORONAVIRUS 2 BY RT PCR (HOSPITAL ORDER, PERFORMED IN ~~LOC~~ HOSPITAL LAB): SARS Coronavirus 2: NEGATIVE

## 2019-05-08 MED ORDER — SENNOSIDES-DOCUSATE SODIUM 8.6-50 MG PO TABS: 1.0000 | ORAL_TABLET | Freq: Two times a day (BID) | ORAL | Status: DC

## 2019-05-08 MED ORDER — ACETAMINOPHEN 650 MG RE SUPP: 650.0000 mg | RECTAL | Status: DC | PRN

## 2019-05-08 MED ORDER — PANTOPRAZOLE SODIUM 40 MG IV SOLR: 40.0000 mg | Freq: Every day | INTRAVENOUS | Status: DC

## 2019-05-08 MED ORDER — STROKE: EARLY STAGES OF RECOVERY BOOK
Freq: Once | Status: DC
  Filled 2019-05-08: qty 1

## 2019-05-08 MED ORDER — LABETALOL HCL 5 MG/ML IV SOLN
5.0000 mg | Freq: Once | INTRAVENOUS | Status: AC
  Administered 2019-05-08: 5 mg via INTRAVENOUS
  Filled 2019-05-08: qty 4

## 2019-05-08 MED ORDER — SODIUM CHLORIDE 0.9% FLUSH
3.0000 mL | Freq: Once | INTRAVENOUS | Status: AC
  Administered 2019-05-08: 3 mL via INTRAVENOUS

## 2019-05-08 MED ORDER — POTASSIUM CHLORIDE CRYS ER 20 MEQ PO TBCR: 40.0000 meq | EXTENDED_RELEASE_TABLET | Freq: Once | ORAL | Status: DC

## 2019-05-08 MED ORDER — ACETAMINOPHEN 160 MG/5ML PO SOLN: 650.0000 mg | ORAL | Status: DC | PRN

## 2019-05-08 MED ORDER — ACETAMINOPHEN 325 MG PO TABS: 650.0000 mg | ORAL_TABLET | ORAL | Status: DC | PRN

## 2019-05-08 MED ORDER — LISINOPRIL 10 MG PO TABS
10.0000 mg | ORAL_TABLET | Freq: Once | ORAL | Status: AC
  Administered 2019-05-08: 10 mg via ORAL
  Filled 2019-05-08: qty 1

## 2019-05-08 MED ORDER — AMLODIPINE BESYLATE 5 MG PO TABS
5.0000 mg | ORAL_TABLET | Freq: Once | ORAL | Status: AC
  Administered 2019-05-08: 5 mg via ORAL
  Filled 2019-05-08: qty 1

## 2019-05-08 MED ORDER — CLEVIDIPINE BUTYRATE 0.5 MG/ML IV EMUL
0.0000 mg/h | INTRAVENOUS | Status: DC
  Administered 2019-05-08: 1 mg/h via INTRAVENOUS
  Filled 2019-05-08: qty 50

## 2019-05-08 NOTE — ED Notes (Signed)
Patient in MRI 

## 2019-05-08 NOTE — ED Provider Notes (Signed)
[facility]-EMERGENCY DEPT Provider Note   CSN: [id number] Arrival date & time: 05/08/19  1901    History   Chief Complaint Chief Complaint  Patient presents with  . Facial Droop  . Hypertension    HPI Grace Young is a 53 y.o. female.     53 year old female with prior medical history as detailed below presents for evaluation of right-sided facial weakness and numbness.  Patient reports onset of symptoms 4 days prior.  She reports that she has googled her symptoms at home and determined that she likely has Bell's palsy.   Patient denies other focal weakness.  She denies associated fever, cough, shortness of breath, chest pain, abdominal pain, or other complaint.  She reports that she has a longstanding history of hypertension.  She has not been taking any other antihypertensive for at least the last 2 to 3 months.    The history is provided by the patient and medical records.  Hypertension  This is a chronic problem. The current episode started more than 2 days ago. The problem occurs constantly. The problem has not changed since onset.Pertinent negatives include no chest pain, no abdominal pain, no headaches and no shortness of breath. Nothing aggravates the symptoms. Nothing relieves the symptoms. She has tried nothing for the symptoms.    Past Medical History:  Diagnosis Date  . Hypertension     Patient Active Problem List   Diagnosis Date Noted  . Hypertensive crisis 10/15/2018  . Hypertensive urgency 10/14/2018  . Right ankle pain 10/14/2018    Past Surgical History:  Procedure Laterality Date  . cyst removal from uterus and ovaries    . TONSILLECTOMY       OB History   No obstetric history on file.      Home Medications    Prior to Admission medications   Medication Sig Start Date End Date Taking? Authorizing Provider  amLODipine (NORVASC) 5 MG tablet Take 1 tablet (5 mg total) by mouth daily. 10/18/18   Rhetta MuraSamtani, Jai-Gurmukh, MD   hydrochlorothiazide (HYDRODIURIL) 25 MG tablet Take 1 tablet (25 mg total) by mouth daily. 10/18/18 12/17/18  Rhetta MuraSamtani, Jai-Gurmukh, MD  lisinopril (PRINIVIL,ZESTRIL) 10 MG tablet Take 1 tablet (10 mg total) by mouth daily. 10/18/18   Rhetta MuraSamtani, Jai-Gurmukh, MD  predniSONE (DELTASONE) 20 MG tablet Take 3 tablets (60 mg total) by mouth daily with breakfast. 10/18/18   Rhetta MuraSamtani, Jai-Gurmukh, MD  traMADol (ULTRAM) 50 MG tablet Take 2 tablets (100 mg total) by mouth every 6 (six) hours. 10/18/18   Rhetta MuraSamtani, Jai-Gurmukh, MD    Family History Family History  Problem Relation Age of Onset  . Hypertension Sister   . Breast cancer Cousin   . Diabetes Mellitus II Neg Hx     Social History Social History   Tobacco Use  . Smoking status: Never Smoker  . Smokeless tobacco: Never Used  Substance Use Topics  . Alcohol use: Yes    Alcohol/week: 1.0 standard drinks    Types: 1 Shots of liquor per week  . Drug use: No     Allergies   Patient has no known allergies.   Review of Systems Review of Systems  Respiratory: Negative for shortness of breath.   Cardiovascular: Negative for chest pain.  Gastrointestinal: Negative for abdominal pain.  Neurological: Negative for headaches.  All other systems reviewed and are negative.    Physical Exam Updated Vital Signs BP (!) 198/121   Pulse (!) 101   Temp 99.1 F (37.3 C) (  Oral)   Resp 18   Ht 5\' 8"  (1.727 m)   Wt 106.6 kg   SpO2 99%   BMI 35.73 kg/m   Physical Exam Vitals signs and nursing note reviewed.  Constitutional:      General: She is not in acute distress.    Appearance: Normal appearance. She is well-developed.  HENT:     Head: Normocephalic and atraumatic.  Eyes:     Conjunctiva/sclera: Conjunctivae normal.     Pupils: Pupils are equal, round, and reactive to light.  Neck:     Musculoskeletal: Normal range of motion and neck supple.  Cardiovascular:     Rate and Rhythm: Normal rate and regular rhythm.     Heart sounds:  Normal heart sounds.  Pulmonary:     Effort: Pulmonary effort is normal. No respiratory distress.     Breath sounds: Normal breath sounds.  Abdominal:     General: There is no distension.     Palpations: Abdomen is soft.     Tenderness: There is no abdominal tenderness.  Musculoskeletal: Normal range of motion.        General: No deformity.  Skin:    General: Skin is warm and dry.  Neurological:     Mental Status: She is alert and oriented to person, place, and time. Mental status is at baseline.     Motor: No weakness.     Coordination: Coordination normal.     Gait: Gait normal.     Deep Tendon Reflexes: Reflexes normal.     Comments: Right facial droop consistent with likely Bell's palsy  No vesicular lesion noted  Right forehead muscles are not sparred        ED Treatments / Results  Labs (all labs ordered are listed, but only abnormal results are displayed) Labs Reviewed  COMPREHENSIVE METABOLIC PANEL - Abnormal; Notable for the following components:      Result Value   Potassium 2.9 (*)    Glucose, Bld 106 (*)    Total Protein 9.0 (*)    All other components within normal limits  CBC - Abnormal; Notable for the following components:   MCV 101.0 (*)    All other components within normal limits  SARS CORONAVIRUS 2 (HOSPITAL ORDER, PERFORMED IN [facility])  CBG MONITORING, ED  I-STAT BETA HCG BLOOD, ED ([facility], [facility], [facility] ONLY)    EKG EKG Interpretation  Date/Time:  Friday May 08 2019 19:20:37 EDT Ventricular Rate:  115 PR Interval:    QRS Duration: 88 QT Interval:  336 QTC Calculation: 465 R Axis:   32 Text Interpretation:  Sinus tachycardia Probable left atrial enlargement Borderline repolarization abnormality Confirmed by Kristine Royal [phone]) on 05/08/2019 7:32:32 PM   Radiology Ct Head Wo Contrast  Result Date: 05/08/2019 CLINICAL DATA:  Right-sided facial droop. Hypertension. Slurred speech. EXAM: CT HEAD WITHOUT CONTRAST TECHNIQUE:  Contiguous axial images were obtained from the base of the skull through the vertex without intravenous contrast. COMPARISON:  October 14, 2018 FINDINGS: Brain: The ventricles are normal in size and configuration. There is no intracranial mass, hemorrhage, extra-axial fluid collection, or midline shift. There is patchy small vessel disease in the centra semiovale bilaterally. Small vessel disease is somewhat more confluent in the posterior superior centra semiovale bilaterally, slightly more severe on the right than on the left, stable. There is no appreciable acute infarct. Vascular: There is no hyperdense vessel. There is no appreciable vascular calcification. Skull: The bony calvarium appears intact. Sinuses/Orbits: There  is mucosal thickening in several ethmoid air cells. Other visualized paranasal sinuses are clear. Orbits appear symmetric bilaterally. Other: Mastoid air cells are clear. 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 No acute infarct evident. No mass or hemorrhage. There is mucosal thickening in several ethmoid air cells. Electronically Signed   By: Bretta Bang III M.D.   On: 05/08/2019 20:07    Procedures Procedures (including critical care time) CRITICAL CARE Performed by: Wynetta Fines   Total critical care time: 30  minutes  Critical care time was exclusive of separately billable procedures and treating other patients.  Critical care was necessary to treat or prevent imminent or life-threatening deterioration.  Critical care was time spent personally by me on the following activities: development of treatment plan with patient and/or surrogate as well as nursing, discussions with consultants, evaluation of patient's response to treatment, examination of patient, obtaining history from patient or surrogate, ordering and performing treatments and  interventions, ordering and review of laboratory studies, ordering and review of radiographic studies, pulse oximetry and re-evaluation of patient's condition.   Medications Ordered in ED Medications  potassium chloride SA (K-DUR) CR tablet 40 mEq (has no administration in time range)  sodium chloride flush (NS) 0.9 % injection 3 mL (3 mLs Intravenous Given 05/08/19 1959)  amLODipine (NORVASC) tablet 5 mg (5 mg Oral Given 05/08/19 1956)  lisinopril (ZESTRIL) tablet 10 mg (10 mg Oral Given 05/08/19 1956)  labetalol (NORMODYNE) injection 5 mg (5 mg Intravenous Given 05/08/19 1955)     Initial Impression / Assessment and Plan / ED Course  I have reviewed the triage vital signs and the nursing notes.  Pertinent labs & imaging results that were available during my care of the patient were reviewed by me and considered in my medical decision making (see chart for details).        MDM  Screen complete  Grace Young was evaluated in Emergency Department on 05/08/2019 for the symptoms described in the history of present illness. She was evaluated in the context of the global COVID-19 pandemic, which necessitated consideration that the patient might be at risk for infection with the SARS-CoV-2 virus that causes COVID-19. Institutional protocols and algorithms that pertain to the evaluation of patients at risk for COVID-19 are in a state of rapid change based on information released by regulatory bodies including the CDC and federal and state organizations. These policies and algorithms were followed during the patient's care in the ED.   Patient is presenting for evaluation of right-sided facial weakness.  Patient's exam is consistent with likely Bell's palsy.  Patient is noted to be fairly hypertensive upon arrival.  Blood pressure is initially 246 systolic.  Patient has not been compliant with blood pressure medications for quite some time.  Case discussed with neurology.  Dr. Laurence Slate agrees with  plan for admission to better control blood pressure and also to obtain MRI.  Hospitalist service is aware of case.  Patient will be evaluated for admission.  COVID testing and MRI brain pending at time of admission.    Final Clinical Impressions(s) / ED Diagnoses   Final diagnoses:  Hypertensive urgency  Bell's palsy    ED Discharge Orders    None       Wynetta Fines, MD 05/08/19 2112

## 2019-05-08 NOTE — H&P (Addendum)
History and Physical    Alyana Zobel BMW:[id number] DOB: [birth year] DOA: 05/08/2019  PCP: System, Pcp Not In  Patient coming from: home   Chief Complaint: facial paralysis  HPI: Grace Young is a 53 y.o. female with medical history significant for uncontrolled hypertension and obesity, who presents w/ above.  Symptoms began 5 days ago. Felt an odd sensation right side of face. Noticed couldn't fully close right eye. Then noticed paralysis right side of face. No ear pain or eye pain. No other weakness or numbness or trouble walking or speaking or swallowing. Has had mild headaches. No abdominal pain, no changes in urination. No chest pain or shortness of breath. No history stroke. Presented today because symptoms not improving.  Hospitalized last year for hypertensive urgency. Did not re-start medications, had difficulty obtaining them. Doesn't take anything otc. Denies hx vomiting or diarrhea.  ED Course: labetalol, amldoipine, lisinorpil, potassium chloride. Labs, ct head. Neurology consult, advised admit for MRI  Review of Systems: As per HPI otherwise 10 point review of systems negative.    Past Medical History:  Diagnosis Date  . Hypertension     Past Surgical History:  Procedure Laterality Date  . cyst removal from uterus and ovaries    . TONSILLECTOMY       reports that she has never smoked. She has never used smokeless tobacco. She reports current alcohol use of about 1.0 standard drinks of alcohol per week. She reports that she does not use drugs.  No Known Allergies  Family History  Problem Relation Age of Onset  . Hypertension Sister   . Breast cancer Cousin   . Diabetes Mellitus II Neg Hx     Prior to Admission medications   Medication Sig Start Date End Date Taking? Authorizing Provider  amLODipine (NORVASC) 5 MG tablet Take 1 tablet (5 mg total) by mouth daily. 10/18/18   Rhetta Mura, MD  hydrochlorothiazide (HYDRODIURIL) 25 MG tablet Take 1 tablet  (25 mg total) by mouth daily. 10/18/18 12/17/18  Rhetta Mura, MD  lisinopril (PRINIVIL,ZESTRIL) 10 MG tablet Take 1 tablet (10 mg total) by mouth daily. 10/18/18   Rhetta Mura, MD  predniSONE (DELTASONE) 20 MG tablet Take 3 tablets (60 mg total) by mouth daily with breakfast. 10/18/18   Rhetta Mura, MD  traMADol (ULTRAM) 50 MG tablet Take 2 tablets (100 mg total) by mouth every 6 (six) hours. 10/18/18   Rhetta Mura, MD    Physical Exam: Vitals:   05/08/19 1955 05/08/19 1956 05/08/19 2130 05/08/19 2134  BP: (!) 198/121  (!) 199/160 (!) 199/160  Pulse: (!) 101  94 97  Resp: 18   19  Temp:      TempSrc:      SpO2: 99%  97% 98%  Weight:  106.6 kg    Height:   (1.727 m)      Constitutional: No acute distress Head: Atraumatic, right sided paralysis Eyes: Conjunctiva clear ENM: Moist mucous membranes. Normal dentition. Normal canal and TM on right Neck: Supple Respiratory: Clear to auscultation bilaterally, no wheezing/rales/rhonchi. Normal respiratory effort. No accessory muscle use. . Cardiovascular: Regular rate and rhythm. No murmurs/rubs/gallops. Abdomen: Non-tender, non-distended. No masses. No rebound or guarding. Positive bowel sounds. Musculoskeletal: No joint deformity upper and lower extremities. Normal ROM, no contractures. Normal muscle tone.  Skin: No rashes, lesions, or ulcers.  Extremities: No peripheral edema. Palpable peripheral pulses. Neurologic: Alert, movAnnessa Satre extremities. Cn 2-12 intact save for right sided paralysis including partial forehead. 5/5 upper  and lower strength, normal finger to nose. Psychiatric: Normal insight and judgement.   Labs on Admission: I have personally reviewed following labs and imaging studies  CBC: Recent Labs  Lab 05/08/19 1927  WBC 9.5  HGB 12.7  HCT 39.3  MCV 101.0*  PLT 312   Basic Metabolic Panel: Recent Labs  Lab 05/08/19 1927  NA 139  K 2.9*  CL 106  CO2 22  GLUCOSE  106*  BUN 16  CREATININE 0.85  CALCIUM 9.7   GFR: Estimated Creatinine Clearance: 97.9 mL/min (by C-G formula based on SCr of 0.85 mg/dL). Liver Function Tests: Recent Labs  Lab 05/08/19 1927  AST 28  ALT 24  ALKPHOS 93  BILITOT 0.5  PROT 9.0*  ALBUMIN 4.5   No results for input(s): LIPASE, AMYLASE in the last 168 hours. No results for input(s): AMMONIA in the last 168 hours. Coagulation Profile: No results for input(s): INR, PROTIME in the last 168 hours. Cardiac Enzymes: No results for input(s): CKTOTAL, CKMB, CKMBINDEX, TROPONINI in the last 168 hours. BNP (last 3 results) No results for input(s): PROBNP in the last 8760 hours. HbA1C: No results for input(s): HGBA1C in the last 72 hours. CBG: Recent Labs  Lab 05/08/19 1952  GLUCAP 86   Lipid Profile: No results for input(s): CHOL, HDL, LDLCALC, TRIG, CHOLHDL, LDLDIRECT in the last 72 hours. Thyroid Function Tests: No results for input(s): TSH, T4TOTAL, FREET4, T3FREE, THYROIDAB in the last 72 hours. Anemia Panel: No results for input(s): VITAMINB12, FOLATE, FERRITIN, TIBC, IRON, RETICCTPCT in the last 72 hours. Urine analysis:    Component Value Date/Time   COLORURINE YELLOW 10/14/2018 1750   APPEARANCEUR CLEAR 10/14/2018 1750   LABSPEC 1.012 10/14/2018 1750   PHURINE 8.0 10/14/2018 1750   GLUCOSEU NEGATIVE 10/14/2018 1750   HGBUR LARGE (A) 10/14/2018 1750   BILIRUBINUR NEGATIVE 10/14/2018 1750   KETONESUR NEGATIVE 10/14/2018 1750   PROTEINUR 100 (A) 10/14/2018 1750   NITRITE NEGATIVE 10/14/2018 1750   LEUKOCYTESUR NEGATIVE 10/14/2018 1750    Radiological Exams on Admission: Ct Head Wo Contrast  Result Date: 05/08/2019 CLINICAL DATA:  Right-sided facial droop. Hypertension. Slurred speech. EXAM: CT HEAD WITHOUT CONTRAST TECHNIQUE: Contiguous axial images were obtained from the base of the skull through the vertex without intravenous contrast. COMPARISON:  October 14, 2018 FINDINGS: Brain: The ventricles  are normal in size and configuration. There is no intracranial mass, hemorrhage, extra-axial fluid collection, or midline shift. There is patchy small vessel disease in the centra semiovale bilaterally. Small vessel disease is somewhat more confluent in the posterior superior centra semiovale bilaterally, slightly more severe on the right than on the left, stable. There is no appreciable acute infarct. Vascular: There is no hyperdense vessel. There is no appreciable vascular calcification. Skull: The bony calvarium appears intact. Sinuses/Orbits: There is mucosal thickening in several ethmoid air cells. Other visualized paranasal sinuses are clear. Orbits appear symmetric bilaterally. Other: Mastoid air cells are clear. 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 No acute infarct evident. No mass or hemorrhage. There is mucosal thickening in several ethmoid air cells. Electronically Signed   By: Bretta Bang III M.D.   On: 05/08/2019 20:07    EKG: Independently reviewed. nsr  Assessment/Plan Principal Problem:   Hypertensive urgency Active Problems:   Hypokalemia   Bell's palsy   # Hypertensive urgency - essentially asymptomatic. Likely coincidental bells palsy, but given degree of bp elevation, ED  discussed w/ neurology (aurora) who thinks prudent to admit for MRI in AM. Normal kidney function. With hypokalemia suspect underlying hyperaldosteronism. covid screen pending. - start chlorthalidone 25 mg and lisinopril 20 mg, hydral IV prn - check UDS - risk start labs: tsh, a1c, lipids - check aldosterone/renin - MRI head - AM bmp  # hypokalemia - 2.9. suspect hyperaldosteronism. Not on meds, no GI losses. Bicarb wnl - plasma renin/aldosterone - urine potassium and creatinine - 40 meq ordered in ed, I will order 40 meq more  # Bells palsy - no signs of ear/eye involvement.  Mod/severe. - eye lubrication - start prednisone 60 qd, valacyclovir 1000 tid, though not ideal that we are starting 5-6 days after symptom onset - outpt ophtho f/u - mri as above  # macrocytosis - mild, no anemia - f/u b12/folate   ADDENDUM - contacted by dr aroora, he ordered for thElon Spanneris patient orders intended for other patient. Later, on my review, his order of clevidipine was still active. I discontinued it but it had already been started.   DVT prophylaxis: lovenox Code Status: full  Family Communication: sister loretta [phone]  Disposition Plan: tbd  Consults called: neurology  Admission status: stepdown (nursing informed floor won't accept given bps)   Silvano BilisNoah B Shanine Kreiger MD [facility] Pager [phone]  If 7PM-7AM, please contact night-coverage [email or web address] Password [email or web address]  05/08/2019, 10:50 PM

## 2019-05-08 NOTE — ED Triage Notes (Addendum)
Patient here from home with complaints of hypertension and right side facial droop. Reports being out of BP medication for "awhile. Right sided facial droop noted with slurred speech, unable to blink right eye, states that she has no control over it and it just stays open and waters constantly. Ambulatory. No other deficits.

## 2019-05-08 NOTE — ED Notes (Signed)
ED TO INPATIENT HANDOFF REPORT  ED Nurse Name and Phone #: Sharene Skeans RN [phone]  S Name/Age/Gender Grace Young 53 y.o. female Room/Bed: RESA/RESA  Code Status   Code Status: Prior  Home/SNF/Other Home Patient oriented to: self, place, time and situation Is this baseline? Yes   Triage Complete: Triage complete  Chief Complaint Facial droop  Triage Note Patient here from home with complaints of hypertension and right side facial droop. Reports being out of BP medication for "awhile. Right sided facial droop noted with slurred speech, unable to blink right eye, states that she has no control over it and it just stays open and waters constantly. Ambulatory. No other deficits.    Allergies No Known Allergies  Level of Care/Admitting Diagnosis ED Disposition    ED Disposition Condition Comment   Admit  Hospital Area: [facility] [[id number]]  Level of Care: ICU [6]  Covid Evaluation: Person Under Investigation (PUI)  Isolation Risk Level: High Risk/Airborne (Aerosolizing procedure, nebulizer, intubated/ventilation, CPAP/BiPAP)  Diagnosis: ICH (intracerebral hemorrhage) [facility]) [[id number]]  Admitting Physician: Elba Barman [[id number]]  Attending Physician: Elba Barman [[id number]]  Estimated length of stay: 3 - 4 days  Certification:: I certify this patient will need inpatient services for at least 2 midnights  PT Class (Do Not Modify): Inpatient [101]  PT Acc Code (Do Not Modify): Private [1]       B Medical/Surgery History Past Medical History:  Diagnosis Date  . Hypertension    Past Surgical History:  Procedure Laterality Date  . cyst removal from uterus and ovaries    . TONSILLECTOMY       A IV Location/Drains/Wounds Patient Lines/Drains/Airways Status   Active Line/Drains/Airways    Name:   Placement date:   Placement time:   Site:   Days:   Peripheral IV 05/08/19 Left Hand   05/08/19    1934    Hand   less than 1   External  Urinary Catheter   10/14/18    2200    -   206          Intake/Output Last 24 hours  Intake/Output Summary (Last 24 hours) at 05/08/2019 2342 Last data filed at 05/08/2019 1959 Gross per 24 hour  Intake 3 ml  Output -  Net 3 ml    Labs/Imaging Results for orders placed or performed during the hospital encounter of 05/08/19 (from the past 48 hour(s))  Comprehensive metabolic panel     Status: Abnormal   Collection Time: 05/08/19  7:27 PM  Result Value Ref Range   Sodium 139 135 - 145 mmol/L   Potassium 2.9 (L) 3.5 - 5.1 mmol/L   Chloride 106 98 - 111 mmol/L   CO2 22 22 - 32 mmol/L   Glucose, Bld 106 (H) 70 - 99 mg/dL   BUN 16 6 - 20 mg/dL   Creatinine, Ser 5.78 0.44 - 1.00 mg/dL   Calcium 9.7 8.9 - 46.9 mg/dL   Total Protein 9.0 (H) 6.5 - 8.1 g/dL   Albumin 4.5 3.5 - 5.0 g/dL   AST 28 15 - 41 U/L   ALT 24 0 - 44 U/L   Alkaline Phosphatase 93 38 - 126 U/L   Total Bilirubin 0.5 0.3 - 1.2 mg/dL   GFR calc non Af Amer >60 >60 mL/min   GFR calc Af Amer >60 >60 mL/min   Anion gap 11 5 - 15    Comment: Performed at [facility], [address]  [place], [place] [address]  CBC     Status: Abnormal   Collection Time: 05/08/19  7:27 PM  Result Value Ref Range   WBC 9.5 4.0 - 10.5 K/uL   RBC 3.89 3.87 - 5.11 MIL/uL   Hemoglobin 12.7 12.0 - 15.0 g/dL   HCT 26.2 03.5 - 59.7 %   MCV 101.0 (H) 80.0 - 100.0 fL   MCH 32.6 26.0 - 34.0 pg   MCHC 32.3 30.0 - 36.0 g/dL   RDW 41.6 38.4 - 53.6 %   Platelets 312 150 - 400 K/uL   nRBC 0.0 0.0 - 0.2 %    Comment: Performed at [facility], [address] [place], [place] [address]  I-Stat beta hCG blood, ED     Status: None   Collection Time: 05/08/19  7:32 PM  Result Value Ref Range   I-stat hCG, quantitative <5.0 <5 mIU/mL   Comment 3            Comment:   GEST. AGE      CONC.  (mIU/mL)   <=1 WEEK        5 - 50     2 WEEKS       50 - 500     3 WEEKS       100 - 10,000     4 WEEKS     1,000 -  30,000        FEMALE AND NON-PREGNANT FEMALE:     LESS THAN 5 mIU/mL   CBG monitoring, ED     Status: None   Collection Time: 05/08/19  7:52 PM  Result Value Ref Range   Glucose-Capillary 86 70 - 99 mg/dL  SARS Coronavirus 2 (CEPHEID - Performed in [facility] hospital lab), Hosp Order     Status: None   Collection Time: 05/08/19  8:59 PM  Result Value Ref Range   SARS Coronavirus 2 NEGATIVE NEGATIVE    Comment: (NOTE) If result is NEGATIVE SARS-CoV-2 target nucleic acids are NOT DETECTED. The SARS-CoV-2 RNA is generally detectable in upper and lower  respiratory specimens during the acute phase of infection. The lowest  concentration of SARS-CoV-2 viral copies this assay can detect is 250  copies / mL. A negative result does not preclude SARS-CoV-2 infection  and should not be used as the sole basis for treatment or other  patient management decisions.  A negative result may occur with  improper specimen collection / handling, submission of specimen other  than nasopharyngeal swab, presence of viral mutation(s) within the  areas targeted by this assay, and inadequate number of viral copies  (<250 copies / mL). A negative result must be combined with clinical  observations, patient history, and epidemiological information. If result is POSITIVE SARS-CoV-2 target nucleic acids are DETECTED. The SARS-CoV-2 RNA is generally detectable in upper and lower  respiratory specimens dur ing the acute phase of infection.  Positive  results are indicative of active infection with SARS-CoV-2.  Clinical  correlation with patient history and other diagnostic information is  necessary to determine patient infection status.  Positive results do  not rule out bacterial infection or co-infection with other viruses. If result is PRESUMPTIVE POSTIVE SARS-CoV-2 nucleic acids MAY BE PRESENT.   A presumptive positive result was obtained on the submitted specimen  and confirmed on repeat testing.  While  2019 novel coronavirus  (SARS-CoV-2) nucleic acids may be present in the submitted sample  additional confirmatory testing may be necessary for  epidemiological  and / or clinical management purposes  to differentiate between  SARS-CoV-2 and other Sarbecovirus currently known to infect humans.  If clinically indicated additional testing with an alternate test  methodology [phone]) is advised. The SARS-CoV-2 RNA is generally  detectable in upper and lower respiratory sp ecimens during the acute  phase of infection. The expected result is Negative. Fact Sheet for Patients:  BoilerBrush.com.cy Fact Sheet for Healthcare Providers: https://pope.com/ This test is not yet approved or cleared by the [place] FDA and has been authorized for detection and/or diagnosis of SARS-CoV-2 by FDA under an Emergency Use Authorization (EUA).  This EUA will remain in effect (meaning this test can be used) for the duration of the COVID-19 declaration under Section 564(b)(1) of the Act, 21 U.S.C. section 360bbb-3(b)(1), unless the authorization is terminated or revoked sooner. Performed at [facility], [address] [place], [place] [address]    Ct Head Wo Contrast  Result Date: 05/08/2019 CLINICAL DATA:  Right-sided facial droop. Hypertension. Slurred speech. EXAM: CT HEAD WITHOUT CONTRAST TECHNIQUE: Contiguous axial images were obtained from the base of the skull through the vertex without intravenous contrast. COMPARISON:  October 14, 2018 FINDINGS: Brain: The ventricles are normal in size and configuration. There is no intracranial mass, hemorrhage, extra-axial fluid collection, or midline shift. There is patchy small vessel disease in the centra semiovale bilaterally. Small vessel disease is somewhat more confluent in the posterior superior centra semiovale bilaterally, slightly more severe on the right than on the left, stable. There  is no appreciable acute infarct. Vascular: There is no hyperdense vessel. There is no appreciable vascular calcification. Skull: The bony calvarium appears intact. Sinuses/Orbits: There is mucosal thickening in several ethmoid air cells. Other visualized paranasal sinuses are clear. Orbits appear symmetric bilaterally. Other: Mastoid air cells are clear. 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 No acute infarct evident. No mass or hemorrhage. There is mucosal thickening in several ethmoid air cells. Electronically Signed   By: Bretta Bang III M.D.   On: 05/08/2019 20:07   Mr Brain Wo Contrast (neuro Protocol)  Result Date: 05/08/2019 CLINICAL DATA:  Hypertension and right-sided facial droop. Slurred speech. EXAM: MRI HEAD WITHOUT CONTRAST TECHNIQUE: Multiplanar, multiecho pulse sequences of the brain and surrounding structures were obtained without intravenous contrast. COMPARISON:  Head CT 05/08/2019 FINDINGS: BRAIN: There is no acute infarct, acute hemorrhage or extra-axial collection. The midline structures are normal. No midline shift or other mass effect. Early confluent hyperintense T2-weighted signal of the periventricular and deep white matter, most commonly due to chronic ischemic microangiopathy. Generalized atrophy without lobar predilection. Single focus of chronic microhemorrhage in the right basal ganglia. No mass lesion. VASCULAR: The major intracranial arterial and venous sinus flow voids are normal. SKULL AND UPPER CERVICAL SPINE: Calvarial bone marrow signal is normal. There is no skull base mass. Visualized upper cervical spine and soft tissues are normal. SINUSES/ORBITS: No fluid levels or advanced mucosal thickening. No mastoid or middle ear effusion. The orbits are normal. 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  Electronically Signed   By: Deatra Robinson M.D.   On: 05/08/2019 23:11    Pending Labs [employer] (From admission, onward)    Start     Ordered   Signed and Held  CBC  (enoxaparin (LOVENOX)    CrCl >/= 30 ml/min)  Once,   R    Comments:  Baseline for enoxaparin therapy IF NOT ALREADY DRAWN.  Notify MD if PLT < 100 K.    Signed and Held   Signed and Held  Creatinine, serum  (enoxaparin (LOVENOX)    CrCl >/= 30 ml/min)  Once,   R    Comments:  Baseline for enoxaparin therapy IF NOT ALREADY DRAWN.    Signed and Held   Signed and Held  Creatinine, serum  (enoxaparin (LOVENOX)    CrCl >/= 30 ml/min)  Weekly,   R    Comments:  while on enoxaparin therapy    Signed and Held   Signed and Held  Vitamin B12  Once,   R     Signed and Held   Signed and Held  Folate RBC  Once,   R     Signed and Held   Signed and Held  Rapid urine drug screen (hospital performed)  ONCE - STAT,   R     Signed and Held   Signed and Held  Urinalysis, Routine w reflex microscopic  Once,   R     Signed and Held   Signed and Held  TSH  Once,   R     Signed and Held   Signed and Held  Hemoglobin A1c  Once,   R     Signed and Held   Signed and Held  Lipid panel  Once,   R     Signed and Held   Signed and Held  HCV Ab Reflex to Quant PCR  Once,   R     Signed and Held   Signed and Held  Aldosterone + renin activity w/ ratio  Once,   R     Signed and Held          Vitals/Pain Today's Vitals   05/08/19 1956 05/08/19 2130 05/08/19 2134 05/08/19 2329  BP:  (!) 199/160 (!) 199/160 (!) 199/131  Pulse:  94 97 87  Resp:   19 18  Temp:    98.7 F (37.1 C)  TempSrc:    Oral  SpO2:  97% 98% 97%  Weight: 106.6 kg     Height:  (1.727 m)     PainSc:   0-No pain 0-No pain    Isolation Precautions No active isolations  Medications Medications  clevidipine (CLEVIPREX) infusion 0.5 mg/mL (has no administration in time range)  sodium chloride flush (NS) 0.9 % injection 3 mL (3 mLs Intravenous Given  05/08/19 1959)  amLODipine (NORVASC) tablet 5 mg (5 mg Oral Given 05/08/19 1956)  lisinopril (ZESTRIL) tablet 10 mg (10 mg Oral Given 05/08/19 1956)  labetalol (NORMODYNE) injection 5 mg (5 mg Intravenous Given 05/08/19 1955)    Mobility walks Low fall risk   Focused Assessments Neuro Assessment Handoff:  Swallow screen pass? Yes    NIH Stroke Scale ( + Modified Stroke Scale Criteria)  Interval: Initial Level of Consciousness (1a.)   : Alert, keenly responsive LOC Questions (1b. )   +: Answers both questions correctly LOC Commands (1c. )   + : Performs both tasks correctly Best Gaze (2. )  +: Normal Visual (3. )  +: No visual loss Facial Palsy (4. )    : Partial paralysis  Motor Arm, Left (5a. )   +: No drift Motor Arm, Right (5b. )   +: No drift Motor Leg, Left (6a. )   +: No drift Motor Leg, Right (6b. )   +: No drift Limb Ataxia (7. ):  Absent Sensory (8. )   +: Normal, no sensory loss Best Language (9. )   +: No aphasia Dysarthria (10. ): Normal Extinction/Inattention (11.)   +: No Abnormality Modified SS Total  +: 0 Complete NIHSS TOTAL: 2     Neuro Assessment: Exceptions to WDL Neuro Checks:   Initial (05/08/19 2011)  Last Documented NIHSS Modified Score: 0 (05/08/19 2011) Has TPA been given? No If patient is a Neuro Trauma and patient is going to OR before floor call report to 4N Charge nurse: [phone] or [phone]     R Recommendations: See Admitting Provider Note  Report given to:   Additional Notes:

## 2019-05-08 NOTE — ED Notes (Signed)
Report to Jordan RN

## 2019-05-08 NOTE — ED Notes (Signed)
Called to give report. Nurse will call back 

## 2019-05-09 ENCOUNTER — Encounter (HOSPITAL_COMMUNITY): Payer: Self-pay | Admitting: Emergency Medicine

## 2019-05-09 ENCOUNTER — Inpatient Hospital Stay (HOSPITAL_COMMUNITY): Payer: Self-pay

## 2019-05-09 DIAGNOSIS — I160 Hypertensive urgency: Secondary | ICD-10-CM

## 2019-05-09 DIAGNOSIS — E876 Hypokalemia: Secondary | ICD-10-CM

## 2019-05-09 LAB — LIPID PANEL
Cholesterol: 209 mg/dL — ABNORMAL HIGH (ref 0–200)
HDL: 59 mg/dL (ref >40)
LDL Cholesterol: 83 mg/dL (ref 0–99)
Total CHOL/HDL Ratio: 3.5 RATIO
Triglycerides: 334 mg/dL — ABNORMAL HIGH (ref <150)
VLDL: 67 mg/dL — ABNORMAL HIGH (ref 0–40)

## 2019-05-09 LAB — CBC
HCT: 39 % (ref 36.0–46.0)
Hemoglobin: 12.7 g/dL (ref 12.0–15.0)
MCH: 33.2 pg (ref 26.0–34.0)
MCHC: 32.6 g/dL (ref 30.0–36.0)
MCV: 101.8 fL — ABNORMAL HIGH (ref 80.0–100.0)
Platelets: 293 10*3/uL (ref 150–400)
RBC: 3.83 MIL/uL — ABNORMAL LOW (ref 3.87–5.11)
RDW: 14.6 % (ref 11.5–15.5)
WBC: 8.4 10*3/uL (ref 4.0–10.5)
nRBC: 0 % (ref 0.0–0.2)

## 2019-05-09 LAB — BASIC METABOLIC PANEL
Anion gap: 15 (ref 5–15)
BUN: 16 mg/dL (ref 6–20)
CO2: 18 mmol/L — ABNORMAL LOW (ref 22–32)
Calcium: 10.1 mg/dL (ref 8.9–10.3)
Chloride: 104 mmol/L (ref 98–111)
Creatinine, Ser: 1.07 mg/dL — ABNORMAL HIGH (ref 0.44–1.00)
GFR calc Af Amer: >60 mL/min (ref >60)
GFR calc non Af Amer: 59 mL/min — ABNORMAL LOW (ref >60)
Glucose, Bld: 128 mg/dL — ABNORMAL HIGH (ref 70–99)
Potassium: 4.1 mmol/L (ref 3.5–5.1)
Sodium: 137 mmol/L (ref 135–145)

## 2019-05-09 LAB — URINALYSIS, ROUTINE W REFLEX MICROSCOPIC
Bilirubin Urine: NEGATIVE
Glucose, UA: NEGATIVE mg/dL
Ketones, ur: NEGATIVE mg/dL
Leukocytes,Ua: NEGATIVE
Nitrite: NEGATIVE
Protein, ur: NEGATIVE mg/dL
Specific Gravity, Urine: 1.003 — ABNORMAL LOW (ref 1.005–1.030)
pH: 6 (ref 5.0–8.0)

## 2019-05-09 LAB — RAPID URINE DRUG SCREEN, HOSP PERFORMED
Amphetamines: NOT DETECTED
Barbiturates: NOT DETECTED
Benzodiazepines: NOT DETECTED
Cocaine: NOT DETECTED
Opiates: NOT DETECTED
Tetrahydrocannabinol: NOT DETECTED

## 2019-05-09 LAB — CREATININE, SERUM
Creatinine, Ser: 0.86 mg/dL (ref 0.44–1.00)
GFR calc Af Amer: >60 mL/min (ref >60)
GFR calc non Af Amer: >60 mL/min (ref >60)

## 2019-05-09 LAB — VITAMIN B12: Vitamin B-12: 111 pg/mL — ABNORMAL LOW (ref 180–914)

## 2019-05-09 LAB — TSH: TSH: 1.817 u[IU]/mL (ref 0.350–4.500)

## 2019-05-09 LAB — HEMOGLOBIN A1C
Hgb A1c MFr Bld: 5.4 % (ref 4.8–5.6)
Mean Plasma Glucose: 108.28 mg/dL

## 2019-05-09 MED ORDER — VALACYCLOVIR HCL 500 MG PO TABS
1000.0000 mg | ORAL_TABLET | Freq: Three times a day (TID) | ORAL | Status: DC
  Administered 2019-05-09 – 2019-05-10 (×5): 1000 mg via ORAL
  Filled 2019-05-09 (×6): qty 2

## 2019-05-09 MED ORDER — CHLORHEXIDINE GLUCONATE CLOTH 2 % EX PADS
6.0000 | MEDICATED_PAD | Freq: Every day | CUTANEOUS | Status: DC
  Administered 2019-05-09 – 2019-05-10 (×2): 6 via TOPICAL

## 2019-05-09 MED ORDER — LABETALOL HCL 5 MG/ML IV SOLN
10.0000 mg | Freq: Once | INTRAVENOUS | Status: AC
  Administered 2019-05-09: 10 mg via INTRAVENOUS
  Filled 2019-05-09: qty 4

## 2019-05-09 MED ORDER — POTASSIUM CHLORIDE CRYS ER 20 MEQ PO TBCR
40.0000 meq | EXTENDED_RELEASE_TABLET | Freq: Once | ORAL | Status: AC
  Administered 2019-05-09: 40 meq via ORAL
  Filled 2019-05-09: qty 2

## 2019-05-09 MED ORDER — HYDROCHLOROTHIAZIDE 12.5 MG PO CAPS
12.5000 mg | ORAL_CAPSULE | Freq: Every day | ORAL | Status: DC
  Administered 2019-05-09 – 2019-05-10 (×2): 12.5 mg via ORAL
  Filled 2019-05-09 (×2): qty 1

## 2019-05-09 MED ORDER — PREDNISONE 20 MG PO TABS
60.0000 mg | ORAL_TABLET | Freq: Every day | ORAL | Status: DC
  Administered 2019-05-09 – 2019-05-10 (×2): 60 mg via ORAL
  Filled 2019-05-09 (×2): qty 3

## 2019-05-09 MED ORDER — AMLODIPINE BESYLATE 5 MG PO TABS
5.0000 mg | ORAL_TABLET | Freq: Once | ORAL | Status: AC
  Administered 2019-05-09: 5 mg via ORAL
  Filled 2019-05-09: qty 1

## 2019-05-09 MED ORDER — GADOBUTROL 1 MMOL/ML IV SOLN
5.0000 mL | Freq: Once | INTRAVENOUS | Status: AC | PRN
  Administered 2019-05-09: 10 mL via INTRAVENOUS

## 2019-05-09 MED ORDER — POLYVINYL ALCOHOL 1.4 % OP SOLN
1.0000 [drp] | OPHTHALMIC | Status: DC
  Administered 2019-05-09 – 2019-05-10 (×12): 1 [drp] via OPHTHALMIC
  Filled 2019-05-09: qty 15

## 2019-05-09 MED ORDER — HYDRALAZINE HCL 20 MG/ML IJ SOLN
10.0000 mg | INTRAMUSCULAR | Status: DC | PRN
  Administered 2019-05-09 (×2): 10 mg via INTRAVENOUS
  Filled 2019-05-09 (×3): qty 1

## 2019-05-09 MED ORDER — LABETALOL HCL 5 MG/ML IV SOLN
10.0000 mg | INTRAVENOUS | Status: DC | PRN
  Administered 2019-05-09 (×4): 10 mg via INTRAVENOUS
  Filled 2019-05-09 (×4): qty 4

## 2019-05-09 MED ORDER — LISINOPRIL 10 MG PO TABS
20.0000 mg | ORAL_TABLET | Freq: Every day | ORAL | Status: DC
  Administered 2019-05-09 – 2019-05-10 (×2): 20 mg via ORAL
  Filled 2019-05-09 (×2): qty 2

## 2019-05-09 MED ORDER — ENOXAPARIN SODIUM 40 MG/0.4ML ~~LOC~~ SOLN
40.0000 mg | Freq: Every day | SUBCUTANEOUS | Status: DC
  Filled 2019-05-09 (×3): qty 0.4

## 2019-05-09 NOTE — ED Notes (Signed)
Report attempted and was told that patient was coming to unit from another facility and to call back later.

## 2019-05-09 NOTE — Progress Notes (Signed)
[facility]  PROGRESS NOTE  Grace Young ZOX:[id number] DOB: Jul 13, 1966 DOA: 05/08/2019 PCP: System, Pcp Not In   Brief HPI:   53 year old female with a history of uncontrolled hypertension, obesity came with right-sided facial paralysis.  Patient diagnosed with Bell's palsy, hypertensive urgency.  MRI brain without contrast was negative.  MRI brain with contrast has been ordered.    Subjective   Patient seen and examined, denies any complaints.  Continues to have right facial droop.   Assessment/Plan:     1. Bell's palsy- Continue prednisone 60 mg daily, valacyclovir 1000 mg 3 times daily, not ideal started 5 to 6 days after symptom onset.  Patient to follow-up ophthalmology as outpatient.  Continue eye lubrication.  MRI brain without contrast was negative in the ED.  MRI brain with contrast has been ordered to rule out any underlying tumor compressing on the facial nerve, and is currently pending.  2. Hypertensive urgency-blood pressure has been elevated.  Patient was hypokalemic on presentation.  Concern for hyperaldosteronism.  Aldosterone to renin ratio has been ordered and is currently pending.  Will restart hydrochlorthiazide 12.5 mg daily, lisinopril 20 mg daily.  Amlodipine 5 mg daily.Will start labetalol 10 mg IV every 2 hours as needed.  3. Hypokalemia-potassium was 2.9 on presentation.  Replaced in the ED.  Will check BMP today  and replace as needed.     CBG: Recent Labs  Lab 05/08/19 1952  GLUCAP 86    CBC: Recent Labs  Lab 05/08/19 1927 05/09/19 0513  WBC 9.5 8.4  HGB 12.7 12.7  HCT 39.3 39.0  MCV 101.0* 101.8*  PLT 312 293    Basic Metabolic Panel: Recent Labs  Lab 05/08/19 1927 05/09/19 0228  NA 139  --   K 2.9*  --   CL 106  --   CO2 22  --   GLUCOSE 106*  --   BUN 16  --   CREATININE 0.85 0.86  CALCIUM 9.7  --      DVT prophylaxis: Lovenox  Code Status: Full code  Family Communication: No family at bedside  Disposition Plan:  likely home when medically ready for discharge     Consultants:    Procedures:     Antibiotics:   Anti-infectives (From admission, onward)   Start     Dose/Rate Route Frequency Ordered Stop   05/09/19 0045  valACYclovir (VALTREX) tablet 1,000 mg     1,000 mg Oral 3 times daily 05/09/19 0037         Objective   Vitals:   05/09/19 0918 05/09/19 1100 05/09/19 1130 05/09/19 1200  BP: (!) 213/136 (!) 167/94  (S) (!) 195/98  Pulse:  (!) 103  (!) 116  Resp:  19  (!) 21  Temp:   98.2 F (36.8 C)   TempSrc:   Oral   SpO2:  98%  99%  Weight:      Height:        Intake/Output Summary (Last 24 hours) at 05/09/2019 1417 Last data filed at 05/09/2019 0745 Gross per 24 hour  Intake 226.05 ml  Output -  Net 226.05 ml   Filed Weights   05/08/19 1956 05/09/19 0343  Weight: 106.6 kg 113.4 kg     Physical Examination:  General-appears in no acute distress Heart-S1-S2, regular, no murmur auscultated Lungs-clear to auscultation bilaterally, no wheezing or crackles auscultated Abdomen-soft, nontender, no organomegaly Extremities-no edema in the lower extremities Neuro-alert, oriented x3, right-sided facial droop, impaired blinking and inability to close  right eye    Data Reviewed: I have personally reviewed following labs and imaging studies   Recent Results (from the past 240 hour(s))  SARS Coronavirus 2 (CEPHEID - Performed in [facility] hospital lab), Hosp Order     Status: None   Collection Time: 05/08/19  8:59 PM  Result Value Ref Range Status   SARS Coronavirus 2 NEGATIVE NEGATIVE Final    Comment: (NOTE) If result is NEGATIVE SARS-CoV-2 target nucleic acids are NOT DETECTED. The SARS-CoV-2 RNA is generally detectable in upper and lower  respiratory specimens during the acute phase of infection. The lowest  concentration of SARS-CoV-2 viral copies this assay can detect is 250  copies / mL. A negative result does not preclude SARS-CoV-2 infection  and should  not be used as the sole basis for treatment or other  patient management decisions.  A negative result may occur with  improper specimen collection / handling, submission of specimen other  than nasopharyngeal swab, presence of viral mutation(s) within the  areas targeted by this assay, and inadequate number of viral copies  (<250 copies / mL). A negative result must be combined with clinical  observations, patient history, and epidemiological information. If result is POSITIVE SARS-CoV-2 target nucleic acids are DETECTED. The SARS-CoV-2 RNA is generally detectable in upper and lower  respiratory specimens dur ing the acute phase of infection.  Positive  results are indicative of active infection with SARS-CoV-2.  Clinical  correlation with patient history and other diagnostic information is  necessary to determine patient infection status.  Positive results do  not rule out bacterial infection or co-infection with other viruses. If result is PRESUMPTIVE POSTIVE SARS-CoV-2 nucleic acids MAY BE PRESENT.   A presumptive positive result was obtained on the submitted specimen  and confirmed on repeat testing.  While 2019 novel coronavirus  (SARS-CoV-2) nucleic acids may be present in the submitted sample  additional confirmatory testing may be necessary for epidemiological  and / or clinical management purposes  to differentiate between  SARS-CoV-2 and other Sarbecovirus currently known to infect humans.  If clinically indicated additional testing with an alternate test  methodology [phone]([id number]) is advised. The SARS-CoV-2 RNA is generally  detectable in upper and lower respiratory sp ecimens during the acute  phase of infection. The expected result is Negative. Fact Sheet for Patients:  BoilerBrush.com.cyhttps://www.fda.gov/media/136312/download Fact Sheet for Healthcare Providers: https://pope.com/https://www.fda.gov/media/136313/download This test is not yet approved or cleared by the [place] FDA and has been  authorized for detection and/or diagnosis of SARS-CoV-2 by FDA under an Emergency Use Authorization (EUA).  This EUA will remain in effect (meaning this test can be used) for the duration of the COVID-19 declaration under Section 564(b)(1) of the Act, 21 U.S.C. section 360bbb-3(b)(1), unless the authorization is terminated or revoked sooner. Performed at [facility], [address] [place], [place] [id number]      Liver Function Tests: Recent Labs  Lab 05/08/19 1927  AST 28  ALT 24  ALKPHOS 93  BILITOT 0.5  PROT 9.0*  ALBUMIN 4.5   No results for input(s): LIPASE, AMYLASE in the last 168 hours. No results for input(s): AMMONIA in the last 168 hours.  Cardiac Enzymes: No results for input(s): CKTOTAL, CKMB, CKMBINDEX, TROPONINI in the last 168 hours. BNP (last 3 results) Recent Labs    10/14/18 1902  BNP 101.9*    ProBNP (last 3 results) No results for input(s): PROBNP in the last 8760 hours.    Studies: Ct Head  Wo Contrast  Result Date: 05/08/2019 CLINICAL DATA:  Right-sided facial droop. Hypertension. Slurred speech. EXAM: CT HEAD WITHOUT CONTRAST TECHNIQUE: Contiguous axial images were obtained from the base of the skull through the vertex without intravenous contrast. COMPARISON:  October 14, 2018 FINDINGS: Brain: The ventricles are normal in size and configuration. There is no intracranial mass, hemorrhage, extra-axial fluid collection, or midline shift. There is patchy small vessel disease in the centra semiovale bilaterally. Small vessel disease is somewhat more confluent in the posterior superior centra semiovale bilaterally, slightly more severe on the right than on the left, stable. There is no appreciable acute infarct. Vascular: There is no hyperdense vessel. There is no appreciable vascular calcification. Skull: The bony calvarium appears intact. Sinuses/Orbits: There is mucosal thickening in several ethmoid air cells. Other visualized  paranasal sinuses are clear. Orbits appear symmetric bilaterally. Other: Mastoid air cells are clear. 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 No acute infarct evident. No mass or hemorrhage. There is mucosal thickening in several ethmoid air cells. Electronically Signed   By: Bretta Bang III M.D.   On: 05/08/2019 20:07   Mr Brain Wo Contrast (neuro Protocol)  Result Date: 05/08/2019 CLINICAL DATA:  Hypertension and right-sided facial droop. Slurred speech. EXAM: MRI HEAD WITHOUT CONTRAST TECHNIQUE: Multiplanar, multiecho pulse sequences of the brain and surrounding structures were obtained without intravenous contrast. COMPARISON:  Head CT 05/08/2019 FINDINGS: BRAIN: There is no acute infarct, acute hemorrhage or extra-axial collection. The midline structures are normal. No midline shift or other mass effect. Early confluent hyperintense T2-weighted signal of the periventricular and deep white matter, most commonly due to chronic ischemic microangiopathy. Generalized atrophy without lobar predilection. Single focus of chronic microhemorrhage in the right basal ganglia. No mass lesion. VASCULAR: The major intracranial arterial and venous sinus flow voids are normal. SKULL AND UPPER CERVICAL SPINE: Calvarial bone marrow signal is normal. There is no skull base mass. Visualized upper cervical spine and soft tissues are normal. SINUSES/ORBITS: No fluid levels or advanced mucosal thickening. No mastoid or middle ear effusion. The orbits are normal. 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 Electronically Signed   By: Deatra Robinson M.D.   On: 05/08/2019 23:11    Scheduled Meds: . Chlorhexidine Gluconate Cloth  6 each Topical Daily  . enoxaparin (LOVENOX) injection  40 mg Subcutaneous Daily  . hydrochlorothiazide  12.5 mg Oral Daily  .  lisinopril  20 mg Oral Daily  . polyvinyl alcohol  1 drop Right Eye Q2H while awake  . predniSONE  60 mg Oral Q breakfast  . valACYclovir  1,000 mg Oral TID    Admission status: Inpatient: Based on patients clinical presentation and evaluation of above clinical data, I have made determination that patient meets Inpatient criteria at this time.  Time spent:   Meredeth Ide   [facility] Pager [phone]. If 7PM-7AM, please contact night-coverage at [email or web address], Office  [phone]  password [email or web address]  05/09/2019, 2:17 PM  LOS: 1 day

## 2019-05-09 NOTE — Progress Notes (Signed)
BP 195/98. Patient is refusing PRN hydralazine, stating it makes her feel "bad." Patient educated on purpose and effects of hydralazine. Dr. Sharl Ma paged, awaiting orders. Will continue to monitor

## 2019-05-09 NOTE — Plan of Care (Signed)
  Problem: Education: Goal: Knowledge of General Education information will improve Description Including pain rating scale, medication(s)/side effects and non-pharmacologic comfort measures Outcome: Progressing   

## 2019-05-09 NOTE — ED Notes (Signed)
Tried to call report. They stated that they have to much going on and the patient has to wait.

## 2019-05-09 NOTE — Consult Note (Signed)
Requesting Physician: Dr. Rodena Medin    Chief Complaint: Right facial droop  History obtained from: Patient and Chart   HPI:                                                                                                                                       Grace Young is an 53 y.o. female with past medical history of hypertension, chronic headache presents to the ER with 5-day history of gradual facial weakness  She states that her symptoms first started Sunday when she was cooking she felt irritation in her right eye.  By Monday she noticed she had some difficulty blinking and some numbness on the right side of her face.  This progressed such that by Tuesday she felt that her side of her face was swollen.  She called urgent care over the phone who recommended her to apply warm compresses which did not help.  Her daughter was concerned, and called urgent care again who recommended the patient go to the emergency room.  When she arrived to the emergency room her blood pressure initially was 246 systolic.  On examination, EDP felt that she likely had Bell's palsy however given her high blood pressure felt she needed admission.  Neurology was also consulted and given her high blood pressure, I recommended MRI to rule out brainstem stroke.  On review of systems, patient states that she has a headache that is throbbing in the back of her head that has been going on and off for 2 to 3 days, that began after her right sided numbness, difficulty blinking.  She does have a history of headaches in the past they are usually in the back of the head, throbbing in nature, associated with photophobia and occur almost daily since Oct 2019, improves with tylenol or ibuprofen. However, headaches improved when she was taking her antihypertensive medications, but is no longer taking them.   Past Medical History:  Diagnosis Date  . Hypertension     Past Surgical History:  Procedure Laterality Date  . cyst removal  from uterus and ovaries    . TONSILLECTOMY      Family History  Problem Relation Age of Onset  . Hypertension Sister   . Breast cancer Cousin   . Diabetes Mellitus II Neg Hx    Social History:  reports that she has never smoked. She has never used smokeless tobacco. She reports current alcohol use of about 1.0 standard drinks of alcohol per week. She reports that she does not use drugs.  Allergies: No Known Allergies  Medications:  I reviewed home medications   ROS:                                                                                                                                     14 systems reviewed and negative except above.  Denies any fever, tick bites.   Examination:                                                                                                      General: Appears well-developed and well-nourished.  Psych: Affect appropriate to situation Eyes: No scleral injection HENT: No OP obstrucion Head: Normocephalic.  Cardiovascular: Normal rate and regular rhythm.  Respiratory: Effort normal and breath sounds normal to anterior ascultation GI: Soft.  No distension. There is no tenderness.  Skin: WDI    Neurological Examination Mental Status: Alert, oriented, thought content appropriate.  Speech fluent without evidence of aphasia. Able to follow 3 step commands without difficulty. Cranial Nerves: II: Visual fields grossly normal,  III,IV, VI: ptosis not present, extra-ocular motions intact bilaterally, pupils equal, round, reactive to light and accommodation V,VII: Lower motor neuron facial palsy with significant facial droop, impaired blinking and ability to close eyes on the right side.  Bell's phenomenon in present on the right eye.  VIII: hearing normal bilaterally IX,X: uvula rises symmetrically XI: bilateral shoulder  shrug XII: midline tongue extension Motor: Right : Upper extremity   5/5    Left:     Upper extremity   5/5  Lower extremity   5/5     Lower extremity   5/5 Tone and bulk:normal tone throughout; no atrophy noted Sensory: Pinprick and light touch intact throughout, bilaterally Deep Tendon Reflexes: 2+ and symmetric throughout Plantars: Right: downgoing   Left: downgoing Cerebellar: normal finger-to-nose, normal rapid alternating movements and normal heel-to-shin test Gait: normal gait and station     Lab Results: Basic Metabolic Panel: Recent Labs  Lab 05/08/19 1927 05/09/19 0228  NA 139  --   K 2.9*  --   CL 106  --   CO2 22  --   GLUCOSE 106*  --   BUN 16  --   CREATININE 0.85 0.86  CALCIUM 9.7  --     CBC: Recent Labs  Lab 05/08/19 1927 05/09/19 0513  WBC 9.5 8.4  HGB 12.7 12.7  HCT 39.3 39.0  MCV 101.0* 101.8*  PLT 312 293    Coagulation Studies: No results for input(s): LABPROT, INR in the last 72 hours.  Imaging: Ct Head Wo Contrast  Result Date: 05/08/2019 CLINICAL DATA:  Right-sided facial droop. Hypertension. Slurred speech. EXAM: CT HEAD WITHOUT CONTRAST TECHNIQUE: Contiguous axial images were obtained from the base of the skull through the vertex without intravenous contrast. COMPARISON:  October 14, 2018 FINDINGS: Brain: The ventricles are normal in size and configuration. There is no intracranial mass, hemorrhage, extra-axial fluid collection, or midline shift. There is patchy small vessel disease in the centra semiovale bilaterally. Small vessel disease is somewhat more confluent in the posterior superior centra semiovale bilaterally, slightly more severe on the right than on the left, stable. There is no appreciable acute infarct. Vascular: There is no hyperdense vessel. There is no appreciable vascular calcification. Skull: The bony calvarium appears intact. Sinuses/Orbits: There is mucosal thickening in several ethmoid air cells. Other visualized  paranasal sinuses are clear. Orbits appear symmetric bilaterally. Other: Mastoid air cells are clear. 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 No acute infarct evident. No mass or hemorrhage. There is mucosal thickening in several ethmoid air cells. Electronically Signed   By: Bretta BangWilliam  Woodruff III M.D.   On: 05/08/2019 20:07   Mr Brain Wo Contrast (neuro Protocol)  Result Date: 05/08/2019 CLINICAL DATA:  Hypertension and right-sided facial droop. Slurred speech. EXAM: MRI HEAD WITHOUT CONTRAST TECHNIQUE: Multiplanar, multiecho pulse sequences of the brain and surrounding structures were obtained without intravenous contrast. COMPARISON:  Head CT 05/08/2019 FINDINGS: BRAIN: There is no acute infarct, acute hemorrhage or extra-axial collection. The midline structures are normal. No midline shift or other mass effect. Early confluent hyperintense T2-weighted signal of the periventricular and deep white matter, most commonly due to chronic ischemic microangiopathy. Generalized atrophy without lobar predilection. Single focus of chronic microhemorrhage in the right basal ganglia. No mass lesion. VASCULAR: The major intracranial arterial and venous sinus flow voids are normal. SKULL AND UPPER CERVICAL SPINE: Calvarial bone marrow signal is normal. There is no skull base mass. Visualized upper cervical spine and soft tissues are normal. SINUSES/ORBITS: No fluid levels or advanced mucosal thickening. No mastoid or middle ear effusion. The orbits are normal. 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 Electronically Signed   By: Deatra RobinsonKevin  Herman M.D.   On: 05/08/2019 23:11     I have reviewed the above imaging : MRI brain.   ASSESSMENT AND PLAN  53 year old female with history of migraines, hypertension presents with  right-sided lower motor neuron facial  palsy.  Also noted to be significantly hypertensive in the emergency room.  MRI brain obtained ruled out stroke.  Patient has mild numbness/tingling on the right side of face which can be seen in some cases of Bell's palsy.  No vesicles in the ear.  Extraocular movements are intact.  Bell's palsy -Agree with continuing steroids and acyclovir although less beneficial even presentation 5 days after onset -MRI brain negative for acute stroke, recommend MRI brain with contrast to look for any compressive lesions/tumors that is impinging against the facial nerve. -Eyepatch and teardrops  Hypertensive headache vs migraine - less likely this a complicated migraine as she is not complaining of severe headache  - f/u with outpatient neurology - continue antuhypertensive medications  - Hypertensive emergency Management per hospitalist   Follow-up with neurology as outpatient for headaches and Bell's palsy  Anaissa Macfadden [facility] Pager Number [phone]

## 2019-05-09 NOTE — ED Notes (Signed)
Report given to nurse on 2w.

## 2019-05-10 LAB — MRSA PCR SCREENING: MRSA by PCR: NEGATIVE

## 2019-05-10 MED ORDER — POLYVINYL ALCOHOL 1.4 % OP SOLN: 1.0000 [drp] | OPHTHALMIC | 2 refills | Status: DC

## 2019-05-10 MED ORDER — OLOPATADINE HCL 0.1 % OP SOLN
1.0000 [drp] | Freq: Two times a day (BID) | OPHTHALMIC | Status: DC
  Filled 2019-05-10: qty 5

## 2019-05-10 MED ORDER — METOPROLOL TARTRATE 25 MG PO TABS: 25.0000 mg | ORAL_TABLET | Freq: Two times a day (BID) | ORAL | 2 refills | Status: DC

## 2019-05-10 MED ORDER — PREDNISONE 20 MG PO TABS: 60.0000 mg | ORAL_TABLET | Freq: Every day | ORAL | 0 refills | Status: AC

## 2019-05-10 MED ORDER — LISINOPRIL 20 MG PO TABS: 20.0000 mg | ORAL_TABLET | Freq: Every day | ORAL | 3 refills | Status: DC

## 2019-05-10 MED ORDER — VALACYCLOVIR HCL 1 G PO TABS: 1000.0000 mg | ORAL_TABLET | Freq: Three times a day (TID) | ORAL | 0 refills | Status: AC

## 2019-05-10 MED ORDER — AMLODIPINE BESYLATE 5 MG PO TABS: 5.0000 mg | ORAL_TABLET | Freq: Every day | ORAL | Status: DC

## 2019-05-10 MED ORDER — METOPROLOL TARTRATE 25 MG PO TABS
25.0000 mg | ORAL_TABLET | Freq: Two times a day (BID) | ORAL | Status: DC
  Administered 2019-05-10: 25 mg via ORAL
  Filled 2019-05-10: qty 1

## 2019-05-10 MED ORDER — AMLODIPINE BESYLATE 10 MG PO TABS: 10.0000 mg | ORAL_TABLET | Freq: Every day | ORAL | 3 refills | Status: DC

## 2019-05-10 MED ORDER — AMLODIPINE BESYLATE 10 MG PO TABS
10.0000 mg | ORAL_TABLET | Freq: Every day | ORAL | Status: DC
  Administered 2019-05-10: 10 mg via ORAL
  Filled 2019-05-10: qty 1

## 2019-05-10 MED ORDER — HYDROCHLOROTHIAZIDE 12.5 MG PO CAPS: 12.5000 mg | ORAL_CAPSULE | Freq: Every day | ORAL | 3 refills | Status: DC

## 2019-05-10 NOTE — Discharge Instructions (Addendum)
Preventing Hypertension Hypertension, commonly called high blood pressure, is when the force of blood pumping through the arteries is too strong. Arteries are blood vessels that carry blood from the heart throughout the body. Over time, hypertension can damage the arteries and decrease blood flow to important parts of the body, including the brain, heart, and kidneys. Often, hypertension does not cause symptoms until blood pressure is very high. For this reason, it is important to have your blood pressure checked on a regular basis. Hypertension can often be prevented with diet and lifestyle changes. If you already have hypertension, you can control it with diet and lifestyle changes, as well as medicine. What nutrition changes can be made? Maintain a healthy diet. This includes:  Eating less salt (sodium). Ask your health care provider how much sodium is safe for you to have. The general recommendation is to consume less than 1 tsp (2,300 mg) of sodium a day. ? Do not add salt to your food. ? Choose low-sodium options when grocery shopping and eating out.  Limiting fats in your diet. You can do this by eating low-fat or fat-free dairy products and by eating less red meat.  Eating more fruits, vegetables, and whole grains. Make a goal to eat: ? 1-2 cups of fresh fruits and vegetables each day. ? 3-4 servings of whole grains each day.  Avoiding foods and beverages that have added sugars.  Eating fish that contain healthy fats (omega-3 fatty acids), such as mackerel or salmon. If you need help putting together a healthy eating plan, try the DASH diet. This diet is high in fruits, vegetables, and whole grains. It is low in sodium, red meat, and added sugars. DASH stands for Dietary Approaches to Stop Hypertension. What lifestyle changes can be made?   Lose weight if you are overweight. Losing just 3?5% of your body weight can help prevent or control hypertension. ? For example, if your  present weight is 200 lb (91 kg), a loss of 3-5% of your weight means losing 6-10 lb (2.7-4.5 kg). ? Ask your health care provider to help you with a diet and exercise plan to safely lose weight.  Get enough exercise. Do at least 150 minutes of moderate-intensity exercise each week. ? You could do this in short exercise sessions several times a day, or you could do longer exercise sessions a few times a week. For example, you could take a brisk 10-minute walk or bike ride, 3 times a day, for 5 days a week.  Find ways to reduce stress, such as exercising, meditating, listening to music, or taking a yoga class. If you need help reducing stress, ask your health care provider.  Do not smoke. This includes e-cigarettes. Chemicals in tobacco and nicotine products raise your blood pressure each time you smoke. If you need help quitting, ask your health care provider.  Avoid alcohol. If you drink alcohol, limit alcohol intake to no more than 1 drink a day for nonpregnant women and 2 drinks a day for men. One drink equals 12 oz of beer, 5 oz of wine, or 1 oz of hard liquor. Why are these changes important? Diet and lifestyle changes can help you prevent hypertension, and they may make you feel better overall and improve your quality of life. If you have hypertension, making these changes will help you control it and help prevent major complications, such as:  Hardening and narrowing of arteries that supply blood to: ? Your heart. This can cause a  heart attack. ? Your brain. This can cause a stroke. ? Your kidneys. This can cause kidney failure.  Stress on your heart muscle, which can cause heart failure. What can I do to lower my risk?  Work with your health care provider to make a hypertension prevention plan that works for you. Follow your plan and keep all follow-up visits as told by your health care provider.  Learn how to check your blood pressure at home. Make sure that you know your personal  target blood pressure, as told by your health care provider. How is this treated? In addition to diet and lifestyle changes, your health care provider may recommend medicines to help lower your blood pressure. You may need to try a few different medicines to find what works best for you. You also may need to take more than one medicine. Take over-the-counter and prescription medicines only as told by your health care provider. Where to find support Your health care provider can help you prevent hypertension and help you keep your blood pressure at a healthy level. Your local hospital or your community may also provide support services and prevention programs. The American Heart Association offers an online support network at: https://www.lee.net/http://supportnetwork.heart.org/high-blood-pressure Where to find more information Learn more about hypertension from:  National Heart, Lung, and Blood Institute: https://www.peterson.org/www.nhlbi.nih.gov/health/health-topics/topics/hbp  Centers for Disease Control and Prevention: AboutHD.co.nzwww.cdc.gov/bloodpressure  American Academy of Family Physicians: http://familydoctor.org/familydoctor/en/diseases-conditions/high-blood-pressure.printerview.all.html Learn more about the DASH diet from:  National Heart, Lung, and Blood Institute: WedMap.itwww.nhlbi.nih.gov/health/health-topics/topics/dash Contact a health care provider if:  You think you are having a reaction to medicines you have taken.  You have recurrent headaches or feel dizzy.  You have swelling in your ankles.  You have trouble with your vision. Summary  Hypertension often does not cause any symptoms until blood pressure is very high. It is important to get your blood pressure checked regularly.  Diet and lifestyle changes are the most important steps in preventing hypertension.  By keeping your blood pressure in a healthy range, you can prevent complications like heart attack, heart failure, stroke, and kidney failure.  Work with your health  care provider to make a hypertension prevention plan that works for you. This information is not intended to replace advice given to you by your health care provider. Make sure you discuss any questions you have with your health care provider. Document Released: 12/25/2015 Document Revised: 08/20/2016 Document Reviewed: 08/20/2016 Elsevier Interactive Patient Education  2019 Elsevier Inc. Alvester MorinBell Palsy, Adult  Bell palsy is a short-term inability to move muscles in part of the face. The inability to move (paralysis) results from inflammation or compression of the facial nerve, which travels along the skull and under the ear to the side of the face (7th cranial nerve). This nerve is responsible for facial movements that include blinking, closing the eyes, smiling, and frowning. What are the causes? The exact cause of this condition is not known. It may be caused by an infection from a virus, such as the chickenpox (herpes zoster), Epstein-Barr, or mumps virus. What increases the risk? You are more likely to develop this condition if:  You are pregnant.  You have diabetes.  You have had a recent infection in your nose, throat, or airways (upper respiratory infection).  You have a weakened body defense system (immune system).  You have had a facial injury, such as a fracture.  You have a family history of Bell palsy. What are the signs or symptoms? Symptoms of this condition include:  Weakness on one side of the face.  Drooping eyelid and corner of the mouth.  Excessive tearing in one eye.  Difficulty closing the eyelid.  Dry eye.  Drooling.  Dry mouth.  Changes in taste.  Change in facial appearance.  Pain behind one ear.  Ringing in one or both ears.  Sensitivity to sound in one ear.  Facial twitching.  Headache.  Impaired speech.  Dizziness.  Difficulty eating or drinking. Most of the time, only one side of the face is affected. Rarely, Bell palsy affects the  whole face. How is this diagnosed? This condition is diagnosed based on:  Your symptoms.  Your medical history.  A physical exam. You may also have to see health care providers who specialize in disorders of the nerves (neurologist) or diseases and conditions of the eye (ophthalmologist). You may have tests, such as:  A test to check for nerve damage (electromyogram).  Imaging studies, such as CT or MRI scans.  Blood tests. How is this treated? This condition affects every person differently. Sometimes symptoms go away without treatment within a couple weeks. If treatment is needed, it varies from person to person. The goal of treatment is to reduce inflammation and protect the eye from damage. Treatment for Bell palsy may include:  Medicines, such as: ? Steroids to reduce swelling and inflammation. ? Antiviral drugs. ? Pain relievers, including aspirin, acetaminophen, or ibuprofen.  Eye drops or ointment to keep your eye moist.  Eye protection, if you cannot close your eye.  Exercises or massage to regain muscle strength and function (physical therapy). Follow these instructions at home:   Take over-the-counter and prescription medicines only as told by your health care provider.  If your eye is affected: ? Keep your eye moist with eye drops or ointment as told by your health care provider. ? Follow instructions for eye care and protection as told by your health care provider.  Do any physical therapy exercises as told by your health care provider.  Keep all follow-up visits as told by your health care provider. This is important. Contact a health care provider if:  You have a fever.  Your symptoms do not get better within 2-3 weeks, or your symptoms get worse.  Your eye is red, irritated, or painful.  You have new symptoms. Get help right away if:  You have weakness or numbness in a part of your body other than your face.  You have trouble swallowing.  You  develop neck pain or stiffness.  You develop dizziness or shortness of breath. Summary  Bell palsy is a short-term inability to move muscles in part of the face. The inability to move (paralysis) results from inflammation or compression of the facial nerve.  This condition affects every person differently. Sometimes symptoms go away without treatment within a couple weeks.  If treatment is needed, it varies from person to person. The goal of treatment is to reduce inflammation and protect the eye from damage.  Contact your health care provider if your symptoms do not get better within 2-3 weeks, or your symptoms get worse. This information is not intended to replace advice given to you by your health care provider. Make sure you discuss any questions you have with your health care provider. Document Released: 12/10/2005 Document Revised: 11/08/2017 Document Reviewed: 02/12/2017 Elsevier Interactive Patient Education  2019 [employer].
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
## 2019-05-10 NOTE — TOC Initial Note (Signed)
Transition of Care [facility]) - Initial/Assessment Note    Patient Details  Name: Grace Young MRN: [id number] Date of Birth: May 18, 1966  Transition of Care [facility]) CM/SW Contact:    Elliot Cousin, RN Phone Number: 05/10/2019, 12:48 PM  Clinical Narrative:                 Spoke to pt and provided pt with MATCH, with $3 copay and once per year use. Contacted [facility] her meds were over $200. Pt states she only had $20. Faxed MATCH letter to [facility]. Will call [facility] to arrange appt to establish with PCP on 05/11/2019. Verified pt's cell number.   Expected Discharge Plan: Home/Self Care Barriers to Discharge: No Barriers Identified   Patient Goals and CMS Choice     Choice offered to / list presented to : NA  Expected Discharge Plan and Services Expected Discharge Plan: Home/Self Care   Discharge Planning Services: [facility]   Living arrangements for the past 2 months: Apartment Expected Discharge Date: 05/10/19                                    Prior Living Arrangements/Services Living arrangements for the past 2 months: Apartment Lives with:: Self Patient language and need for interpreter reviewed:: Yes Do you feel safe going back to the place where you live?: Yes      Need for Family Participation in Patient Care: No (Comment) Care giver support system in place?: No (comment)   Criminal Activity/Legal Involvement Pertinent to Current Situation/Hospitalization: No - Comment as needed  Activities of Daily Living Home Assistive Devices/Equipment: None ADL Screening (condition at time of admission) Patient's cognitive ability adequate to safely complete daily activities?: Yes Is the patient deaf or have difficulty hearing?: No Does the patient have difficulty seeing, even when wearing glasses/contacts?: No Does the patient have difficulty concentrating, remembering, or making decisions?: No Patient able to express need for assistance with ADLs?: Yes Does the  patient have difficulty dressing or bathing?: No Independently performs ADLs?: Yes (appropriate for developmental age) Does the patient have difficulty walking or climbing stairs?: No Weakness of Legs: None Weakness of Arms/Hands: None  Permission Sought/Granted Permission sought to share information with : Case Manager, PCP, Family Supports, Other (comment) Permission granted to share information with : Yes, Verbal Permission Granted  Share Information with NAME: Adjua      Permission granted to share info w Relationship: daughter  Permission granted to share info w Contact Information: [phone]  Emotional Assessment Appearance:: Appears stated age Attitude/Demeanor/Rapport: Engaged, Gracious Affect (typically observed): Accepting Orientation: : Oriented to Self, Oriented to Place, Oriented to  Time, Oriented to Situation   Psych Involvement: No (comment)  Admission diagnosis:  Bell's palsy [G51.0] Hypertensive urgency [I16.0] Patient Active Problem List   Diagnosis Date Noted  . Hypokalemia 05/08/2019  . Bell's palsy 05/08/2019  . ICH (intracerebral hemorrhage) (HCC) 05/08/2019  . Hypertensive crisis 10/15/2018  . Hypertensive urgency 10/14/2018  . Right ankle pain 10/14/2018   PCP:  System, Pcp Not In Pharmacy:   [facility] [address], [place] - [address] [place] [address] Phone: [phone] Fax: [phone]     Social Determinants of Health (SDOH) Interventions    Readmission Risk Interventions No flowsheet data found.

## 2019-05-11 LAB — HCV INTERPRETATION

## 2019-05-11 LAB — FOLATE RBC
Folate, Hemolysate: 277 ng/mL
Folate, RBC: 755 ng/mL (ref >498)
Hematocrit: 36.7 % (ref 34.0–46.6)

## 2019-05-11 LAB — HCV AB W REFLEX TO QUANT PCR: HCV Ab: <0.1 s/co ratio (ref 0.0–0.9)

## 2019-05-13 LAB — ALDOSTERONE + RENIN ACTIVITY W/ RATIO
ALDO / PRA Ratio: <3.7 (ref 0.0–30.0)
Aldosterone: <1 ng/dL (ref 0.0–30.0)
PRA LC/MS/MS: 0.269 ng/mL/hr (ref 0.167–5.380)

## 2019-05-15 ENCOUNTER — Telehealth: Payer: Self-pay | Admitting: *Deleted

## 2019-05-19 ENCOUNTER — Telehealth: Payer: Self-pay | Admitting: *Deleted

## 2019-05-19 NOTE — Telephone Encounter (Signed)
Attempted call to pt to make aware of scheduled follow up appt with [facility] on 05/25/2019 at 1030 am.  Left message for return call. Isidoro Donning RN CCM Case Mgmt phone [phone]

## 2019-05-25 ENCOUNTER — Telehealth: Payer: Self-pay | Admitting: *Deleted

## 2019-05-25 ENCOUNTER — Inpatient Hospital Stay: Payer: Medicaid Other | Admitting: Internal Medicine

## 2019-05-25 NOTE — Telephone Encounter (Signed)
[facility] ED TOC CM follow up- PCP appointment  05/24/2019 (late entry) 3:07 pm Contacted pt via phone to remind her of appt on 05/25/2019, after speaking to pt. She states they she does not have transportation for 6/1. Will reschedule appt on 05/25/2019.   05/25/2019 9:14 am Contacted [facility] to reschedule appt. Appt rescheduled for 06/02/2019 at 2:30 pm. NCM notified pt of her new appt time. Pt thankful for assistance. Educated pt on the importance of follow up appt to ensure she does not experience any complications with current medical conditions. Isidoro Donning RN CCM Case Mgmt phone [phone]

## 2019-06-02 ENCOUNTER — Other Ambulatory Visit: Payer: Self-pay

## 2019-06-02 ENCOUNTER — Encounter: Payer: Self-pay | Admitting: Nurse Practitioner

## 2019-06-02 ENCOUNTER — Ambulatory Visit: Payer: MEDICAID | Admitting: Nurse Practitioner

## 2019-06-02 DIAGNOSIS — I10 Essential (primary) hypertension: Secondary | ICD-10-CM

## 2019-06-02 DIAGNOSIS — G510 Bell's palsy: Secondary | ICD-10-CM

## 2019-06-02 NOTE — Progress Notes (Signed)
Virtual Visit via Telephone Note Due to national recommendations of social distancing due to COVID 19, telehealth visit is felt to be most appropriate for this patient at this time.  I discussed the limitations, risks, security and privacy concerns of performing an evaluation and management service by telephone and the availability of in person appointments. I also discussed with the patient that there may be a patient responsible charge related to this service. The patient expressed understanding and agreed to proceed.    I connected with Grace Young on 06/03/19  at   2:30 PM EDT  EDT by telephone and verified that I am speaking with the correct person using two identifiers.   Consent I discussed the limitations, risks, security and privacy concerns of performing an evaluation and management service by telephone and the availability of in person appointments. I also discussed with the patient that there may be a patient responsible charge related to this service. The patient expressed understanding and agreed to proceed.   Location of Patient: Private Residence   [facility]    Persons participating in Telemedicine visit: Bertram DenverZelda Fleming FNP-BC YY GordonBien CMA Grace DexterIngrid Salley    History of Present Illness: Telemedicine visit for: Hospital Follow Up and Establish care  HFU (05-08-2019 through 05-10-2019) Admitted to the hospital for Hypertensive urgency, bells palsy and ICD. She was started on prednisone, valacyclovir, HCTZ 12.5 mg, metoprolol 25 mg BID and amlodipine 10 mg daily. HCTZ was decreased from 25 mg to 12.5 mg due to hypokalemia. Due to her inability to close her right eye she was given eye lubricants to use prn.   ESSENTIAL HYPERTENSION She took her blood pressure over the phone today. 179/108 HR 114 Bp still too high. She states "it's always high". She has been on multiple meds in the past with a history of noncompliance. Will  increase lisinopril to 40mg , and switch metoprolol to carvedilol . DC HCTZ as creatinine has increased and due to hypokalemia.  Continue amlodipine 10mg  daily. Denies chest pain, shortness of breath, palpitations, lightheadedness, dizziness or BLE edema. She does not smoke.  BP Readings from Last 3 Encounters:  05/10/19 (!) 167/86  10/18/18 (!) 176/96  07/26/18 (!) 165/104    Past Medical History:  Diagnosis Date  . Bell's palsy   . Hypertension     Past Surgical History:  Procedure Laterality Date  . cyst removal from uterus and ovaries    . TONSILLECTOMY      Family History  Problem Relation Age of Onset  . Hypertension Sister   . Breast cancer Cousin   . Diabetes Mellitus II Neg Hx   . Diabetes Neg Hx     Social History   Socioeconomic History  . Marital status: Single    Spouse name: Not on file  . Number of children: Not on file  . Years of education: Not on file  . Highest education level: Not on file  Occupational History  . Not on file  Social Needs  . Financial resource strain: Not on file  . Food insecurity:    Worry: Not on file    Inability: Not on file  . Transportation needs:    Medical: Not on file    Non-medical: Not on file  Tobacco Use  . Smoking status: Never Smoker  . Smokeless tobacco: Never Used  Substance and Sexual Activity  . Alcohol use: Yes    Alcohol/week: 1.0 standard drinks    Types: 1  Shots of liquor per week    Comment: Wine occasionally   . Drug use: No  . Sexual activity: Not Currently  Lifestyle  . Physical activity:    Days per week: Not on file    Minutes per session: Not on file  . Stress: Not on file  Relationships  . Social connections:    Talks on phone: Not on file    Gets together: Not on file    Attends religious service: Not on file    Active member of club or organization: Not on file    Attends meetings of clubs or organizations: Not on file    Relationship status: Not on file  Other Topics Concern  . Not  on file  Social History Narrative  . Not on file     Observations/Objective: Awake, alert and oriented x 3   Review of Systems  Constitutional: Negative for fever, malaise/fatigue and weight loss.  HENT: Negative.  Negative for nosebleeds.   Eyes: Negative for blurred vision, double vision and photophobia.  Respiratory: Negative.  Negative for cough and shortness of breath.   Cardiovascular: Negative.  Negative for chest pain, palpitations and leg swelling.  Gastrointestinal: Negative.  Negative for heartburn, nausea and vomiting.  Musculoskeletal: Negative.  Negative for myalgias.  Neurological: Positive for sensory change and headaches (chronic ). Negative for dizziness, focal weakness and seizures.  Psychiatric/Behavioral: Negative.  Negative for suicidal ideas.    Assessment and Plan: [place] was evaluated today for hospitalization follow-up.  Diagnoses and all orders for this visit:  Essential hypertension -     lisinopril (ZESTRIL) 40 MG tablet; Take 1 tablet (40 mg total) by mouth every morning. -     amLODipine (NORVASC) 10 MG tablet; Take 1 tablet (10 mg total) by mouth daily. -     carvedilol (COREG) 3.125 MG tablet; Take 1 tablet (3.125 mg total) by mouth 2 (two) times daily with a meal. -     Basic metabolic panel; Future Continue all antihypertensives as prescribed.  Remember to bring in your blood pressure log with you for your follow up appointment.  DASH/Mediterranean Diets are healthier choices for HTN.    Bell's palsy Symptoms resolving.       Follow Up Instructions Return in about 3 weeks (around 06/23/2019) for BP recheck.     I discussed the assessment and treatment plan with the patient. The patient was provided an opportunity to ask questions and all were answered. The patient agreed with the plan and demonstrated an understanding of the instructions.   The patient was advised to call back or seek an in-person evaluation if the symptoms worsen or if  the condition fails to improve as anticipated.  I provided 24 minutes of non-face-to-face time during this encounter including median intraservice time, reviewing previous notes, labs, imaging, medications and explaining diagnosis and management.  Gildardo Pounds, FNP-BC

## 2019-06-03 ENCOUNTER — Encounter: Payer: Self-pay | Admitting: Nurse Practitioner

## 2019-06-03 MED ORDER — LISINOPRIL 40 MG PO TABS: 40.0000 mg | ORAL_TABLET | ORAL | 0 refills | Status: DC

## 2019-06-03 MED ORDER — CARVEDILOL 3.125 MG PO TABS: 3.1250 mg | ORAL_TABLET | Freq: Two times a day (BID) | ORAL | 3 refills | Status: DC

## 2019-06-03 MED ORDER — METOPROLOL SUCCINATE ER 50 MG PO TB24: 50.0000 mg | ORAL_TABLET | Freq: Every evening | ORAL | 3 refills | Status: DC

## 2019-06-03 MED ORDER — AMLODIPINE BESYLATE 10 MG PO TABS: 10.0000 mg | ORAL_TABLET | Freq: Every day | ORAL | 3 refills | Status: DC

## 2019-06-23 ENCOUNTER — Ambulatory Visit: Payer: Medicaid Other | Admitting: Pharmacist

## 2019-10-08 ENCOUNTER — Other Ambulatory Visit: Payer: Self-pay | Admitting: Nurse Practitioner

## 2019-10-08 DIAGNOSIS — I10 Essential (primary) hypertension: Secondary | ICD-10-CM

## 2019-10-09 ENCOUNTER — Encounter: Payer: Self-pay | Admitting: Nurse Practitioner

## 2019-10-09 ENCOUNTER — Ambulatory Visit: Payer: Self-pay | Attending: Nurse Practitioner | Admitting: Nurse Practitioner

## 2019-10-09 ENCOUNTER — Other Ambulatory Visit: Payer: Self-pay

## 2019-10-09 DIAGNOSIS — E790 Hyperuricemia without signs of inflammatory arthritis and tophaceous disease: Secondary | ICD-10-CM

## 2019-10-09 DIAGNOSIS — E538 Deficiency of other specified B group vitamins: Secondary | ICD-10-CM

## 2019-10-09 DIAGNOSIS — M659 Synovitis and tenosynovitis, unspecified: Secondary | ICD-10-CM

## 2019-10-09 DIAGNOSIS — Z1211 Encounter for screening for malignant neoplasm of colon: Secondary | ICD-10-CM

## 2019-10-09 DIAGNOSIS — I10 Essential (primary) hypertension: Secondary | ICD-10-CM

## 2019-10-09 MED ORDER — LISINOPRIL 40 MG PO TABS: 40.0000 mg | ORAL_TABLET | Freq: Every day | ORAL | 0 refills | Status: DC

## 2019-10-09 MED ORDER — CARVEDILOL 3.125 MG PO TABS: 3.1250 mg | ORAL_TABLET | Freq: Two times a day (BID) | ORAL | 3 refills | Status: DC

## 2019-10-09 MED ORDER — PREDNISONE 20 MG PO TABS: 40.0000 mg | ORAL_TABLET | Freq: Every day | ORAL | 0 refills | Status: AC

## 2019-10-09 MED ORDER — AMLODIPINE BESYLATE 10 MG PO TABS: 10.0000 mg | ORAL_TABLET | Freq: Every day | ORAL | 0 refills | Status: DC

## 2019-10-09 NOTE — Progress Notes (Signed)
Virtual Visit via Telephone Note Due to national recommendations of social distancing due to [place] 19, telehealth visit is felt to be most appropriate for this patient at this time.  I discussed the limitations, risks, security and privacy concerns of performing an evaluation and management service by telephone and the availability of in person appointments. I also discussed with the patient that there may be a patient responsible charge related to this service. The patient expressed understanding and agreed to proceed.    I connected with Grace Young on 10/09/19  at   8:50 AM EDT  EDT by telephone and verified that I am speaking with the correct person using two identifiers.   Consent I discussed the limitations, risks, security and privacy concerns of performing an evaluation and management service by telephone and the availability of in person appointments. I also discussed with the patient that there may be a patient responsible charge related to this service. The patient expressed understanding and agreed to proceed.   Location of Patient: Private Residence   Location of Provider: [place] [facility] [place] participating in Telemedicine visit: Geryl Rankins FNP-BC [place]    History of Present Illness: Telemedicine visit for: Essential Hypertension   Blood pressure has not been controlled. She does monitor her blood pressure at home but infrequently. Reports medication compliance taking amlodipine 10 mg daily, carvedilol 3.125 mg BID and lisinopril 40 mg daily. Denies chest pain, shortness of breath, palpitations, lightheadedness, dizziness, headaches or BLE edema. She is not diet or exercise compliant. Blood pressure reading today with her BP cuff is 138/98. HR 115. May need to increase carvedilol. Will have her return for BP/HR check in 2 weeks.  BP Readings from Last 3 Encounters:  05/10/19 (!) 167/86  10/18/18 (!) 176/96  07/26/18  (!) 165/104   Joint  Pain: Patient complains of arthralgias for which has been present for several months. Pain is located in both ankle(s) and feet, is described as numbing, sharp and stabbing, and is constant .  Associated symptoms include: difficulty ambulating.  The patient has has had no relief from acetominophen, ibuprofen.  Related to injury:   no.  Past Medical History:  Diagnosis Date  . Bell's palsy   . Hypertension     Past Surgical History:  Procedure Laterality Date  . cyst removal from uterus and ovaries    . TONSILLECTOMY      Family History  Problem Relation Age of Onset  . Hypertension Sister   . Breast cancer Cousin   . Diabetes Mellitus II Neg Hx   . Diabetes Neg Hx     Social History   Socioeconomic History  . Marital status: Single    Spouse name: Not on file  . Number of children: Not on file  . Years of education: Not on file  . Highest education level: Not on file  Occupational History  . Not on file  Social Needs  . Financial resource strain: Not on file  . Food insecurity    Worry: Not on file    Inability: Not on file  . Transportation needs    Medical: Not on file    Non-medical: Not on file  Tobacco Use  . Smoking status: Never Smoker  . Smokeless tobacco: Never Used  Substance and Sexual Activity  . Alcohol use: Yes    Alcohol/week: 1.0 standard drinks    Types: 1 Shots of liquor per week  Comment: Wine occasionally   . Drug use: No  . Sexual activity: Not Currently  Lifestyle  . Physical activity    Days per week: Not on file    Minutes per session: Not on file  . Stress: Not on file  Relationships  . Social Herbalist on phone: Not on file    Gets together: Not on file    Attends religious service: Not on file    Active member of club or organization: Not on file    Attends meetings of clubs or organizations: Not on file    Relationship status: Not on file  Other Topics Concern  . Not on file  Social History  Narrative  . Not on file     Observations/Objective: Awake, alert and oriented x 3   Review of Systems  Constitutional: Negative for fever, malaise/fatigue and weight loss.  HENT: Negative.  Negative for nosebleeds.   Eyes: Negative.  Negative for blurred vision, double vision and photophobia.  Respiratory: Negative.  Negative for cough and shortness of breath.   Cardiovascular: Negative.  Negative for chest pain, palpitations and leg swelling.  Gastrointestinal: Negative.  Negative for heartburn, nausea and vomiting.  Musculoskeletal: Positive for joint pain. Negative for myalgias.  Neurological: Negative.  Negative for dizziness, focal weakness, seizures and headaches.  Psychiatric/Behavioral: Negative.  Negative for suicidal ideas.    Assessment and Plan:   Diagnoses and all orders for this visit:  Essential hypertension -     lisinopril (ZESTRIL) 40 MG tablet; Take 1 tablet (40 mg total) by mouth daily. -     carvedilol (COREG) 3.125 MG tablet; Take 1 tablet (3.125 mg total) by mouth 2 (two) times daily with a meal. -     amLODipine (NORVASC) 10 MG tablet; Take 1 tablet (10 mg total) by mouth daily. -     CMP14+EGFR; Future  Tenosynovitis of ankle -     predniSONE (DELTASONE) 20 MG tablet; Take 2 tablets (40 mg total) by mouth daily with breakfast for 7 days. -     VITAMIN D 25 Hydroxy (Vit-D Deficiency, Fractures); Future -     Cancel: Rheumatoid Arthritis Profile -     Rheumatoid Arthritis Profile; Future  B12 deficiency -     Cancel: Vitamin B12; Future -     Cancel: CBC -     CBC; Future -     Vitamin B12; Future  Elevated uric acid in blood -     predniSONE (DELTASONE) 20 MG tablet; Take 2 tablets (40 mg total) by mouth daily with breakfast for 7 days. -     Cancel: Uric Acid -     Uric Acid; Future     Follow Up Instructions Return for labs and BP check with LUKE, pap smear with me 4-6 weeks.     I discussed the assessment and treatment plan with the  patient. The patient was provided an opportunity to ask questions and all were answered. The patient agreed with the plan and demonstrated an understanding of the instructions.   The patient was advised to call back or seek an in-person evaluation if the symptoms worsen or if the condition fails to improve as anticipated.  I provided 24 minutes of non-face-to-face time during this encounter including median intraservice time, reviewing previous notes, labs, imaging, medications and explaining diagnosis and management.  Gildardo Pounds, FNP-BC

## 2019-10-23 ENCOUNTER — Other Ambulatory Visit: Payer: Medicaid Other

## 2019-10-23 ENCOUNTER — Ambulatory Visit: Payer: Medicaid Other | Admitting: Pharmacist

## 2019-11-23 ENCOUNTER — Ambulatory Visit: Payer: Medicaid Other | Admitting: Nurse Practitioner

## 2020-02-08 ENCOUNTER — Other Ambulatory Visit: Payer: Self-pay | Admitting: Nurse Practitioner

## 2020-02-08 DIAGNOSIS — E790 Hyperuricemia without signs of inflammatory arthritis and tophaceous disease: Secondary | ICD-10-CM

## 2020-02-08 DIAGNOSIS — M659 Synovitis and tenosynovitis, unspecified: Secondary | ICD-10-CM

## 2020-06-10 ENCOUNTER — Other Ambulatory Visit: Payer: Self-pay | Admitting: Nurse Practitioner

## 2020-06-10 DIAGNOSIS — I10 Essential (primary) hypertension: Secondary | ICD-10-CM

## 2020-06-11 ENCOUNTER — Other Ambulatory Visit: Payer: Self-pay | Admitting: Nurse Practitioner

## 2020-06-11 DIAGNOSIS — I10 Essential (primary) hypertension: Secondary | ICD-10-CM

## 2021-01-15 IMAGING — MR MRI HEAD WITHOUT CONTRAST
10 series · 41 of 48 positions shown · non-contrast
Comparison: Head CT 05/08/2019

CLINICAL DATA: Hypertension and right-sided facial droop. Slurred
speech.

EXAM:
MRI HEAD WITHOUT CONTRAST
TECHNIQUE: Multiplanar, multiecho pulse sequences of the brain and surrounding
structures were obtained without intravenous contrast.

[Series 3: T1 · sagittal · 5.0mm · 0.47mm/px · 2 of 19 slices shown]
[im 1/19]
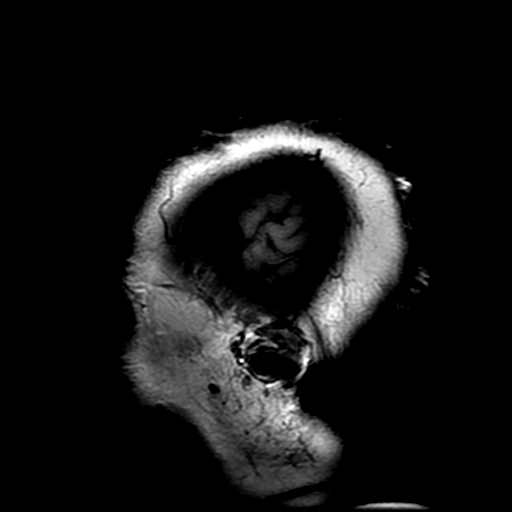
[im 19/19]
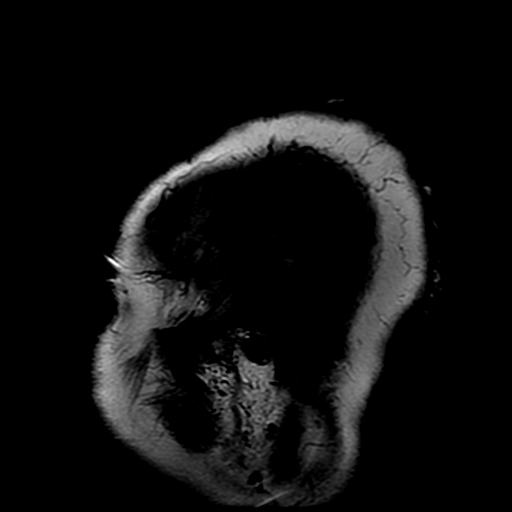

[Series 4: DWI · axial · 3.0mm · 1.09mm/px · z∈[+7,+147]mm · 9 of 96 slices shown (1 of 4)]
[im 1/96]
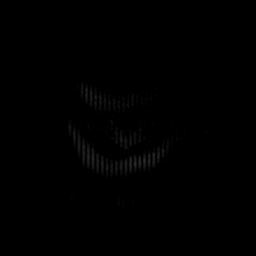
[im 12/96]
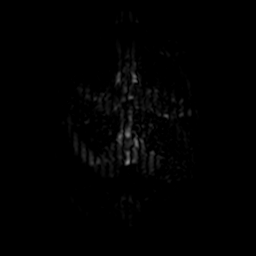
[im 24/96]
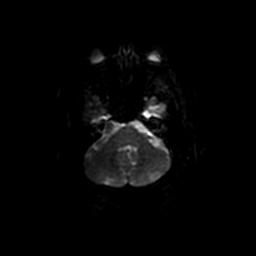
[im 36/96]
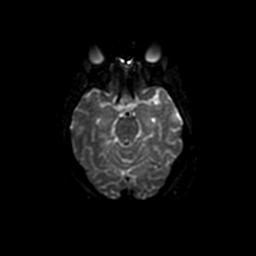
[im 48/96]
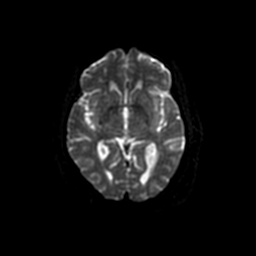
[im 60/96]
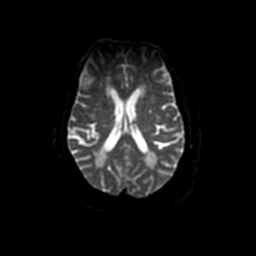
[im 72/96]
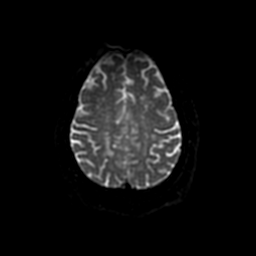
[im 84/96]
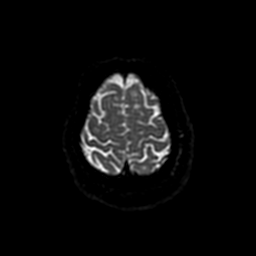
[im 96/96]
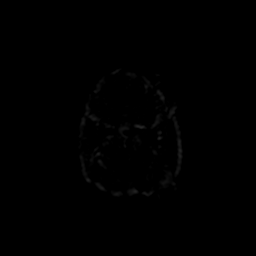

[Series 5: T2 · axial · 5.0mm · 0.90mm/px · z∈[+11,+149]mm · 2 of 24 slices shown (1 of 2)]
[im 1/24]
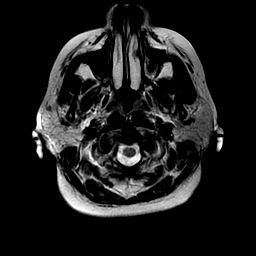
[im 24/24]
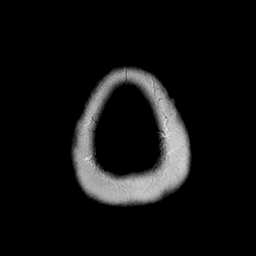

[Series 6: FLAIR · axial · 3.0mm · 0.90mm/px · z∈[+11,+149]mm · 2 of 24 slices shown]
[im 1/24]
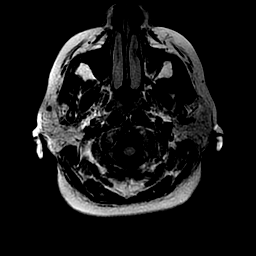
[im 24/24]
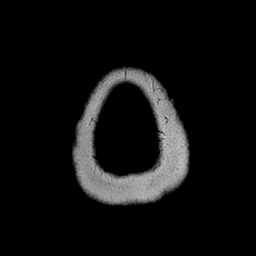

[Series 7: ax mpgr · axial · 3.0mm · 0.45mm/px · z∈[+10,+150]mm · 3 of 29 slices shown]
[im 1/29]
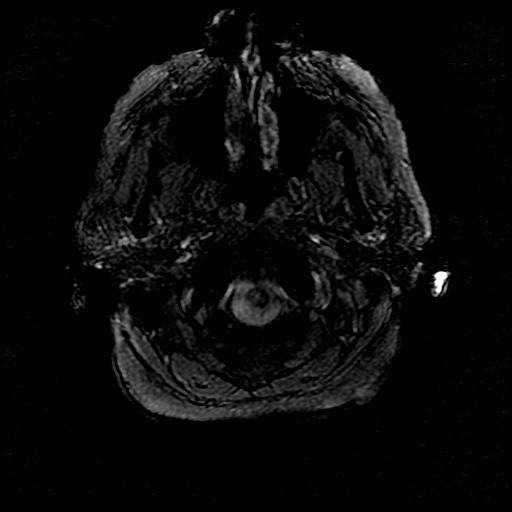
[im 15/29]
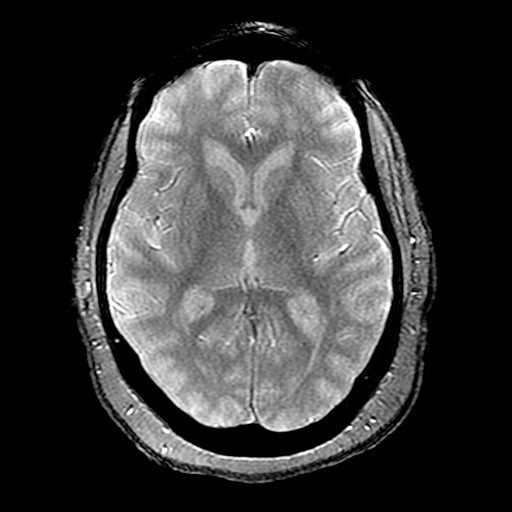
[im 29/29]
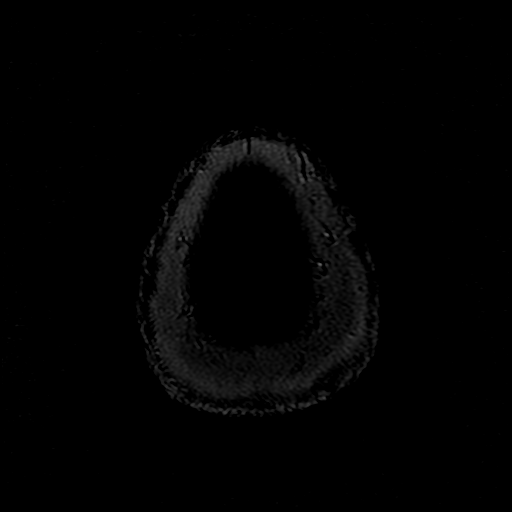

[Series 8: ax fspgr irp · axial · 1.0mm · 0.47mm/px · z∈[+14,+85]mm · 5 of 132 slices shown]
[im 1/132]
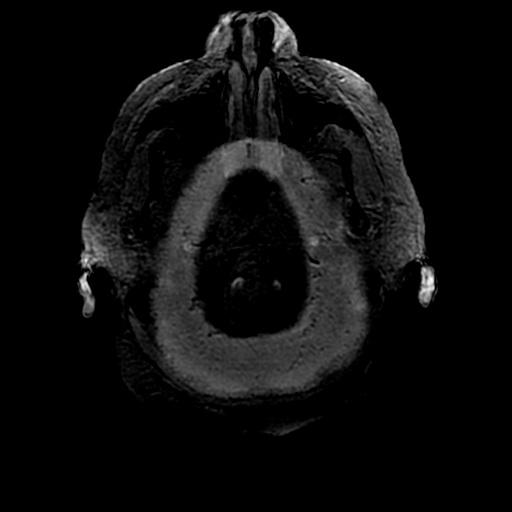
[im 24/132]
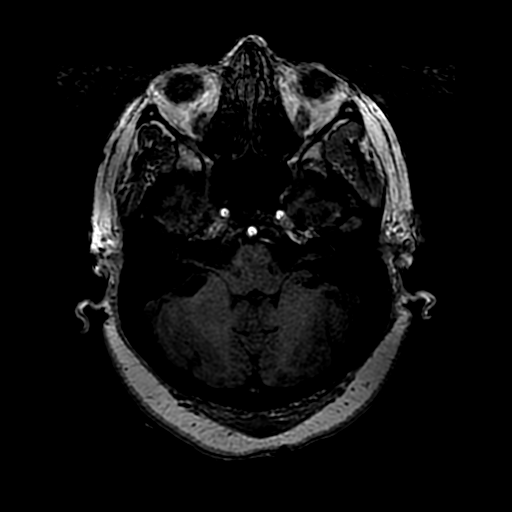
[im 36/132]
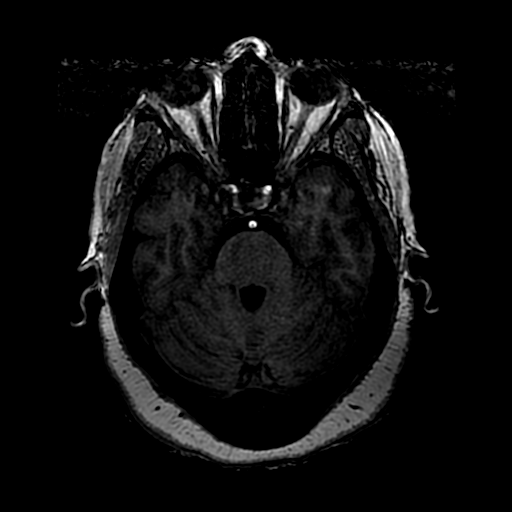
[im 60/132]
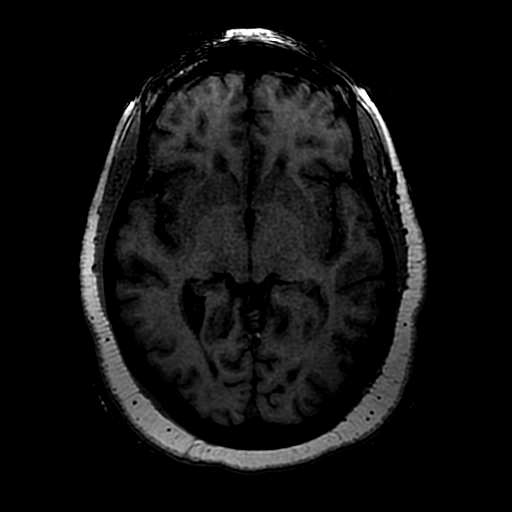
[im 72/132]
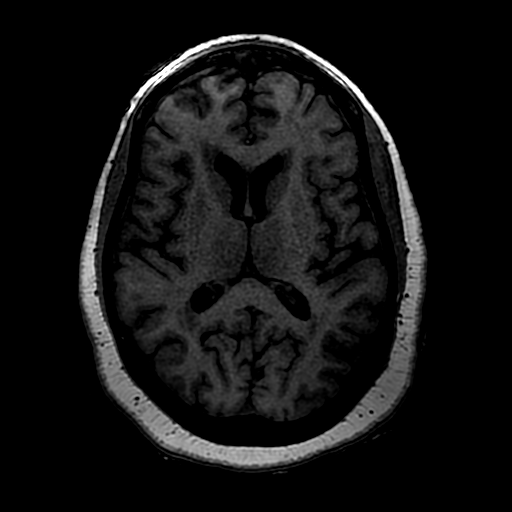

[Series 9: DWI · coronal · 4.0mm · 1.09mm/px · 8 of 82 slices shown (2 of 4)]
[im 1/82]
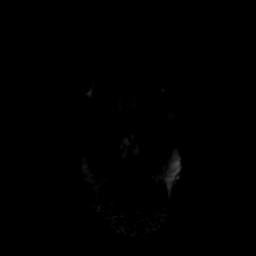
[im 12/82]
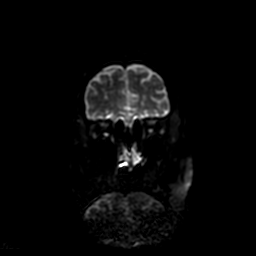
[im 24/82]
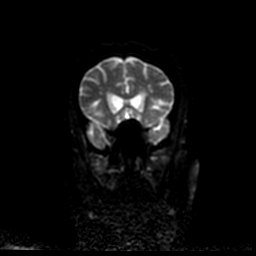
[im 35/82]
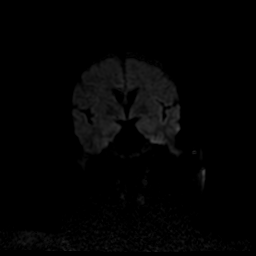
[im 47/82]
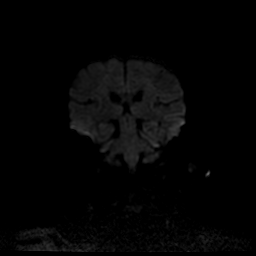
[im 58/82]
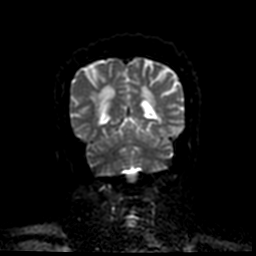
[im 70/82]
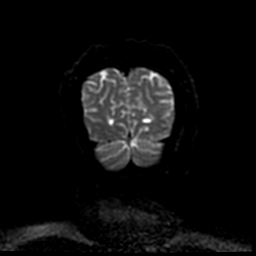
[im 82/82]
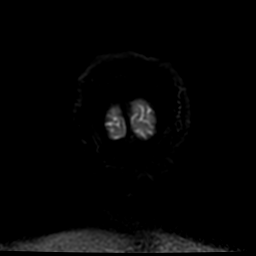

[Series 10: T2 · coronal · 5.0mm · 0.90mm/px · 2 of 26 slices shown (2 of 2)]
[im 1/26]
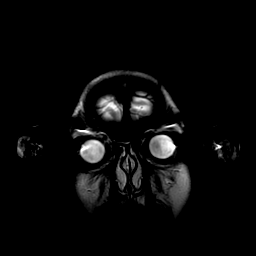
[im 26/26]
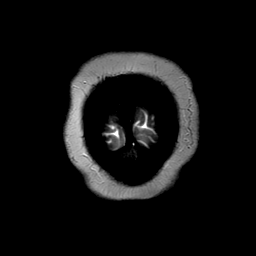

[Series 400: DWI · axial · 3.0mm · 1.09mm/px · z∈[+7,+147]mm · 4 of 48 slices shown (3 of 4)]
[im 1/48]
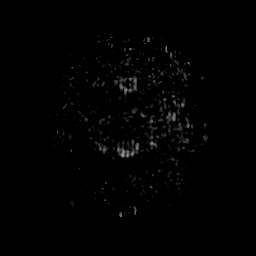
[im 16/48]
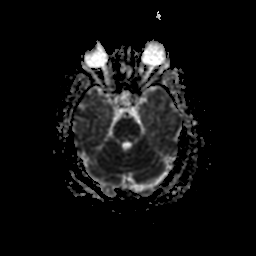
[im 32/48]
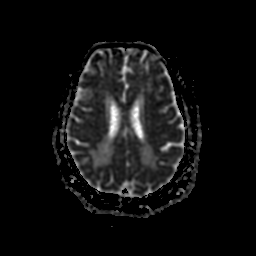
[im 48/48]
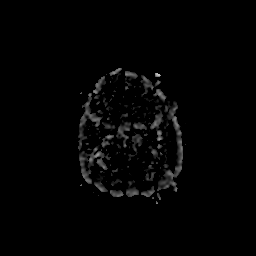

[Series 900: DWI · coronal · 4.0mm · 1.09mm/px · 4 of 41 slices shown (4 of 4)]
[im 1/41]
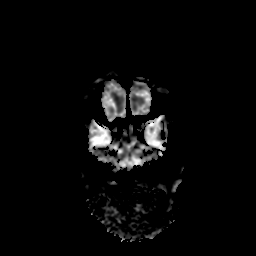
[im 14/41]
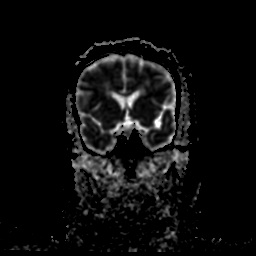
[im 27/41]
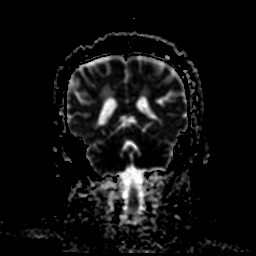
[im 41/41]
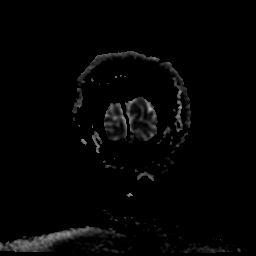

[41 of 48 positions shown; findings below may reference images not displayed]

FINDINGS: BRAIN: There is no acute infarct, acute hemorrhage or extra-axial
collection. The midline structures are normal. No midline shift or
other mass effect. Early confluent hyperintense T2-weighted signal
of the periventricular and deep white matter, most commonly due to
chronic ischemic microangiopathy. Generalized atrophy without lobar
predilection. Single focus of chronic microhemorrhage in the right
basal ganglia. No mass lesion.

VASCULAR: The major intracranial arterial and venous sinus flow
voids are normal.

SKULL AND UPPER CERVICAL SPINE: Calvarial bone marrow signal is
normal. There is no skull base mass. Visualized upper cervical spine
and soft tissues are normal.

SINUSES/ORBITS: No fluid levels or advanced mucosal thickening. No
mastoid or middle ear effusion. The orbits are normal.
IMPRESSION: Chronic ischemic microangiopathy without acute intracranial
abnormality.

## 2022-02-25 ENCOUNTER — Emergency Department (HOSPITAL_COMMUNITY)
Admission: EM | Admit: 2022-02-25 | Discharge: 2022-02-25 | Disposition: A | Discharge status: Home / Self Care | Payer: Medicaid Other | Attending: Emergency Medicine | Admitting: Emergency Medicine

## 2022-02-25 ENCOUNTER — Emergency Department (HOSPITAL_COMMUNITY): Payer: Medicaid Other | Attending: Emergency Medicine

## 2022-02-25 ENCOUNTER — Other Ambulatory Visit: Payer: Self-pay

## 2022-02-25 ENCOUNTER — Encounter (HOSPITAL_COMMUNITY): Payer: Self-pay

## 2022-02-25 DIAGNOSIS — M7989 Other specified soft tissue disorders: Secondary | ICD-10-CM

## 2022-02-25 DIAGNOSIS — I10 Essential (primary) hypertension: Secondary | ICD-10-CM | POA: Insufficient documentation

## 2022-02-25 DIAGNOSIS — R221 Localized swelling, mass and lump, neck: Secondary | ICD-10-CM | POA: Insufficient documentation

## 2022-02-25 DIAGNOSIS — R531 Weakness: Secondary | ICD-10-CM | POA: Insufficient documentation

## 2022-02-25 DIAGNOSIS — R52 Pain, unspecified: Secondary | ICD-10-CM

## 2022-02-25 DIAGNOSIS — Z79899 Other long term (current) drug therapy: Secondary | ICD-10-CM | POA: Insufficient documentation

## 2022-02-25 MED ORDER — CARVEDILOL 3.125 MG PO TABS: 3.1250 mg | ORAL_TABLET | Freq: Two times a day (BID) | ORAL | Status: DC

## 2022-02-25 MED ORDER — AMLODIPINE BESYLATE 5 MG PO TABS
10.0000 mg | ORAL_TABLET | Freq: Every day | ORAL | Status: DC
  Administered 2022-02-25: 10 mg via ORAL
  Filled 2022-02-25: qty 2

## 2022-02-25 MED ORDER — AMLODIPINE BESYLATE 10 MG PO TABS: 10.0000 mg | ORAL_TABLET | Freq: Every day | ORAL | 0 refills | Status: DC

## 2022-02-25 MED ORDER — LISINOPRIL 10 MG PO TABS
40.0000 mg | ORAL_TABLET | Freq: Every day | ORAL | Status: DC
  Administered 2022-02-25: 40 mg via ORAL
  Filled 2022-02-25: qty 4

## 2022-02-25 MED ORDER — LISINOPRIL 40 MG PO TABS: 40.0000 mg | ORAL_TABLET | Freq: Every day | ORAL | 0 refills | Status: DC

## 2022-02-25 MED ORDER — CARVEDILOL 3.125 MG PO TABS: 3.1250 mg | ORAL_TABLET | Freq: Two times a day (BID) | ORAL | 0 refills | Status: DC

## 2022-02-25 NOTE — ED Triage Notes (Signed)
Pt BIB [facility] from home c/o neck and left knee pain. Pt states pain has been going on for 4 months now. Pt has taken Tylenol and ibuprofen without relief. Is now concerned that there is a mass on left side of the neck. States that she has had some difficulty walking.  ?

## 2022-02-25 NOTE — ED Provider Notes (Signed)
?[facility]-EMERGENCY DEPT ?Provider Note ? ? ?CSN: [id number] ?Arrival date & time: 02/25/22  1745 ? ?  ? ?History ? ?CC: Neck mass, left leg swelling, hypertension ? ? ?Grace Young is a 56 y.o. female present emerged from with several medical complaints.  She reports primarily that she is concerned that her left knee has been intermittently swollen for the past several months, since about August, she is also had difficulty lifting her left leg for several months.  She denies any trauma, any back pain, any radiculopathy.  She says a different times a day her knee will feel more swollen.  She denies history of DVT or PE.  She is also concerned about a soft mass at the base of her left neck, above the clavicle, which has been ongoing for several days. ? ?She reports that she knows that she has very high blood pressure, has been told many times in the past.  She unfortunately ran out of her medications several months ago, had been prescribed amlodipine, lisinopril, and Coreg.  She says that her PCP was not willing to represcribe these medications without an office evaluation. ? ?HPI ? ?  ? ?Home Medications ?Prior to Admission medications   ?Medication Sig Start Date End Date Taking? Authorizing Provider  ?amLODipine (NORVASC) 10 MG tablet Take 1 tablet (10 mg total) by mouth daily. 02/25/22 05/26/22  Terald Sleeper, MD  ?carvedilol (COREG) 3.125 MG tablet Take 1 tablet (3.125 mg total) by mouth 2 (two) times daily with a meal. 02/25/22 05/26/22  Trannie Bardales, Kermit Balo, MD  ?diphenhydrAMINE (BENADRYL) 25 MG tablet Take 25 mg by mouth every 6 (six) hours as needed for allergies.    [provider]  ?lisinopril (ZESTRIL) 40 MG tablet Take 1 tablet (40 mg total) by mouth daily. 02/25/22 05/26/22  Terald Sleeper, MD  ?   ? ?Allergies    ?Patient has no known allergies.   ? ?Review of Systems   ?Review of Systems ? ?Physical Exam ?Updated Vital Signs ?BP (!) 195/134   Pulse 86   Resp 19   Ht 5\' 8"   (1.727 m)   Wt 114 kg   SpO2 98%   BMI 38.21 kg/m?  ?Physical Exam ?Constitutional:   ?   General: She is not in acute distress. ?HENT:  ?   Head: Normocephalic and atraumatic.  ?   Comments: Soft cyst palpable along left clavicle ?Eyes:  ?   Conjunctiva/sclera: Conjunctivae normal.  ?   Pupils: Pupils are equal, round, and reactive to light.  ?Cardiovascular:  ?   Rate and Rhythm: Normal rate and regular rhythm.  ?Pulmonary:  ?   Effort: Pulmonary effort is normal. No respiratory distress.  ?Abdominal:  ?   General: There is no distension.  ?   Tenderness: There is no abdominal tenderness.  ?Musculoskeletal:  ?   Comments: Mild effusion left knee, no erythema of the leg ?Patient able to passively ROM equally at the hips and ankles, some weakness with left knee extension  ?Skin: ?   General: Skin is warm and dry.  ?Neurological:  ?   General: No focal deficit present.  ?   Mental Status: She is alert. Mental status is at baseline.  ?   Sensory: No sensory deficit.  ?Psychiatric:     ?   Mood and Affect: Mood normal.     ?   Behavior: Behavior normal.  ? ? ?ED Results / Procedures / Treatments   ?Labs ?(  all labs ordered are listed, but only abnormal results are displayed) ?Labs Reviewed - No data to display ? ?EKG ?None ? ?Radiology ?No results found. ? ?Procedures ?Procedures  ? ? ?Medications Ordered in ED ?Medications  ?amLODipine (NORVASC) tablet 10 mg (10 mg Oral Given 02/25/22 1842)  ?carvedilol (COREG) tablet 3.125 mg (has no administration in time range)  ?lisinopril (ZESTRIL) tablet 40 mg (40 mg Oral Given 02/25/22 1842)  ? ? ?ED Course/ Medical Decision Making/ A&P ?  ?                        ?Medical Decision Making ?Amount and/or Complexity of Data Reviewed ?ECG/medicine tests: ordered. ? ?Risk ?Prescription drug management. ? ? ?Patient is presenting to the emergency department with several complaints. ? ?Neck mass -this is soft, rounded palpable, consistent with a cyst, which is suspected benign.  I have  a lower suspicion for solid mass malignancy, or abscess.  Do not believe she needs emergent imaging of her neck at this time ?Left knee swelling -patient reports that she had outpatient x-rays done by a [employer] physician, and was told that she has no fracture in the knee.  She does report she has a sensation of "knee buckling" at different times.  She may have experienced a ligament injury in the past.  Also in the differential would be a DVT.  Unfortunately we are not able to obtain a DVT ultrasound at this time.  I therefore ordered her an image for tomorrow, explained to her the follow-up plan in the morning to present to [facility] for DVT ultrasound.  She verbalized understanding that she would do so.  I also recommended that she consider a flexible over-the-counter knee brace for extra stability, and that if her ultrasound is negative that she consider follow-up with an orthopedic doctor, who may consider additional imaging or testing. ?Left leg weakness -she demonstrates normal strength on isolated muscle testing, no signs of cord compression or saddle anesthesia, no back pain.  She specifically appears to have weakness with left lower leg or knee extension, which may be related to a ligament injury, may alternatively be related to an L3 compression, although this would seem less likely with no back pain or radicular symptoms. ?Hypertension -related to medical noncompliance.  I was able to give the patient her medications again in the ER and represcribed these to her pharmacy.  I explained her she needs to follow-up with her PCP for this, I talked about the dangers of untreated hypertension, including stroke, heart attack, kidney failure disease.  I do not see signs at this time of hypertensive emergency to warrant emergent imaging of the brain, cardiac testing or kidney testing. ? ? ? ? ? ? ? ?Final Clinical Impression(s) / ED Diagnoses ?Final diagnoses:  ?Left leg swelling  ?Neck mass   ?Hypertension, unspecified type  ? ? ?Rx / DC Orders ?ED Discharge Orders   ? ?      Ordered  ?  UE VENOUS DUPLEX  Status:  Canceled       ? 02/25/22 1835  ?  LE VENOUS  Status:  Canceled       ? 02/25/22 1849  ?  lisinopril (ZESTRIL) 40 MG tablet  Daily       ?Note to Pharmacy: Must have office visit for refills  ? 02/25/22 1856  ?  carvedilol (COREG) 3.125 MG tablet  2 times daily with meals       ?  02/25/22 1856  ?  amLODipine (NORVASC) 10 MG tablet  Daily       ? 02/25/22 1856  ?  VAS [place] LOWER EXTREMITY VENOUS (DVT)  Status:  Canceled       ? 02/25/22 1849  ? ?  ?  ? ?  ? ? ?  ?Terald Sleeper, MD ?02/25/22 1911 ? ?

## 2022-02-25 NOTE — Discharge Instructions (Addendum)
Please follow the instructions circled on your discharge papers to come in for your ultrasound of your leg tomorrow.  This would be to look for signs of blood clots in the leg.  If you do have a blood clot you will need started on treatment for that.  If you do not have a blood clot, I would then recommend following up with an orthopedic doctor for your leg pain and hip pain. ? ?Your blood pressure is also extremely high.  It is very important that you keep on your blood pressure medication.  I sent a refill for these medicines to your [place].  The refill covers 90 days of medicine.  You should schedule another visit with your primary provider in the mean time, who should help you monitor and control your medical conditions.  ? ?Untreated high blood pressure can lead to stroke, heart disease, heart attack, and kidney failure. ?

## 2022-02-26 ENCOUNTER — Ambulatory Visit (HOSPITAL_COMMUNITY): Admission: RE | Admit: 2022-02-26 | Payer: Medicaid Other | Source: Ambulatory Visit

## 2022-02-26 ENCOUNTER — Other Ambulatory Visit (HOSPITAL_COMMUNITY): Payer: Self-pay | Admitting: Vascular Surgery

## 2023-05-19 ENCOUNTER — Other Ambulatory Visit: Payer: Self-pay

## 2023-05-19 ENCOUNTER — Encounter (HOSPITAL_COMMUNITY): Payer: Self-pay

## 2023-05-19 ENCOUNTER — Emergency Department (HOSPITAL_COMMUNITY)
Admission: EM | Admit: 2023-05-19 | Discharge: 2023-05-19 | Disposition: A | Discharge status: Home / Self Care | Payer: Medicaid Other | Attending: Emergency Medicine | Admitting: Emergency Medicine

## 2023-05-19 ENCOUNTER — Emergency Department (HOSPITAL_COMMUNITY): Payer: Medicaid Other

## 2023-05-19 DIAGNOSIS — G8929 Other chronic pain: Secondary | ICD-10-CM | POA: Diagnosis not present

## 2023-05-19 DIAGNOSIS — M25562 Pain in left knee: Secondary | ICD-10-CM | POA: Diagnosis present

## 2023-05-19 DIAGNOSIS — I10 Essential (primary) hypertension: Secondary | ICD-10-CM | POA: Insufficient documentation

## 2023-05-19 DIAGNOSIS — Z79899 Other long term (current) drug therapy: Secondary | ICD-10-CM | POA: Diagnosis not present

## 2023-05-19 DIAGNOSIS — R000 Tachycardia, unspecified: Secondary | ICD-10-CM | POA: Diagnosis not present

## 2023-05-19 MED ORDER — AMLODIPINE BESYLATE 10 MG PO TABS: 10.0000 mg | ORAL_TABLET | Freq: Every day | ORAL | 0 refills | Status: DC

## 2023-05-19 MED ORDER — MELOXICAM 7.5 MG PO TABS: 7.5000 mg | ORAL_TABLET | Freq: Every day | ORAL | 1 refills | Status: DC

## 2023-05-19 MED ORDER — CARVEDILOL 3.125 MG PO TABS: 3.1250 mg | ORAL_TABLET | Freq: Two times a day (BID) | ORAL | 0 refills | Status: DC

## 2023-05-19 NOTE — Discharge Instructions (Addendum)
Please use Tylenol or ibuprofen for pain.  You may use 600 mg ibuprofen every 6 hours or 1000 mg of Tylenol every 6 hours.  You may choose to alternate between the 2.  This would be most effective.  Not to exceed 4 g of Tylenol within 24 hours.  Not to exceed 3200 mg ibuprofen 24 hours.  Follow-up with the cardiologist, and schedule an appointment with the orthopedic physician whose contact information I provided.

## 2023-05-19 NOTE — ED Provider Notes (Signed)
[name] EMERGENCY DEPARTMENT AT [facility] Provider Note   CSN: [id number] Arrival date & time: 05/19/23  1131     History  Chief Complaint  Patient presents with   Knee Pain    Grace Young is a 57 y.o. female with past medical history significant for hypertensive urgency, poorly controlled hypertension, intracerebral hemorrhage, chronic knee pain who presents with concern for acute on chronic left knee pain.  Patient reports that she is being seen at [facility] which is a nonsurgical orthopedic knee group, she has not done any bracing, but has been taking ibuprofen, Tylenol, has done Toradol and other intra-articular injections.  Patient reports initially with some relief, but not having any relief at this time.  She denies any new numbness, tingling, fall.  She thinks that it is all because of a fall that she had 7 years ago.   Knee Pain      Home Medications Prior to Admission medications   Medication Sig Start Date End Date Taking? Authorizing Provider  meloxicam (MOBIC) 7.5 MG tablet Take 1 tablet (7.5 mg total) by mouth daily. 05/19/23  Yes Morissa Obeirne H, PA-C  amLODipine (NORVASC) 10 MG tablet Take 1 tablet (10 mg total) by mouth daily. 05/19/23 08/17/23  Joncarlos Atkison H, PA-C  carvedilol (COREG) 3.125 MG tablet Take 1 tablet (3.125 mg total) by mouth 2 (two) times daily with a meal. 05/19/23 08/17/23  Hien Cunliffe H, PA-C  diphenhydrAMINE (BENADRYL) 25 MG tablet Take 25 mg by mouth every 6 (six) hours as needed for allergies.    [provider]  lisinopril (ZESTRIL) 40 MG tablet Take 1 tablet (40 mg total) by mouth daily. 02/25/22 05/26/22  Terald Sleeper, MD      Allergies    Patient has no known allergies.    Review of Systems   Review of Systems  All other systems reviewed and are negative.   Physical Exam Updated Vital Signs BP (!) 186/127 (BP Location: Left Arm)   Pulse (!) 120   Temp 99 F (37.2 C) (Oral)    Resp 18   Wt 114 kg   SpO2 100%   BMI 38.21 kg/m  Physical Exam Vitals and nursing note reviewed.  Constitutional:      General: She is not in acute distress.    Appearance: Normal appearance.  HENT:     Head: Normocephalic and atraumatic.  Eyes:     General:        Right eye: No discharge.        Left eye: No discharge.  Cardiovascular:     Rate and Rhythm: Normal rate and regular rhythm.  Pulmonary:     Effort: Pulmonary effort is normal. No respiratory distress.  Musculoskeletal:        General: No deformity.     Comments: Patient with pain with passive and active flexion, extension at the left knee but normal range of motion throughout.  She has tenderness in the medial and lateral compartments, moderate joint effusion.  No overlying skin redness.  Skin:    General: Skin is warm and dry.  Neurological:     Mental Status: She is alert and oriented to person, place, and time.     Comments: Moves all 4 limbs spontaneously, CN II through XII grossly intact, can ambulate without difficulty, intact sensation throughout.   Psychiatric:        Mood and Affect: Mood normal.        Behavior:  Behavior normal.     ED Results / Procedures / Treatments   Labs (all labs ordered are listed, but only abnormal results are displayed) Labs Reviewed - No data to display  EKG None  Radiology DG Knee Complete 4 Views Left  Result Date: 05/19/2023 CLINICAL DATA:  Chronic knee pain, injured 2 years ago with persistent pain EXAM: LEFT KNEE - COMPLETE 4+ VIEW COMPARISON:  None Available. FINDINGS: No fracture or dislocation of the left knee. Moderate patellofemoral compartment arthrosis with relatively preserved femorotibial compartments. Small knee joint effusion. Soft tissues unremarkable. IMPRESSION: 1. No fracture or dislocation of the left knee. 2. Moderate patellofemoral compartment arthrosis with relatively preserved femorotibial compartments. 3. Small knee joint effusion.  Electronically Signed   By: Jearld Lesch M.D.   On: 05/19/2023 13:07    Procedures Procedures    Medications Ordered in ED Medications - No data to display  ED Course/ Medical Decision Making/ A&P                             Medical Decision Making Amount and/or Complexity of Data Reviewed Radiology: ordered.   This patient is a 57 y.o. female who presents to the ED for concern of acute on chronic left knee pain without new injury, as well as patient presents with elevated blood pressure without chest pain, vision changes, shob.   Differential diagnoses prior to evaluation: Chronic arthritis, remote fracture, dislocation, ligamentous injury, effusion, septic arthritis, hypertensive urgency, emergency versus asymptomatic poorly controlled hypertension  Past Medical History / Social History / Additional history: Chart reviewed. Pertinent results include: hypertensive urgency, poorly controlled hypertension, intracerebral hemorrhage, chronic knee pain  Physical Exam: Physical exam performed. The pertinent findings include: Musculoskeletal:        General: No deformity.     Comments: Patient with pain with passive and active flexion, extension at the left knee but normal range of motion throughout.  She has tenderness in the medial and lateral compartments, moderate joint effusion.  No overlying skin redness.  Skin:    General: Skin is warm and dry.  Neurological:     Mental Status: She is alert and oriented to person, place, and time.     Comments: Moves all 4 limbs spontaneously, CN II through XII grossly intact, can ambulate without difficulty, intact sensation throughout.   The affected left knee and left lower extremity are neurovascular intact with strong 2+ DP, PT pulses, she is less than 3-second capillary refill distal to the affected knee.  Imaging: I independently interpreted imaging including plain film of left knee which shows moderate effusion, arthritis, no evidence  of fracture, dislocation. I agree with the radiologist interpretation.  Medications / Treatment: Encouraged knee brace, meloxicam, tylenol, orthopedic follow up   Disposition: After consideration of the diagnostic results and the patients response to treatment, I feel that patient is stable for discharge with plan as above.   emergency department workup does not suggest an emergent condition requiring admission or immediate intervention beyond what has been performed at this time. The plan is: as above. The patient is safe for discharge and has been instructed to return immediately for worsening symptoms, change in symptoms or any other concerns.  Final Clinical Impression(s) / ED Diagnoses Final diagnoses:  Chronic pain of left knee  Poorly-controlled hypertension  Tachycardia    Rx / DC Orders ED Discharge Orders          Ordered  amLODipine (NORVASC) 10 MG tablet  Daily        05/19/23 1322    meloxicam (MOBIC) 7.5 MG tablet  Daily        05/19/23 1322    carvedilol (COREG) 3.125 MG tablet  2 times daily with meals        05/19/23 1322    Ambulatory referral to Cardiology        05/19/23 [address], Edyth Gunnels 05/19/23 1328    Maia Plan, MD 05/21/23 2225

## 2023-05-19 NOTE — ED Triage Notes (Signed)
C/o left knee pain.  Seen in past for same Ambulatory to triage

## 2023-07-15 ENCOUNTER — Encounter: Payer: Self-pay | Admitting: Internal Medicine

## 2023-07-15 ENCOUNTER — Ambulatory Visit: Payer: Medicaid Other | Attending: Internal Medicine | Admitting: Internal Medicine

## 2023-07-15 NOTE — Progress Notes (Deleted)
Cardiology Office Note:    Date:  07/15/2023   ID:  Grace Young, DOB 1966-11-28, MRN [id number]  PCP:  Claiborne Rigg, NP   [facility] Providers Cardiologist:  None { Click to update primary MD,subspecialty MD or APP then REFRESH:1}    Referring MD: Olene Floss, *   No chief complaint on file. HTN  History of Present Illness:    Grace Young is a 57 y.o. female with a hx of ICH, chronic knee pain, HTN, was in the ED for   Past Medical History:  Diagnosis Date   Bell's palsy    Hypertension     Past Surgical History:  Procedure Laterality Date   cyst removal from uterus and ovaries     TONSILLECTOMY      Current Medications: No outpatient medications have been marked as taking for the 07/15/23 encounter (Appointment) with Maisie Fus, MD.     Allergies:   Patient has no known allergies.   Social History   Socioeconomic History   Marital status: Single    Spouse name: Not on file   Number of children: Not on file   Years of education: Not on file   Highest education level: Not on file  Occupational History   Not on file  Tobacco Use   Smoking status: Never   Smokeless tobacco: Never  Substance and Sexual Activity   Alcohol use: Yes    Alcohol/week: 1.0 standard drink of alcohol    Types: 1 Shots of liquor per week    Comment: Wine occasionally    Drug use: No   Sexual activity: Not Currently  Other Topics Concern   Not on file  Social History Narrative   Not on file   Social Determinants of Health   Financial Resource Strain: Not on file  Food Insecurity: Not on file  Transportation Needs: Not on file  Physical Activity: Not on file  Stress: Not on file  Social Connections: Not on file     Family History: The patient's ***family history includes Breast cancer in her cousin; Hypertension in her sister. There is no history of Diabetes Mellitus II or Diabetes.  ROS:   Please see the history of present illness.    ***  All other systems reviewed and are negative.  EKGs/Labs/Other Studies Reviewed:    The following studies were reviewed today: ***      Recent Labs: No results found for requested labs within last 365 days.  Recent Lipid Panel    Component Value Date/Time   CHOL 209 (H) 05/09/2019 0228   TRIG 334 (H) 05/09/2019 0228   HDL 59 05/09/2019 0228   CHOLHDL 3.5 05/09/2019 0228   VLDL 67 (H) 05/09/2019 0228   LDLCALC 83 05/09/2019 0228     Risk Assessment/Calculations:   {Does this patient have ATRIAL FIBRILLATION?:[phone]}  No BP recorded.  {Refresh Note OR Click here to enter BP  :1}***         Physical Exam:    VS:  There were no vitals taken for this visit.    Wt Readings from Last 3 Encounters:  05/19/23 251 lb 5.2 oz (114 kg)  02/25/22 251 lb 5.2 oz (114 kg)  05/09/19 250 lb (113.4 kg)     GEN: *** Well nourished, well developed in no acute distress HEENT: Normal NECK: No JVD; No carotid bruits LYMPHATICS: No lymphadenopathy CARDIAC: ***RRR, no murmurs, rubs, gallops RESPIRATORY:  Clear to auscultation without rales, wheezing or rhonchi  ABDOMEN: Soft, non-tender, non-distended MUSCULOSKELETAL:  No edema; No deformity  SKIN: Warm and dry NEUROLOGIC:  Alert and oriented x 3 PSYCHIATRIC:  Normal affect   ASSESSMENT:    HTN - chronic pain can contribute to hypertension - no hx of OSA *** -  PLAN:    In order of problems listed above:  Continue norvasc 10 mg daily Continue coreg 3.125 mg BID      {Are you ordering a CV Procedure (e.g. stress test, cath, DCCV, TEE, etc)?   Press F2        :[id number]}    Medication Adjustments/Labs and Tests Ordered: Current medicines are reviewed at length with the patient today.  Concerns regarding medicines are outlined above.  No orders of the defined types were placed in this encounter.  No orders of the defined types were placed in this encounter.   There are no Patient Instructions on file for this visit.    Signed, Maisie Fus, MD  07/15/2023 1:46 PM    [facility]

## 2023-07-22 ENCOUNTER — Ambulatory Visit: Payer: Medicaid Other | Attending: Internal Medicine | Admitting: Internal Medicine

## 2023-07-22 NOTE — Progress Notes (Deleted)
Cardiology Office Note:    Date:  07/22/2023   ID:  Grace Young, DOB 05-Jun-1966, MRN [id number]  PCP:  Claiborne Rigg, NP   [facility] Providers Cardiologist:  None { Click to update primary MD,subspecialty MD or APP then REFRESH:1}    Referring MD: Olene Floss, *   No chief complaint on file. ***  History of Present Illness:    Grace Young is a 57 y.o. female with a hx of ICH, chronic knee pain, presents for a referral for HTN. Reported BP at her last primary provider visit was 186/127 mmHg.   Past Medical History:  Diagnosis Date   Bell's palsy    Hypertension     Past Surgical History:  Procedure Laterality Date   cyst removal from uterus and ovaries     TONSILLECTOMY      Current Medications: No outpatient medications have been marked as taking for the 07/22/23 encounter (Appointment) with Maisie Fus, MD.     Allergies:   Patient has no known allergies.   Social History   Socioeconomic History   Marital status: Single    Spouse name: Not on file   Number of children: Not on file   Years of education: Not on file   Highest education level: Not on file  Occupational History   Not on file  Tobacco Use   Smoking status: Never   Smokeless tobacco: Never  Substance and Sexual Activity   Alcohol use: Yes    Alcohol/week: 1.0 standard drink of alcohol    Types: 1 Shots of liquor per week    Comment: Wine occasionally    Drug use: No   Sexual activity: Not Currently  Other Topics Concern   Not on file  Social History Narrative   Not on file   Social Determinants of Health   Financial Resource Strain: Not on file  Food Insecurity: Not on file  Transportation Needs: Not on file  Physical Activity: Not on file  Stress: Not on file  Social Connections: Not on file     Family History: The patient's ***family history includes Breast cancer in her cousin; Hypertension in her sister. There is no history of Diabetes Mellitus II  or Diabetes.  ROS:   Please see the history of present illness.    *** All other systems reviewed and are negative.  EKGs/Labs/Other Studies Reviewed:    The following studies were reviewed today: ***      Recent Labs: No results found for requested labs within last 365 days.  Recent Lipid Panel    Component Value Date/Time   CHOL 209 (H) 05/09/2019 0228   TRIG 334 (H) 05/09/2019 0228   HDL 59 05/09/2019 0228   CHOLHDL 3.5 05/09/2019 0228   VLDL 67 (H) 05/09/2019 0228   LDLCALC 83 05/09/2019 0228     Risk Assessment/Calculations:   {Does this patient have ATRIAL FIBRILLATION?:[phone]}  No BP recorded.  {Refresh Note OR Click here to enter BP  :1}***         Physical Exam:    VS:  There were no vitals taken for this visit.    Wt Readings from Last 3 Encounters:  05/19/23 251 lb 5.2 oz (114 kg)  02/25/22 251 lb 5.2 oz (114 kg)  05/09/19 250 lb (113.4 kg)     GEN: *** Well nourished, well developed in no acute distress HEENT: Normal NECK: No JVD; No carotid bruits LYMPHATICS: No lymphadenopathy CARDIAC: ***RRR, no murmurs, rubs,  gallops RESPIRATORY:  Clear to auscultation without rales, wheezing or rhonchi  ABDOMEN: Soft, non-tender, non-distended MUSCULOSKELETAL:  No edema; No deformity  SKIN: Warm and dry NEUROLOGIC:  Alert and oriented x 3 PSYCHIATRIC:  Normal affect   ASSESSMENT:    No diagnosis found. PLAN:    In order of problems listed above:  ***      {Are you ordering a CV Procedure (e.g. stress test, cath, DCCV, TEE, etc)?   Press F2        :[id number]}    Medication Adjustments/Labs and Tests Ordered: Current medicines are reviewed at length with the patient today.  Concerns regarding medicines are outlined above.  No orders of the defined types were placed in this encounter.  No orders of the defined types were placed in this encounter.   There are no Patient Instructions on file for this visit.   Signed, Maisie Fus, MD   07/22/2023 7:42 AM    [facility]

## 2023-07-24 ENCOUNTER — Encounter (HOSPITAL_COMMUNITY): Payer: Self-pay

## 2023-07-24 ENCOUNTER — Emergency Department (HOSPITAL_COMMUNITY): Payer: Medicaid Other

## 2023-07-24 ENCOUNTER — Inpatient Hospital Stay (HOSPITAL_COMMUNITY): Payer: Medicaid Other

## 2023-07-24 ENCOUNTER — Inpatient Hospital Stay (HOSPITAL_COMMUNITY)
Admission: EM | Admit: 2023-07-24 | Discharge: 2023-07-29 | DRG: 305 | Disposition: A | Discharge status: Home / Self Care | Payer: Medicaid Other | Attending: Family Medicine | Admitting: Family Medicine

## 2023-07-24 ENCOUNTER — Other Ambulatory Visit: Payer: Self-pay

## 2023-07-24 ENCOUNTER — Ambulatory Visit: Payer: Self-pay | Admitting: *Deleted

## 2023-07-24 DIAGNOSIS — Z91148 Patient's other noncompliance with medication regimen for other reason: Secondary | ICD-10-CM | POA: Diagnosis not present

## 2023-07-24 DIAGNOSIS — Z8669 Personal history of other diseases of the nervous system and sense organs: Secondary | ICD-10-CM | POA: Diagnosis not present

## 2023-07-24 DIAGNOSIS — Z791 Long term (current) use of non-steroidal anti-inflammatories (NSAID): Secondary | ICD-10-CM | POA: Diagnosis not present

## 2023-07-24 DIAGNOSIS — Z803 Family history of malignant neoplasm of breast: Secondary | ICD-10-CM

## 2023-07-24 DIAGNOSIS — D519 Vitamin B12 deficiency anemia, unspecified: Secondary | ICD-10-CM | POA: Diagnosis present

## 2023-07-24 DIAGNOSIS — M25512 Pain in left shoulder: Secondary | ICD-10-CM | POA: Diagnosis present

## 2023-07-24 DIAGNOSIS — I10 Essential (primary) hypertension: Secondary | ICD-10-CM

## 2023-07-24 DIAGNOSIS — I169 Hypertensive crisis, unspecified: Principal | ICD-10-CM

## 2023-07-24 DIAGNOSIS — I161 Hypertensive emergency: Secondary | ICD-10-CM | POA: Diagnosis present

## 2023-07-24 DIAGNOSIS — T464X6A Underdosing of angiotensin-converting-enzyme inhibitors, initial encounter: Secondary | ICD-10-CM | POA: Diagnosis present

## 2023-07-24 DIAGNOSIS — T447X6A Underdosing of beta-adrenoreceptor antagonists, initial encounter: Secondary | ICD-10-CM | POA: Diagnosis present

## 2023-07-24 DIAGNOSIS — K760 Fatty (change of) liver, not elsewhere classified: Secondary | ICD-10-CM | POA: Diagnosis present

## 2023-07-24 DIAGNOSIS — E876 Hypokalemia: Secondary | ICD-10-CM | POA: Diagnosis present

## 2023-07-24 DIAGNOSIS — Z91138 Patient's unintentional underdosing of medication regimen for other reason: Secondary | ICD-10-CM | POA: Diagnosis not present

## 2023-07-24 DIAGNOSIS — D539 Nutritional anemia, unspecified: Secondary | ICD-10-CM | POA: Insufficient documentation

## 2023-07-24 DIAGNOSIS — Z79899 Other long term (current) drug therapy: Secondary | ICD-10-CM

## 2023-07-24 DIAGNOSIS — M549 Dorsalgia, unspecified: Secondary | ICD-10-CM | POA: Diagnosis present

## 2023-07-24 DIAGNOSIS — E538 Deficiency of other specified B group vitamins: Secondary | ICD-10-CM | POA: Insufficient documentation

## 2023-07-24 LAB — BASIC METABOLIC PANEL
Anion gap: 13 (ref 5–15)
BUN: 9 mg/dL (ref 6–20)
CO2: 22 mmol/L (ref 22–32)
Calcium: 9.1 mg/dL (ref 8.9–10.3)
Chloride: 101 mmol/L (ref 98–111)
Creatinine, Ser: 0.51 mg/dL (ref 0.44–1.00)
GFR, Estimated: >60 mL/min (ref >60)
Glucose, Bld: 114 mg/dL — ABNORMAL HIGH (ref 70–99)
Potassium: 2.8 mmol/L — ABNORMAL LOW (ref 3.5–5.1)
Sodium: 136 mmol/L (ref 135–145)

## 2023-07-24 LAB — COMPREHENSIVE METABOLIC PANEL
ALT: 21 U/L (ref 0–44)
AST: 34 U/L (ref 15–41)
Albumin: 4 g/dL (ref 3.5–5.0)
Alkaline Phosphatase: 123 U/L (ref 38–126)
Anion gap: 15 (ref 5–15)
BUN: 9 mg/dL (ref 6–20)
CO2: 25 mmol/L (ref 22–32)
Calcium: 9.4 mg/dL (ref 8.9–10.3)
Chloride: 98 mmol/L (ref 98–111)
Creatinine, Ser: 0.65 mg/dL (ref 0.44–1.00)
GFR, Estimated: >60 mL/min (ref >60)
Glucose, Bld: 133 mg/dL — ABNORMAL HIGH (ref 70–99)
Potassium: 2.6 mmol/L — CL (ref 3.5–5.1)
Sodium: 138 mmol/L (ref 135–145)
Total Bilirubin: 1.1 mg/dL (ref 0.3–1.2)
Total Protein: 8.9 g/dL — ABNORMAL HIGH (ref 6.5–8.1)

## 2023-07-24 LAB — MAGNESIUM
Magnesium: 1.4 mg/dL — ABNORMAL LOW (ref 1.7–2.4)
Magnesium: 1.8 mg/dL (ref 1.7–2.4)

## 2023-07-24 LAB — URINALYSIS, ROUTINE W REFLEX MICROSCOPIC
Bilirubin Urine: NEGATIVE
Glucose, UA: NEGATIVE mg/dL
Ketones, ur: 5 mg/dL — AB
Nitrite: NEGATIVE
Protein, ur: NEGATIVE mg/dL
RBC / HPF: >50 RBC/hpf (ref 0–5)
Specific Gravity, Urine: >1.046 — ABNORMAL HIGH (ref 1.005–1.030)
WBC, UA: >50 WBC/hpf (ref 0–5)
pH: 6 (ref 5.0–8.0)

## 2023-07-24 LAB — CBC WITH DIFFERENTIAL/PLATELET
Abs Immature Granulocytes: 0.04 10*3/uL (ref 0.00–0.07)
Basophils Absolute: 0.1 10*3/uL (ref 0.0–0.1)
Basophils Relative: 0 %
Eosinophils Absolute: 0.1 10*3/uL (ref 0.0–0.5)
Eosinophils Relative: 1 %
HCT: 39.1 % (ref 36.0–46.0)
Hemoglobin: 13.2 g/dL (ref 12.0–15.0)
Immature Granulocytes: 0 %
Lymphocytes Relative: 11 %
Lymphs Abs: 1.4 10*3/uL (ref 0.7–4.0)
MCH: 37.1 pg — ABNORMAL HIGH (ref 26.0–34.0)
MCHC: 33.8 g/dL (ref 30.0–36.0)
MCV: 109.8 fL — ABNORMAL HIGH (ref 80.0–100.0)
Monocytes Absolute: 0.7 10*3/uL (ref 0.1–1.0)
Monocytes Relative: 5 %
Neutro Abs: 10.8 10*3/uL — ABNORMAL HIGH (ref 1.7–7.7)
Neutrophils Relative %: 83 %
Platelets: 327 10*3/uL (ref 150–400)
RBC: 3.56 MIL/uL — ABNORMAL LOW (ref 3.87–5.11)
RDW: 16.7 % — ABNORMAL HIGH (ref 11.5–15.5)
WBC: 13 10*3/uL — ABNORMAL HIGH (ref 4.0–10.5)
nRBC: 0 % (ref 0.0–0.2)

## 2023-07-24 LAB — SEDIMENTATION RATE: Sed Rate: 45 mm/hr — ABNORMAL HIGH (ref 0–22)

## 2023-07-24 LAB — TROPONIN I (HIGH SENSITIVITY)
Troponin I (High Sensitivity): 5 ng/L (ref <18)
Troponin I (High Sensitivity): 7 ng/L (ref <18)

## 2023-07-24 LAB — RAPID URINE DRUG SCREEN, HOSP PERFORMED
Amphetamines: NOT DETECTED
Barbiturates: NOT DETECTED
Benzodiazepines: POSITIVE — AB
Cocaine: NOT DETECTED
Opiates: POSITIVE — AB
Tetrahydrocannabinol: NOT DETECTED

## 2023-07-24 LAB — PHOSPHORUS: Phosphorus: 2.4 mg/dL — ABNORMAL LOW (ref 2.5–4.6)

## 2023-07-24 LAB — MRSA NEXT GEN BY PCR, NASAL: MRSA by PCR Next Gen: NOT DETECTED

## 2023-07-24 MED ORDER — SODIUM CHLORIDE 0.9 % IV SOLN: INTRAVENOUS | Status: DC | PRN

## 2023-07-24 MED ORDER — MORPHINE SULFATE (PF) 4 MG/ML IV SOLN
6.0000 mg | Freq: Once | INTRAVENOUS | Status: AC
  Administered 2023-07-24: 6 mg via INTRAVENOUS
  Filled 2023-07-24: qty 2

## 2023-07-24 MED ORDER — POTASSIUM CHLORIDE 10 MEQ/100ML IV SOLN
10.0000 meq | INTRAVENOUS | Status: AC
  Administered 2023-07-24 (×2): 10 meq via INTRAVENOUS
  Filled 2023-07-24 (×2): qty 100

## 2023-07-24 MED ORDER — METHYLPREDNISOLONE SODIUM SUCC 125 MG IJ SOLR
125.0000 mg | Freq: Once | INTRAMUSCULAR | Status: AC
  Administered 2023-07-24: 125 mg via INTRAVENOUS
  Filled 2023-07-24: qty 2

## 2023-07-24 MED ORDER — HYDROMORPHONE HCL 1 MG/ML IJ SOLN
1.0000 mg | Freq: Once | INTRAMUSCULAR | Status: DC
  Filled 2023-07-24: qty 1

## 2023-07-24 MED ORDER — ONDANSETRON HCL 4 MG/2ML IJ SOLN
4.0000 mg | Freq: Once | INTRAMUSCULAR | Status: DC
  Filled 2023-07-24: qty 2

## 2023-07-24 MED ORDER — LABETALOL HCL 5 MG/ML IV SOLN
20.0000 mg | Freq: Once | INTRAVENOUS | Status: AC
  Administered 2023-07-24: 20 mg via INTRAVENOUS
  Filled 2023-07-24: qty 4

## 2023-07-24 MED ORDER — HYDROMORPHONE HCL 1 MG/ML IJ SOLN
0.5000 mg | Freq: Once | INTRAMUSCULAR | Status: AC
  Administered 2023-07-24: 0.5 mg via INTRAVENOUS
  Filled 2023-07-24: qty 1

## 2023-07-24 MED ORDER — DOCUSATE SODIUM 100 MG PO CAPS: 100.0000 mg | ORAL_CAPSULE | Freq: Two times a day (BID) | ORAL | Status: DC | PRN

## 2023-07-24 MED ORDER — MAGNESIUM SULFATE 2 GM/50ML IV SOLN
2.0000 g | Freq: Once | INTRAVENOUS | Status: AC
  Administered 2023-07-25: 2 g via INTRAVENOUS
  Filled 2023-07-24: qty 50

## 2023-07-24 MED ORDER — ONDANSETRON HCL 40 MG/20ML IJ SOLN
8.0000 mg | Freq: Once | INTRAMUSCULAR | Status: DC
Start: 2023-07-24 — End: 2023-07-24

## 2023-07-24 MED ORDER — METHOCARBAMOL 1000 MG/10ML IJ SOLN
500.0000 mg | Freq: Three times a day (TID) | INTRAVENOUS | Status: DC | PRN
  Administered 2023-07-25 – 2023-07-26 (×5): 500 mg via INTRAVENOUS
  Filled 2023-07-24 (×5): qty 500

## 2023-07-24 MED ORDER — MAGNESIUM SULFATE 2 GM/50ML IV SOLN
2.0000 g | Freq: Once | INTRAVENOUS | Status: AC
  Administered 2023-07-24: 2 g via INTRAVENOUS
  Filled 2023-07-24: qty 50

## 2023-07-24 MED ORDER — ONDANSETRON HCL 4 MG/2ML IJ SOLN: 4.0000 mg | Freq: Four times a day (QID) | INTRAMUSCULAR | Status: DC | PRN

## 2023-07-24 MED ORDER — AMLODIPINE BESYLATE 5 MG PO TABS
10.0000 mg | ORAL_TABLET | Freq: Once | ORAL | Status: AC
  Administered 2023-07-24: 10 mg via ORAL
  Filled 2023-07-24: qty 2

## 2023-07-24 MED ORDER — ONDANSETRON HCL 4 MG/2ML IJ SOLN
4.0000 mg | Freq: Four times a day (QID) | INTRAMUSCULAR | Status: DC | PRN
  Administered 2023-07-24: 8 mg via INTRAVENOUS

## 2023-07-24 MED ORDER — POTASSIUM CHLORIDE CRYS ER 20 MEQ PO TBCR
40.0000 meq | EXTENDED_RELEASE_TABLET | Freq: Once | ORAL | Status: AC
  Administered 2023-07-25: 40 meq via ORAL
  Filled 2023-07-24: qty 2

## 2023-07-24 MED ORDER — IOHEXOL 350 MG/ML SOLN
100.0000 mL | Freq: Once | INTRAVENOUS | Status: AC | PRN
  Administered 2023-07-24: 100 mL via INTRAVENOUS

## 2023-07-24 MED ORDER — POTASSIUM CHLORIDE 10 MEQ/100ML IV SOLN
10.0000 meq | INTRAVENOUS | Status: AC
  Administered 2023-07-24 – 2023-07-25 (×4): 10 meq via INTRAVENOUS
  Filled 2023-07-24 (×4): qty 100

## 2023-07-24 MED ORDER — HEPARIN SODIUM (PORCINE) 5000 UNIT/ML IJ SOLN
5000.0000 [IU] | Freq: Three times a day (TID) | INTRAMUSCULAR | Status: DC
  Administered 2023-07-25 – 2023-07-29 (×10): 5000 [IU] via SUBCUTANEOUS
  Filled 2023-07-24 (×14): qty 1

## 2023-07-24 MED ORDER — HYDRALAZINE HCL 20 MG/ML IJ SOLN
10.0000 mg | Freq: Once | INTRAMUSCULAR | Status: AC
  Administered 2023-07-24: 10 mg via INTRAVENOUS
  Filled 2023-07-24: qty 1

## 2023-07-24 MED ORDER — NICARDIPINE HCL IN NACL 20-0.86 MG/200ML-% IV SOLN
3.0000 mg/h | INTRAVENOUS | Status: DC
  Administered 2023-07-24 – 2023-07-25 (×2): 5 mg/h via INTRAVENOUS
  Administered 2023-07-25: 3 mg/h via INTRAVENOUS
  Filled 2023-07-24 (×5): qty 200

## 2023-07-24 MED ORDER — ASPIRIN 325 MG PO TABS
325.0000 mg | ORAL_TABLET | Freq: Once | ORAL | Status: AC
  Administered 2023-07-24: 325 mg via ORAL
  Filled 2023-07-24: qty 1

## 2023-07-24 MED ORDER — DIAZEPAM 5 MG PO TABS
5.0000 mg | ORAL_TABLET | Freq: Once | ORAL | Status: AC
  Administered 2023-07-24: 5 mg via ORAL
  Filled 2023-07-24: qty 1

## 2023-07-24 MED ORDER — KETOROLAC TROMETHAMINE 30 MG/ML IJ SOLN
30.0000 mg | Freq: Once | INTRAMUSCULAR | Status: AC
  Administered 2023-07-24: 30 mg via INTRAVENOUS
  Filled 2023-07-24: qty 1

## 2023-07-24 MED ORDER — POLYETHYLENE GLYCOL 3350 17 G PO PACK
17.0000 g | PACK | Freq: Every day | ORAL | Status: DC | PRN
  Administered 2023-07-29: 17 g via ORAL
  Filled 2023-07-24: qty 1

## 2023-07-24 MED ORDER — POTASSIUM CHLORIDE CRYS ER 20 MEQ PO TBCR
40.0000 meq | EXTENDED_RELEASE_TABLET | Freq: Once | ORAL | Status: AC
  Administered 2023-07-24: 40 meq via ORAL
  Filled 2023-07-24: qty 2

## 2023-07-24 MED ORDER — CHLORHEXIDINE GLUCONATE CLOTH 2 % EX PADS
6.0000 | MEDICATED_PAD | Freq: Every day | CUTANEOUS | Status: DC
  Administered 2023-07-24 – 2023-07-26 (×3): 6 via TOPICAL

## 2023-07-24 MED ORDER — CARVEDILOL 3.125 MG PO TABS
3.1250 mg | ORAL_TABLET | Freq: Once | ORAL | Status: AC
  Administered 2023-07-24: 3.125 mg via ORAL
  Filled 2023-07-24: qty 1

## 2023-07-24 NOTE — ED Provider Notes (Signed)
[place] EMERGENCY DEPARTMENT AT [facility], [place] Provider Note   CSN: [id number] Arrival date & time: 07/24/23  1220     History  Chief Complaint  Patient presents with   Back Pain    Grace Young is a 57 y.o. female.  Patient is a 57 year old female with a history of hypertension that is poorly controlled even when she is taking her medications who is presenting today with severe left shoulder blade pain.  She woke up yesterday with the pain but it is gradually worsened in the last 36 hours.  She now reports even taking a deep breath causes severe pain.  Earlier the pain was radiating down her left arm causing numbness and tingling but that has resolved.  A little bit of pain in the flank but mostly in the left shoulder blade area.  No nausea vomiting, urinary changes.  She had a few episodes of loose stool this morning.  She tried taking Tylenol and ibuprofen yesterday without any improvement.  She has not had fever cough or infectious symptoms.  She last had her blood pressure medication 2 days ago because she ran out but reports she has refills at the pharmacy.  She states even when she is taking her blood pressure medicine it does not seem to work.  No history of back issues or prior procedures.  The history is provided by the patient.  Back Pain      Home Medications Prior to Admission medications   Medication Sig Start Date End Date Taking? Authorizing Provider  amLODipine (NORVASC) 10 MG tablet Take 1 tablet (10 mg total) by mouth daily. 05/19/23 08/17/23  Prosperi, Christian H, PA-C  carvedilol (COREG) 3.125 MG tablet Take 1 tablet (3.125 mg total) by mouth 2 (two) times daily with a meal. 05/19/23 08/17/23  Prosperi, Christian H, PA-C  diphenhydrAMINE (BENADRYL) 25 MG tablet Take 25 mg by mouth every 6 (six) hours as needed for allergies.    [provider]  lisinopril (ZESTRIL) 40 MG tablet Take 1 tablet (40 mg total) by mouth daily. 02/25/22 05/26/22  Terald Sleeper, MD  meloxicam (MOBIC) 7.5 MG tablet Take 1 tablet (7.5 mg total) by mouth daily. 05/19/23   Prosperi, Christian H, PA-C      Allergies    Patient has no known allergies.    Review of Systems   Review of Systems  Musculoskeletal:  Positive for back pain.    Physical Exam Updated Vital Signs BP (!) 205/139   Pulse (!) 103   Temp 98.8 F (37.1 C) (Oral)   Resp 20   Ht 5\' 8"  (1.727 m)   Wt 114 kg   SpO2 96%   BMI 38.21 kg/m  Physical Exam Vitals and nursing note reviewed.  Constitutional:      General: She is not in acute distress.    Appearance: She is well-developed.  HENT:     Head: Normocephalic and atraumatic.  Eyes:     Pupils: Pupils are equal, round, and reactive to light.  Cardiovascular:     Rate and Rhythm: Regular rhythm. Tachycardia present.     Pulses: Normal pulses.     Heart sounds: Normal heart sounds. No murmur heard.    No friction rub.  Pulmonary:     Effort: Pulmonary effort is normal.     Breath sounds: Normal breath sounds. No wheezing or rales.  Abdominal:     General: Bowel sounds are normal. There is no distension.  Palpations: Abdomen is soft.     Tenderness: There is no abdominal tenderness. There is no right CVA tenderness, left CVA tenderness, guarding or rebound.  Musculoskeletal:        General: No tenderness. Normal range of motion.       Back:     Right lower leg: No edema.     Left lower leg: No edema.     Comments: No edema  Skin:    General: Skin is warm and dry.     Findings: No rash.  Neurological:     General: No focal deficit present.     Mental Status: She is alert and oriented to person, place, and time. Mental status is at baseline.     Cranial Nerves: No cranial nerve deficit.     Sensory: No sensory deficit.     Motor: No weakness.  Psychiatric:        Behavior: Behavior normal.     ED Results / Procedures / Treatments   Labs (all labs ordered are listed, but only abnormal results are  displayed) Labs Reviewed  CBC WITH DIFFERENTIAL/PLATELET - Abnormal; Notable for the following components:      Result Value   WBC 13.0 (*)    RBC 3.56 (*)    MCV 109.8 (*)    MCH 37.1 (*)    RDW 16.7 (*)    Neutro Abs 10.8 (*)    All other components within normal limits  MAGNESIUM - Abnormal; Notable for the following components:   Magnesium 1.4 (*)    All other components within normal limits  COMPREHENSIVE METABOLIC PANEL - Abnormal; Notable for the following components:   Potassium 2.6 (*)    Glucose, Bld 133 (*)    Total Protein 8.9 (*)    All other components within normal limits  TROPONIN I (HIGH SENSITIVITY)  TROPONIN I (HIGH SENSITIVITY)    EKG EKG Interpretation Date/Time:  Wednesday July 24 2023 12:48:38 EDT Ventricular Rate:  113 PR Interval:  154 QRS Duration:  86 QT Interval:  353 QTC Calculation: 484 R Axis:   -13  Text Interpretation: Sinus tachycardia Ventricular premature complex Aberrant complex Consider right atrial enlargement LVH with secondary repolarization abnormality Anterior Q waves, possibly due to LVH Baseline wander in lead(s) III aVL aVF V4 No significant change since last tracing Confirmed by Linwood Dibbles [phone]) on 07/24/2023 1:04:01 PM  Radiology DG Chest 2 View  Result Date: 07/24/2023 CLINICAL DATA:  Chest pain EXAM: CHEST - 2 VIEW COMPARISON:  X-ray 10/14/2018 FINDINGS: Linear opacity at the bases likely scar or atelectasis. No consolidation, pneumothorax or effusion. No edema. Normal cardiopericardial silhouette. Degenerative changes are seen along the spine. IMPRESSION: Basilar scar or atelectasis. Electronically Signed   By: Karen Kays M.D.   On: 07/24/2023 13:57    Procedures Procedures    Medications Ordered in ED Medications  potassium chloride 10 mEq in 100 mL IVPB (has no administration in time range)  potassium chloride SA (KLOR-CON M) CR tablet 40 mEq (has no administration in time range)  magnesium sulfate IVPB 2 g 50 mL  (has no administration in time range)  aspirin tablet 325 mg (325 mg Oral Given 07/24/23 1346)  diazepam (VALIUM) tablet 5 mg (5 mg Oral Given 07/24/23 1346)  HYDROmorphone (DILAUDID) injection 0.5 mg (0.5 mg Intravenous Given 07/24/23 1347)  amLODipine (NORVASC) tablet 10 mg (10 mg Oral Given 07/24/23 1346)  carvedilol (COREG) tablet 3.125 mg (3.125 mg Oral Given 07/24/23 1346)  ED Course/ Medical Decision Making/ A&P                                 Medical Decision Making Amount and/or Complexity of Data Reviewed External Data Reviewed: notes. Labs: ordered. Decision-making details documented in ED Course. Radiology: ordered and independent interpretation performed. Decision-making details documented in ED Course. ECG/medicine tests: ordered and independent interpretation performed. Decision-making details documented in ED Course.  Risk Prescription drug management. Parenteral controlled substances.   Pt with multiple medical problems and comorbidities and presenting today with a complaint that caries a high risk for morbidity and mortality.  Here today with complaints of left shoulder pain.  Concern for dissection versus PE versus pneumothorax versus musculoskeletal etiology.  Lower suspicion for acute abdominal pathology.  Patient is hypertensive and tachycardic here.  This is also in the setting of not having her blood pressure medication 1 being a beta-blocker in the last 2 days.  Some of this may be reflex tachycardia.  Patient did report receiving fentanyl by EMS and did have improvement in her pain.  Will give Valium to help for muscle relaxation as well as further pain medication while evaluating for the above.  I independently interpreted patient's EKG which shows sinus tachycardia but no other acute changes.    2:51 PM I independently interpreted patient's labs and CBC with mild leukocytosis of 13 with stable hemoglobin, magnesium mildly low at 1.4, troponin within normal limits  and CMP with hypokalemia with potassium of 2.6 today with normal renal function, calcium and LFTs.  Patient given IV and oral potassium replacement.  I have independently visualized and interpreted pt's images today.  Chest x-ray without acute findings. CTA to rule out dissection/PE pending.         Final Clinical Impression(s) / ED Diagnoses Final diagnoses:  None    Rx / DC Orders ED Discharge Orders     None         Gwyneth Sprout, MD 07/27/23 1815

## 2023-07-24 NOTE — ED Provider Triage Note (Cosign Needed Addendum)
Emergency Medicine Provider Triage Evaluation Note  Grace Young , a 57 y.o. female  was evaluated in triage.  Pt complains of back pain that started yesterday.  Had some left-sided chest pain today.  Significantly elevated blood pressure.  Does have history of uncontrolled hypertension.  She was given 150 mcg with EMS.  Reports significant improvement of pain.  Currently is without chest pain.  Review of Systems  Positive: As above Negative: As above  Physical Exam  BP (!) 228/142 (BP Location: Right Arm)   Pulse (!) 119   Temp 98.8 F (37.1 C) (Oral)   Resp 18   Ht 5\' 8"  (1.727 m)   Wt 114 kg   SpO2 98%   BMI 38.21 kg/m  Gen:   Awake, no distress   Resp:  Normal effort  MSK:   Moves extremities without difficulty  Other:    Medical Decision Making  Medically screening exam initiated at 12:47 PM.  Appropriate orders placed.  Grace Young was informed that the remainder of the evaluation will be completed by another provider, this initial triage assessment does not replace that evaluation, and the importance of remaining in the ED until their evaluation is complete.     Grace Kansas, PA-C 07/24/23 1248    Grace Kansas, PA-C 07/24/23 1341

## 2023-07-24 NOTE — Telephone Encounter (Signed)
  Chief Complaint: severe pain in back and can not get out of bed Symptoms: worse pain ever experienced left upper back severe pain muscle cramp with any movement and esp. Breathing in . Any slight movement causes severe distress Frequency: yesterday and worse today  Pertinent Negatives: Patient denies chest pain  Disposition: [x] ED /[] Urgent Care (no appt availability in office) / [] Appointment(In office/virtual)/ []  [facility]/ [] Home Care/ [] Refused Recommended Disposition /[] [facility]/ []  Follow-up with PCP Additional Notes:   Unable to get out of bed recommended to call 911.    Reason for Disposition  Sounds like a life-threatening emergency to the triager  Answer Assessment - Initial Assessment Questions 1. ONSET: "When did the pain begin?"      Yesterday and worse today  2. LOCATION: "Where does it hurt?" (upper, mid or lower back)     Left upper back  3. SEVERITY: "How bad is the pain?"  (e.g., Scale 1-10; mild, moderate, or severe)   - MILD (1-3): Doesn't interfere with normal activities.    - MODERATE (4-7): Interferes with normal activities or awakens from sleep.    - SEVERE (8-10): Excruciating pain, unable to do any normal activities.      Severe can not get out of bed 4. PATTERN: "Is the pain constant?" (e.g., yes, no; constant, intermittent)    Constant and severe pain with any movement 5. RADIATION: "Does the pain shoot into your legs or somewhere else?"     na 6. CAUSE:  "What do you think is causing the back pain?"      Not sure muscle cramp 7. BACK OVERUSE:  "Any recent lifting of heavy objects, strenuous work or exercise?"     na 8. MEDICINES: "What have you taken so far for the pain?" (e.g., nothing, acetaminophen, NSAIDS)     na 9. NEUROLOGIC SYMPTOMS: "Do you have any weakness, numbness, or problems with bowel/bladder control?"     Na  10. OTHER SYMPTOMS: "Do you have any other symptoms?" (e.g., fever, abdomen pain, burning with  urination, blood in urine)       Severe cramping pain with any movement. Esp breathing in . Worse pain ever experienced. 11. PREGNANCY: "Is there any chance you are pregnant?" "When was your last menstrual period?"       na  Protocols used: Back Pain-A-AH

## 2023-07-24 NOTE — Telephone Encounter (Signed)
noted 

## 2023-07-24 NOTE — H&P (Signed)
NAME:  Grace Young, MRN:  [id number], DOB:  1966-07-10, LOS: 0 ADMISSION DATE:  07/24/2023 CONSULTATION DATE:  07/24/2023 REFERRING MD:  Freida Busman - EDP CHIEF COMPLAINT:  Severe back pain, hypertensive crisis   History of Present Illness:  57 year old woman who presented to [facility] ED 7/31 with severe L-sided back/shoulder pain x 1 day, CP. PMHx significant for HTN, Bell's palsy.  On EMS arrival, patient was having severe L-sided back pain and unable to get out of bed. Pain has been refractory to Tylenol/ibuprofen. Reportedly worst near L shoulder/shoulder blade, worse with deep breaths and radiating down patient's L arm with +paresthesias. 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 Denies fever/chills, cough, dysuria. Endorses nausea/vomiting and a few episodes of loose stools. Given Fentanyl with EMS without relief. On ED arrival, patient was hypertensive to 228/142. Reportedly out of BP medications x 2 days, but has uncontrolled HTN even when taking her medications.  Labs were notable for WBC 13, Hgb 13.2 (MCV 109.8), Plt 327. Na 138, K 2.6, CO2 25, Cr 0.65. Mg 1.4. LFTs WNL. Trops 5 > 7. CXR with basilar scar v. atelectasis. CTA Chest/A/P obtained to r/o aortic dissection; normal aorta without aneurysm/dissection/acute pathology, mild aortic atherosclerosis, extensive bibasilar scarring/atelectasis with GGOs, cardiomegaly, hepatic steatosis.  Despite conservative management with PO/IV BP medications, patient remained profoundly hypertensive requiring Cardene gtt initiation. PCCM consulted for ICU admission.  Pertinent Medical History:   Past Medical History:  Diagnosis Date   Bell's palsy    Hypertension    Significant Hospital Events: Including procedures, antibiotic start and stop dates in addition to other pertinent events   7/31 - Presented to [facility] ED with severe L-sided back pain, CP. Hypertensive to 220s/140s. CTA Chest/A/P negative for  dissection. Cardene started for hypertension. PCCM consulted for admission.  Interim History / Subjective:  PCCM consulted for ICU admission in the setting of Cardene gtt.  Objective:  Blood pressure (!) 226/138, pulse (!) 103, temperature 97.8 F (36.6 C), temperature source Oral, resp. rate 20, height 5\' 8"  (1.727 m), weight 114 kg, SpO2 96%.        Intake/Output Summary (Last 24 hours) at 07/24/2023 1957 Last data filed at 07/24/2023 1824 Gross per 24 hour  Intake 240.92 ml  Output --  Net 240.92 ml   Filed Weights   07/24/23 1230  Weight: 114 kg   Physical Examination: General: Acutely ill-appearing NAD. HEENT: [place]/AT, anicteric sclera, PERRL, moist mucous membranes. Neuro: Awake, oriented x 3. Responds to verbal stimuli., Responds to tactile stimuli., and Responds to noxious stimuli. Following commands consistently. Moves all 4 extremities spontaneously.  CV: RRR, no m/g/r. PULM: Breathing even and unlabored, CTAB GI: Soft, nontender, mildly distended. Normoactive bowel sounds. Extremities: no LE edema noted, left shoulder warm to touch and painful  Skin: Warm/dry, .  Resolved Hospital Problem List:    Assessment & Plan:  Hypertensive crisis Presented to [facility] ED 7/31 for severe L-sided back pain/CP. Profoundly hypertensive to 220s/140s. Fortunately, CTA Chest/A/P negative for aortic dissection/aneurysm. - Admit to ICU for further care - Cardene gtt titrated to goal SBP - Goal MAP reduction 10-20% in the first hour, additional 5-15% over the next 23 hours. - Target BP <180/<120 mmHg for the first hour and <160/<110 mmHg for the next 23 hours  - Will resume home meds as able - F/u UA - F/u Echo, none in our system - Consider US Renal Doppler to  evaluate for RAS if persistent, refractory hypertension  L shoulder/back pain, ?septic joint vs acute inflammatory arthritis? Chest pain Trops flat, no EKG changes. Severe L shoulder pain and tenderness. - Multimodal pain  control, Toradol, maximize adjuncts as able (APAP, lido patches) - F/u L shoulder XR - Consider additional imaging (CT/MRI, ?[place]), pending results - Consider L shoulder joint aspiration, if effusion present - F/u ESR/CRP  Hypokalemia Hypomagnesemia - Trend BMP - Replete electrolytes as indicated - Goal K > 4, Mg > 2 - Monitor I&Os  Anemia, macrocytic - F/u B12, further anemia workup if indicated  Hepatic steatosis Incidentally noted on CTA Chest/A/P. - Monitor LFTs, normal on admission  Best Practice: (right click and "Reselect all SmartList Selections" daily)   Diet/type: Regular consistency (see orders) DVT prophylaxis: SCDs, SQH GI prophylaxis: N/A Lines: N/A Foley:  N/A Code Status:  full code Last date of multidisciplinary goals of care discussion [Pending]  Labs:  CBC: Recent Labs  Lab 07/24/23 1330  WBC 13.0*  NEUTROABS 10.8*  HGB 13.2  HCT 39.1  MCV 109.8*  PLT 327   Basic Metabolic Panel: Recent Labs  Lab 07/24/23 1330  NA 138  K 2.6*  CL 98  CO2 25  GLUCOSE 133*  BUN 9  CREATININE 0.65  CALCIUM 9.4  MG 1.4*   GFR: Estimated Creatinine Clearance: 102.8 mL/min (by C-G formula based on SCr of 0.65 mg/dL). Recent Labs  Lab 07/24/23 1330  WBC 13.0*   Liver Function Tests: Recent Labs  Lab 07/24/23 1330  AST 34  ALT 21  ALKPHOS 123  BILITOT 1.1  PROT 8.9*  ALBUMIN 4.0   No results for input(s): "LIPASE", "AMYLASE" in the last 168 hours. No results for input(s): "AMMONIA" in the last 168 hours.  ABG:    Component Value Date/Time   TCO2 22 09/30/2014 0008    Coagulation Profile: No results for input(s): "INR", "PROTIME" in the last 168 hours.  Cardiac Enzymes: No results for input(s): "CKTOTAL", "CKMB", "CKMBINDEX", "TROPONINI" in the last 168 hours.  HbA1C: Hgb A1c MFr Bld  Date/Time Value Ref Range Status  05/09/2019 05:13 AM 5.4 4.8 - 5.6 % Final    Comment:    (NOTE) Pre diabetes:          5.7%-6.4% Diabetes:               >6.4% Glycemic control for   <7.0% adults with diabetes    CBG: No results for input(s): "GLUCAP" in the last 168 hours.  Review of Systems:   Review of Systems  Constitutional:  Negative for chills, fever, malaise/fatigue and weight loss.  HENT:  Negative for hearing loss, sore throat and tinnitus.   Eyes:  Negative for blurred vision and double vision.  Respiratory:  Negative for cough, hemoptysis, sputum production, shortness of breath, wheezing and stridor.   Cardiovascular:  Negative for chest pain, palpitations, orthopnea, leg swelling and PND.  Gastrointestinal:  Positive for nausea and vomiting. Negative for abdominal pain, constipation, diarrhea and heartburn.  Genitourinary:  Negative for dysuria, hematuria and urgency.  Musculoskeletal:  Positive for back pain and joint pain. Negative for myalgias.  Skin:  Negative for itching and rash.  Neurological:  Positive for headaches. Negative for dizziness, tingling and weakness.  Endo/Heme/Allergies:  Negative for environmental allergies. Does not bruise/bleed easily.  Psychiatric/Behavioral:  Negative for depression. The patient is not nervous/anxious and does not have insomnia.   All other systems reviewed and are negative.    Past Medical  History:  She,  has a past medical history of Bell's palsy and Hypertension.   Surgical History:   Past Surgical History:  Procedure Laterality Date   cyst removal from uterus and ovaries     TONSILLECTOMY     Social History:   reports that she has never smoked. She has never used smokeless tobacco. She reports current alcohol use of about 1.0 standard drink of alcohol per week. She reports that she does not use drugs.   Family History:  Her family history includes Breast cancer in her cousin; Hypertension in her sister. There is no history of Diabetes Mellitus II or Diabetes.   Allergies: No Known Allergies   Home Medications: Prior to Admission medications   Medication Sig  Start Date End Date Taking? Authorizing Provider  lisinopril (ZESTRIL) 40 MG tablet Take 1 tablet (40 mg total) by mouth daily. 02/25/22 07/24/23 Yes Trifan, Kermit Balo, MD  amLODipine (NORVASC) 10 MG tablet Take 1 tablet (10 mg total) by mouth daily. 05/19/23 08/17/23  Prosperi, Christian H, PA-C  carvedilol (COREG) 3.125 MG tablet Take 1 tablet (3.125 mg total) by mouth 2 (two) times daily with a meal. 05/19/23 08/17/23  Prosperi, Christian H, PA-C  diphenhydrAMINE (BENADRYL) 25 MG tablet Take 25 mg by mouth every 6 (six) hours as needed for allergies.    [provider]  meloxicam (MOBIC) 7.5 MG tablet Take 1 tablet (7.5 mg total) by mouth daily. 05/19/23   Olene Floss, PA-C    Critical care time:     Faythe Ghee [facility] 07/24/23 7:57 PM  Please see [email or web address] for pager details.  From 7A-7P if no response, please call [phone] After hours, please call Pola Corn [phone]   This patient is critically ill with multiple organ system failure; which, requires frequent high complexity decision making, assessment, support, evaluation, and titration of therapies. This was completed through the application of advanced monitoring technologies and extensive interpretation of multiple databases. During this encounter critical care time was devoted to patient care services described in this note for 32 minutes.  Cloyd Stagers, PA helped document in the EMR for the note. I saw an examined the patient as documented above.  Josephine Igo, DO [facility] 07/24/2023 10:29 PM

## 2023-07-24 NOTE — Progress Notes (Signed)
eLink Physician-Brief Progress Note Patient Name: Grace Young DOB: October 16, 1966 MRN: [id number]   Date of Service  07/24/2023  HPI/Events of Note  Patient seen in ED for left shoulder pain and found to have a hypertensive emergency requiring Cardene gtt.  CTA was negative for dissection and Troponin was not consistent with acute coronary syndrome.  eICU Interventions  New Patient Evaluation.        Migdalia Dk 07/24/2023, 11:25 PM

## 2023-07-24 NOTE — ED Provider Notes (Signed)
Patient signed to me by Dr. Anitra Lauth pending results of the patient's workup.  Patient was severely hypertensive here and concern for possible dissection.  Patient was hypertensive here.  She had been out of her medications for several days.  Dissection study per my review interpretation were negative.  She has remained severely hypertensive.  Given labetalol as well as hydralazine still remains hypertensive.  Patient also had muscle pain which was treated with IV analgesics limited relief.  She started having emesis.  Patient will require admission will consult hospitalist team   Lorre Nick, MD 07/24/23 1925

## 2023-07-24 NOTE — ED Triage Notes (Addendum)
Patient BIB EMS for upper left sided back pain starting yesterday. Given 150 mcg of fentanyl with EMS with little relief. Patient denies injury. BP on arrival 228/142. Reports she does not take her BP meds.

## 2023-07-25 ENCOUNTER — Inpatient Hospital Stay (HOSPITAL_COMMUNITY): Payer: Medicaid Other

## 2023-07-25 DIAGNOSIS — I161 Hypertensive emergency: Secondary | ICD-10-CM | POA: Diagnosis not present

## 2023-07-25 MED ORDER — SODIUM CHLORIDE 0.9 % IV SOLN: INTRAVENOUS | Status: DC | PRN

## 2023-07-25 MED ORDER — ORAL CARE MOUTH RINSE: 15.0000 mL | OROMUCOSAL | Status: DC | PRN

## 2023-07-25 MED ORDER — HYDRALAZINE HCL 20 MG/ML IJ SOLN
10.0000 mg | Freq: Four times a day (QID) | INTRAMUSCULAR | Status: DC | PRN
  Administered 2023-07-26 – 2023-07-28 (×3): 10 mg via INTRAVENOUS
  Filled 2023-07-25 (×3): qty 1

## 2023-07-25 MED ORDER — KETOROLAC TROMETHAMINE 15 MG/ML IJ SOLN
15.0000 mg | Freq: Four times a day (QID) | INTRAMUSCULAR | Status: DC | PRN
  Administered 2023-07-25 – 2023-07-29 (×12): 15 mg via INTRAVENOUS
  Filled 2023-07-25 (×13): qty 1

## 2023-07-25 MED ORDER — LIDOCAINE 5 % EX PTCH
1.0000 | MEDICATED_PATCH | CUTANEOUS | Status: DC
  Administered 2023-07-25 – 2023-07-28 (×2): 1 via TRANSDERMAL
  Filled 2023-07-25 (×5): qty 1

## 2023-07-25 MED ORDER — HYDRALAZINE HCL 20 MG/ML IJ SOLN: 10.0000 mg | Freq: Four times a day (QID) | INTRAMUSCULAR | Status: DC | PRN

## 2023-07-25 MED ORDER — CYANOCOBALAMIN 1000 MCG/ML IJ SOLN
1000.0000 ug | Freq: Once | INTRAMUSCULAR | Status: DC
  Filled 2023-07-25: qty 1

## 2023-07-25 MED ORDER — LISINOPRIL 20 MG PO TABS
20.0000 mg | ORAL_TABLET | Freq: Every day | ORAL | Status: DC
  Administered 2023-07-25 – 2023-07-28 (×4): 20 mg via ORAL
  Filled 2023-07-25: qty 1
  Filled 2023-07-25: qty 2
  Filled 2023-07-25: qty 1
  Filled 2023-07-25: qty 2

## 2023-07-25 MED ORDER — VITAMIN B-12 1000 MCG PO TABS
1000.0000 ug | ORAL_TABLET | Freq: Every day | ORAL | Status: DC
  Administered 2023-07-25 – 2023-07-29 (×5): 1000 ug via ORAL
  Filled 2023-07-25 (×5): qty 1

## 2023-07-25 MED ORDER — AMLODIPINE BESYLATE 10 MG PO TABS
10.0000 mg | ORAL_TABLET | Freq: Every day | ORAL | Status: DC
  Administered 2023-07-25 – 2023-07-29 (×5): 10 mg via ORAL
  Filled 2023-07-25 (×5): qty 1

## 2023-07-25 MED ORDER — MELATONIN 3 MG PO TABS
3.0000 mg | ORAL_TABLET | Freq: Every day | ORAL | Status: AC
  Administered 2023-07-25 – 2023-07-27 (×3): 3 mg via ORAL
  Filled 2023-07-25 (×3): qty 1

## 2023-07-25 MED ORDER — PERFLUTREN LIPID MICROSPHERE
1.0000 mL | INTRAVENOUS | Status: AC | PRN
  Administered 2023-07-25: 2 mL via INTRAVENOUS

## 2023-07-25 MED ORDER — CARVEDILOL 6.25 MG PO TABS
6.2500 mg | ORAL_TABLET | Freq: Two times a day (BID) | ORAL | Status: DC
  Administered 2023-07-25 – 2023-07-28 (×7): 6.25 mg via ORAL
  Filled 2023-07-25 (×7): qty 1

## 2023-07-25 NOTE — Progress Notes (Signed)
  Echocardiogram 2D Echocardiogram has been performed.  Grace Young 07/25/2023, 3:22 PM

## 2023-07-25 NOTE — Evaluation (Addendum)
Physical Therapy Evaluation Patient Details Name: Grace Young MRN: [id number] DOB: 04/01/1966 Today's Date: 07/25/2023  History of Present Illness  57 year old woman who presented to [facility] ED 7/31 with severe L-sided back/shoulder pain x 1 day, CP. PMHx significant for HTN, Bell's palsy.      Reportedly worst near L shoulder/shoulder blade, worse with deep breaths and radiating down patient's L arm with +paresthesias. 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  On ED arrival, patient was hypertensive to 228/142. Reportedly out of BP medications x 2 days, but has uncontrolled HTN even when taking her medications.  Clinical Impression  Pt admitted with above diagnosis.  Pt currently with functional limitations due to the deficits listed below (see PT Problem List). Pt will benefit from acute skilled PT to increase their independence and safety with mobility to allow discharge.     The patient tolerated well, patient reports several days of LUE dysfunction. Patient noted with limited use during mobility., Did note able to bear weight through left arm on Rw for transfers.  Patient also indicating multiple body part areas of pain and weakness. Demonstrates decreased left knee extension in sitting, using right foot to lift left foot. Patient   reports right thigh pain and upper back pain radiating across to right side when sitting and standing.  Patient reports multiple "gell shots" in each knee, has had difficulty  with negotiating steps x 1 year due to left knee issues, has not been able to work for some time.  Continue PT fr  safe mobility.   BP151/88      If plan is discharge home, recommend the following: Assistance with cooking/housework;A lot of help with walking and/or transfers;A little help with bathing/dressing/bathroom;Help with stairs or ramp for entrance;Assist for transportation   Can  travel by private vehicle    no    Equipment Recommendations  Asking for quad cane  Recommendations for Other Services  OT consult    Functional Status Assessment Patient has had a recent decline in their functional status and demonstrates the ability to make significant improvements in function in a reasonable and predictable amount of time.     Precautions / Restrictions Precautions Precautions: Fall Precaution Comments: pain, left shouler and back, Left knee can buckle Restrictions Weight Bearing Restrictions: No      Mobility  Bed Mobility Overal bed mobility: Needs Assistance Bed Mobility: Supine to Sit     Supine to sit: Mod assist, HOB elevated     General bed mobility comments: use of bed rail assist trunk to upright    Transfers Overall transfer level: Needs assistance Equipment used: Rolling walker (2 wheels) Transfers: Sit to/from Stand, Bed to chair/wheelchair/BSC Sit to Stand: Mod assist           General transfer comment: Mod support and RW  and use of bed rail to power up to standing. Step pivot to recliner, able to bear weight on RW with LUE. Noted DOE.     Ambulation/Gait                  Stairs            Wheelchair Mobility     Tilt Bed    Modified Rankin (Stroke Patients Only)       Balance Overall balance assessment: Needs assistance Sitting-balance support: Single extremity supported, Feet supported Sitting balance-Leahy Scale:  Fair     Standing balance support: During functional activity, Bilateral upper extremity supported, Reliant on assistive device for balance Standing balance-Leahy Scale: Poor Standing balance comment: reliant on RW  for balance                             Pertinent Vitals/Pain Pain Assessment Pain Assessment: 0-10 Pain Score: 10-Worst pain ever Pain Location: left ant. shoulder, left side back rhomboid are, right ankle , left knee Pain Descriptors / Indicators: Discomfort,  Grimacing, Dull    Home Living Family/patient expects to be discharged to:: Private residence Living Arrangements: Children Available Help at Discharge: Family;Available PRN/intermittently Type of Home: House Home Access: Stairs to enter   Entrance Stairs-Number of Steps: 1 big Alternate Level Stairs-Number of Steps: 14 Left rail Home Layout: 1/2 bath on main level Home Equipment: Rolling Walker (2 wheels);Cane - single point      Prior Function Prior Level of Function : Needs assist       Physical Assist : Mobility (physical) Mobility (physical): Gait;Stairs   Mobility Comments: using SPC, takes 1 step at a time x 1 yr, has not worked recently       Higher education careers adviser   Dominant Hand: Right    Extremity/Trunk Assessment   Upper Extremity Assessment Upper Extremity Assessment: Defer to OT evaluation;LUE deficits/detail LUE Deficits / Details: decreased sgouler abd, flexion, painful to palpate anterior shoulder( AWAIT ot EVAL FOR MORE DETAILS) Pt. reports no fall, no truma ther than cooking    Lower Extremity Assessment Lower Extremity Assessment: LLE deficits/detail;Defer to PT evaluation;RLE deficits/detail RLE Sensation: WNL RLE Coordination: WNL LLE Deficits / Details: knee ext2/5, uses  right leg to extend knee, reports pain when attempts to extend knee, Dorsi/plant flex WFL    Cervical / Trunk Assessment Cervical / Trunk Assessment: Other exceptions Cervical / Trunk Exceptions: tends to lean to right, offset from left side  Communication   Communication: No difficulties  Cognition Arousal/Alertness: Awake/alert Behavior During Therapy: WFL for tasks assessed/performed Overall Cognitive Status: Within Functional Limits for tasks assessed                                          General Comments      Exercises     Assessment/Plan    PT Assessment Patient needs continued PT services  PT Problem List Decreased strength;Decreased  mobility;Decreased range of motion;Decreased knowledge of precautions;Decreased activity tolerance;Decreased balance;Pain       PT Treatment Interventions DME instruction;Therapeutic activities;Gait training;Therapeutic exercise;Patient/family education;Stair training;Functional mobility training    PT Goals (Current goals can be found in the Care Plan section)  Acute Rehab PT Goals Patient Stated Goal: to get back to work, walk without pain, use my left arm PT Goal Formulation: With patient Time For Goal Achievement: 08/08/23 Potential to Achieve Goals: Good    Frequency Min 1X/week     Co-evaluation               AM-PAC PT "6 Clicks" Mobility  Outcome Measure Help needed turning from your back to your side while in a flat bed without using bedrails?: A Lot Help needed moving from lying on your back to sitting on the side of a flat bed without using bedrails?: A Lot Help needed moving to and from a bed to a chair (including a wheelchair)?:  A Lot Help needed standing up from a chair using your arms (e.g., wheelchair or bedside chair)?: A Lot Help needed to walk in hospital room?: Total Help needed climbing 3-5 steps with a railing? : Total 6 Click Score: 10    End of Session Equipment Utilized During Treatment: Gait belt Activity Tolerance: Patient limited by pain Patient left: in chair;with call bell/phone within reach;with chair alarm set Nurse Communication: Mobility status PT Visit Diagnosis: Unsteadiness on feet (R26.81);Muscle weakness (generalized) (M62.81);Difficulty in walking, not elsewhere classified (R26.2);Pain Pain - Right/Left: Left Pain - part of body: Arm;Shoulder    Time: 1610-9604 PT Time Calculation (min) (ACUTE ONLY): 38 min   Charges:   PT Evaluation $PT Eval Low Complexity: 1 Low PT Treatments $Therapeutic Activity: 23-37 mins PT General Charges $$ ACUTE PT VISIT: 1 Visit         Blanchard Kelch PT Acute Rehabilitation Services Office  [phone] Weekend [phone]   Rada Hay 07/25/2023, 2:18 PM

## 2023-07-25 NOTE — Progress Notes (Signed)
NAME:  Grace Young, MRN:  [id number], DOB:  07/30/1966, LOS: 1 ADMISSION DATE:  07/24/2023 CONSULTATION DATE:  07/24/2023 REFERRING MD:  Freida Busman - EDP CHIEF COMPLAINT:  Severe back pain, hypertensive crisis   History of Present Illness:  57 year old woman who presented to [facility] ED 7/31 with severe L-sided back/shoulder pain x 1 day, CP. PMHx significant for HTN, Bell's palsy.  On EMS arrival, patient was having severe L-sided back pain and unable to get out of bed. Pain has been refractory to Tylenol/ibuprofen. Reportedly worst near L shoulder/shoulder blade, worse with deep breaths and radiating down patient's L arm with +paresthesias. 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 Denies fever/chills, cough, dysuria. Endorses nausea/vomiting and a few episodes of loose stools. Given Fentanyl with EMS without relief. On ED arrival, patient was hypertensive to 228/142. Reportedly out of BP medications x 2 days, but has uncontrolled HTN even when taking her medications.  Labs were notable for WBC 13, Hgb 13.2 (MCV 109.8), Plt 327. Na 138, K 2.6, CO2 25, Cr 0.65. Mg 1.4. LFTs WNL. Trops 5 > 7. CXR with basilar scar v. atelectasis. CTA Chest/A/P obtained to r/o aortic dissection; normal aorta without aneurysm/dissection/acute pathology, mild aortic atherosclerosis, extensive bibasilar scarring/atelectasis with GGOs, cardiomegaly, hepatic steatosis.  Despite conservative management with PO/IV BP medications, patient remained profoundly hypertensive requiring Cardene gtt initiation. PCCM consulted for ICU admission.  Pertinent Medical History:   Past Medical History:  Diagnosis Date   Bell's palsy    Hypertension    Significant Hospital Events: Including procedures, antibiotic start and stop dates in addition to other pertinent events   7/31 - Presented to [facility] ED with severe L-sided back pain, CP. Hypertensive to 220s/140s. CTA Chest/A/P negative for  dissection. Cardene started for hypertension. PCCM consulted for admission.  Interim History / Subjective:  Cardene weaned to 3. Complains of left shoulder pain  Objective:  Blood pressure (!) 156/93, pulse (!) 105, temperature 99 F (37.2 C), temperature source Oral, resp. rate 13, height 5\' 8"  (1.727 m), weight 108.9 kg, SpO2 94%.        Intake/Output Summary (Last 24 hours) at 07/25/2023 0718 Last data filed at 07/25/2023 0400 Gross per 24 hour  Intake 1033.95 ml  Output 275 ml  Net 758.95 ml   Filed Weights   07/24/23 1230 07/24/23 2230 07/25/23 0455  Weight: 114 kg 108.9 kg 108.9 kg   Physical Exam: General: Well-appearing, no acute distress HENT: , AT, OP clear, MMM Eyes: EOMI, no scleral icterus Respiratory: Clear to auscultation bilaterally.  No crackles, wheezing or rales Cardiovascular: RRR, -M/R/G, no JVD GI: BS+, soft, nontender Extremities:Limited range of motion on left shoulder, -edema,-tenderness Neuro: AAO x4, CNII-XII grossly intact Psych: Normal mood, normal affect   Resolved Hospital Problem List:    Assessment & Plan:  Hypertensive crisis Presented to [facility] ED 7/31 for severe L-sided back pain/CP. Profoundly hypertensive to 220s/140s. Fortunately, CTA Chest/A/P negative for aortic dissection/aneurysm. - Wean cardene gtt titrated to goal SBP - Goal MAP reduction 10-20% in the first hour, additional 5-15% over the next 23 hours. - Target <160/<110 mmHg  - Start amlodipine 10 mg daily - Start/increase coreg 6.25 mg BID - Start lisinopril 20 mg daily - F/u Echo, none in our system - Consider US Renal Doppler to evaluate for RAS if persistent, refractory hypertension  L shoulder/back pain, ?septic joint vs acute inflammatory arthritis? Chest pain Trops flat,  no EKG changes. Severe L shoulder pain and tenderness. Left Shoulder XR 7/31 - No acute fracture Mildly elevated CRP/ESR - Multimodal pain control, Toradol, maximize adjuncts as able (APAP, lido  patches) - PT ordered - Consider additional imaging (CT/MRI, ?[place]) as outpatient  Hypokalemia Hypomagnesemia - Trend BMP - Replete electrolytes as indicated - Goal K > 4, Mg > 2 - Monitor I&Os  Anemia, macrocytic B12 deficiency -IM B12 today once -Start cyanocobalamin 1000 mcg daily  Hepatic steatosis Incidentally noted on CTA Chest/A/P. - Monitor LFTs, normal on admission  Best Practice: (right click and "Reselect all SmartList Selections" daily)   Diet/type: Regular consistency (see orders) DVT prophylaxis: SCDs, SQH GI prophylaxis: N/A Lines: N/A Foley:  N/A Code Status:  full code Last date of multidisciplinary goals of care discussion [Pending]   The patient is critically ill with multiple organ systems failure and requires high complexity decision making for assessment and support, frequent evaluation and titration of therapies, application of advanced monitoring technologies and extensive interpretation of multiple databases.  Independent Critical Care Time: 32 Minutes.   Mechele Collin, M.D. [facility] Pulmonary/Critical Care Medicine 07/25/2023 7:18 AM   Please see Amion for pager number to reach on-call Pulmonary and Critical Care Team.

## 2023-07-25 NOTE — Progress Notes (Signed)
eLink Physician-Brief Progress Note Patient Name: Grace Young DOB: 02-09-1966 MRN: [id number]   Date of Service  07/25/2023  HPI/Events of Note  Requesting PRN Melatonin  Also PRN hydralazine paramters are incorrect. It should say SBP >160 instead of SBP >100  Patient seen awake being changed  eICU Interventions  Ordered prn melatonin Hydralazine order revised     Intervention Category Minor Interventions: Routine modifications to care plan (e.g. PRN medications for pain, fever)  Irving Burton T Lukisha Procida 07/25/2023, 10:00 PM

## 2023-07-25 NOTE — Plan of Care (Signed)

## 2023-07-26 ENCOUNTER — Inpatient Hospital Stay (HOSPITAL_COMMUNITY): Payer: Medicaid Other

## 2023-07-26 ENCOUNTER — Encounter (HOSPITAL_COMMUNITY): Payer: Medicaid Other

## 2023-07-26 DIAGNOSIS — I161 Hypertensive emergency: Secondary | ICD-10-CM | POA: Diagnosis not present

## 2023-07-26 LAB — PROCALCITONIN: Procalcitonin: 0.46 ng/mL

## 2023-07-26 MED ORDER — METHYLPREDNISOLONE SODIUM SUCC 40 MG IJ SOLR
40.0000 mg | Freq: Once | INTRAMUSCULAR | Status: AC
  Administered 2023-07-26: 40 mg via INTRAVENOUS
  Filled 2023-07-26: qty 1

## 2023-07-26 NOTE — Progress Notes (Signed)
   07/26/23 1550  Spiritual Encounters  Type of Visit Initial  Care provided to: Patient  Referral source Other (comment) (Spiritual Consult)  Reason for visit Advance directives  OnCall Visit No   Chaplain responded to a spiritual consult for advanced directive education.  The patient, Grace Young, was alert and welcoming however did not feel up to going over the documents.  I left the packet with her and advised her if she had any questions to let her nurse know and someone would respond.   Valerie Roys [facility]  [phone]

## 2023-07-26 NOTE — Hospital Course (Signed)
Grace Young is a 57 y.o. female with a history of primary hypertension, Bell's palsy.  Patient presented secondary to left-sided back pain and was found to have evidence of severe hypertension with concern for hypertensive emergency.  CT imaging was negative for dissection or aneurysm.  Patient admitted to the ICU and started on a Cardene drip for management of hypertensive emergency.  Home medications restarted and Cardene drip was weaned off.  Unclear etiology for back pain, but chest pain workup was negative.

## 2023-07-26 NOTE — Evaluation (Signed)
Occupational Therapy Evaluation Patient Details Name: Quincie Haroon MRN: [id number] DOB: 1966/10/17 Today's Date: 07/26/2023   History of Present Illness 57 year old woman who presented to [facility] ED 7/31 with severe L-sided back/shoulder pain x 1 day, CP. PMHx significant for HTN, Bell's palsy.      Reportedly worst near L shoulder/shoulder blade, worse with deep breaths and radiating down patient's L arm with +paresthesias. 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  On ED arrival, patient was hypertensive to 228/142. Reportedly out of BP medications x 2 days, but has uncontrolled HTN even when taking her medications.   Clinical Impression   This 57 yo female admitted with above presents to acute OT with PLOF of being totally independent with all basic ADLs, IADLs, and taking care of her disabled 39 yo daughter. She currently is greatly limited by pain in left arm and back with movement and thus she needs increased A for all basic ADLs and mobility. Pt in shoulder while supine she points to [facility] joint when arm approaches 90 degrees of shoulder flexion or abduction (while at the same time reporting pain in her back and left elbow). We will continue to follow with recommendation being OPOT once she is able to mobilize on her own.       Recommendations for follow up therapy are one component of a multi-disciplinary discharge planning process, led by the attending physician.  Recommendations may be updated based on patient status, additional functional criteria and insurance authorization.   Assistance Recommended at Discharge Frequent or constant Supervision/Assistance  Patient can return home with the following Two people to help with walking and/or transfers;A lot of help with bathing/dressing/bathroom;Assistance with cooking/housework;Assist for transportation;Help with stairs or ramp for entrance     Functional Status Assessment  Patient has had a recent decline in their functional status and demonstrates the ability to make significant improvements in function in a reasonable and predictable amount of time.  Equipment Recommendations   (TBD)       Precautions / Restrictions Precautions Precautions: Fall Precaution Comments: pain, left shouler and back, Left knee can buckle Restrictions Weight Bearing Restrictions: No             ADL either performed or assessed with clinical judgement   ADL                                         General ADL Comments: Did address this today due to patient reporting her back goes into spasms when she tries to sit up and she is supposed to be getting a different pain me/muscle relaxer and cannot move to get up right now     Vision Patient Visual Report: No change from baseline              Pertinent Vitals/Pain Pain Assessment Pain Assessment: 0-10 Pain Score: 10-Worst pain ever Pain Location: with some movements (ie: left shoulder PROM supine when approaching ~90 degrees shoulder flexion and abduction--points to [facility] joint when asked where pain is; also reports it radiates into her back) Pain Descriptors / Indicators: Discomfort, Grimacing, Sharp Pain Intervention(s): Limited activity within patient's tolerance, Monitored during session, Repositioned, Heat applied     Hand Dominance Right   Extremity/Trunk Assessment Upper Extremity Assessment Upper Extremity Assessment: LUE deficits/detail  LUE Deficits / Details: able to achieve ~90 degrees of shoulder flexion and abduction PROM and SROM before reporting the severe pain (in [facility] joint and radiating into back) LUE Coordination: decreased gross motor           Communication Communication Communication: No difficulties   Cognition Arousal/Alertness: Awake/alert Behavior During Therapy: WFL for tasks assessed/performed Overall Cognitive Status: Within  Functional Limits for tasks assessed                                          Exercises Other Exercises Other Exercises: Encouraged pt to try and do SSROM shoulder flexion to 90 degrees 2 more sessions today 5 reps each while supine in bed. Heating pad wrapped around shoulder from back to front to see if that would help pain.        Home Living Family/patient expects to be discharged to:: Private residence Living Arrangements: Children Available Help at Discharge: Family;Available PRN/intermittently Type of Home: House Home Access: Stairs to enter Entrance Stairs-Number of Steps: 1 big   Home Layout: 1/2 bath on main level Alternate Level Stairs-Number of Steps: 14 Alternate Level Stairs-Rails: Left Bathroom Shower/Tub: Tub/shower unit   Bathroom Toilet: Handicapped height     Home Equipment: Agricultural consultant (2 wheels);Cane - single point   Additional Comments: Pt is caregiver for her disabled 38 yo daughter at home      Prior Functioning/Environment Prior Level of Function : Needs assist       Physical Assist : Mobility (physical) Mobility (physical): Gait;Stairs   Mobility Comments: using SPC, takes 1 step at a time x 1 yr, has not worked recently          OT Problem List: Decreased strength;Decreased range of motion;Pain      OT Treatment/Interventions: Self-care/ADL training;DME and/or AE instruction;Balance training;Patient/family education;Therapeutic exercise    OT Goals(Current goals can be found in the care plan section) Acute Rehab OT Goals Patient Stated Goal: for left arm and back pain to be better OT Goal Formulation: With patient Time For Goal Achievement: 08/09/23 Potential to Achieve Goals: Good  OT Frequency: Min 1X/week       AM-PAC OT "6 Clicks" Daily Activity     Outcome Measure Help from another person eating meals?: None Help from another person taking care of personal grooming?: A Little Help from another person  toileting, which includes using toliet, bedpan, or urinal?: A Lot Help from another person bathing (including washing, rinsing, drying)?: A Lot Help from another person to put on and taking off regular upper body clothing?: A Lot Help from another person to put on and taking off regular lower body clothing?: A Lot 6 Click Score: 15   End of Session Nurse Communication:  (pt c/o back spasms with arm movements at times)  Activity Tolerance: Patient limited by pain Patient left: in bed  OT Visit Diagnosis: Other abnormalities of gait and mobility (R26.89);Muscle weakness (generalized) (M62.81);Pain Pain - part of body:  (left shoulder and into back)                Time: 2376-2831 OT Time Calculation (min): 19 min Charges:  OT General Charges $OT Visit: 1 Visit OT Evaluation $OT Eval Low Complexity: 1 Low Lindon Romp OT Acute Rehabilitation Services Office [phone]    Evette Georges 07/26/2023, 1:06 PM

## 2023-07-26 NOTE — Progress Notes (Signed)
   07/26/23 1603  TOC Brief Assessment  Insurance and Status Reviewed  Patient has primary care physician Yes  Home environment has been reviewed Home  Prior level of function: Independent  Prior/Current Home Services No current home services  Social Determinants of Health Reivew SDOH reviewed no interventions necessary  Readmission risk has been reviewed Yes  Transition of care needs no transition of care needs at this time

## 2023-07-26 NOTE — Plan of Care (Signed)

## 2023-07-26 NOTE — Progress Notes (Signed)
PROGRESS NOTE    Grace Young  UVO:[id number] DOB: 09-Jan-1966 DOA: 07/24/2023 PCP: Claiborne Rigg, NP   Brief Narrative: Grace Young is a 57 y.o. female with a history of primary hypertension, Bell's palsy.  Patient presented secondary to left-sided back pain and was found to have evidence of severe hypertension with concern for hypertensive emergency.  CT imaging was negative for dissection or aneurysm.  Patient admitted to the ICU and started on a Cardene drip for management of hypertensive emergency.  Home medications restarted and Cardene drip was weaned off.  Unclear etiology for back pain, but chest pain workup was negative.   Assessment and Plan:  Hypertensive emergency Present on admission secondary to medication non-adherence after running out of home medications. Patient admitted to the ICU and managed on a Cardene drip with improvement of blood pressure. Home medications restarted. Chest pain workup was negative. Hypertensive emergency resolved.  Back pain Unclear etiology. CTA chest/abdomen/pelvis was negative for aneurysm or dissection. CRP and ESR obtained and are elevated. No injury precipitating symptoms. Pain appears to be localized to the trapezius muscles. -Continue analgesics -Solu-medrol IV x1  Hypokalemia Low of 2.6. Replaced and now resolved.  Hypomagnesemia Low of 1.4. Replaced and now resolved.  Macrocytic anemia Likely secondary to vitamin B12 deficiency. Hemoglobin is stable.  Vitamin B12 deficiency Normal folate. Vitamin B12 level of 115. Patient started on Vitamin B12 supplementation.  Hepatic steatosis Noted on CT imaging. Patient will need outpatient follow-up. Would benefit from weight loss techniques and completely abstaining from alcohol consumption.  DVT prophylaxis: Heparin subcutaneous Code Status:   Code Status: Full Code Family Communication: None at bedside Disposition Plan: Discharge home once able to tolerate mobility with  improvement of back pain   Consultants:  Pulmonary critical care medicine  Procedures:  8/1: Transthoracic echocardiogram  Antimicrobials: None   Subjective: Patient reports continued back pain that prevents her from mobilizing well. No other issues from overnight.  Objective: BP (!) 145/86   Pulse 75   Temp 98.7 F (37.1 C) (Oral)   Resp 12   Ht 5\' 8"  (1.727 m)   Wt 108.9 kg   SpO2 96%   BMI 36.49 kg/m   Examination:  General exam: Appears calm and comfortable Respiratory system: Clear to auscultation. Respiratory effort normal. Cardiovascular system: S1 & S2 heard, RRR. No murmurs, rubs, gallops or clicks. Gastrointestinal system: Abdomen is nondistended, soft and nontender. Normal bowel sounds heard. Central nervous system: Alert and oriented. No focal neurological deficits. Musculoskeletal: No edema. No calf tenderness. Tenderness over trapezius muscle with R>L. No midline tenderness. Skin: No cyanosis. No rashes Psychiatry: Judgement and insight appear normal. Mood & affect appropriate.    Data Reviewed: I have personally reviewed following labs and imaging studies  CBC Lab Results  Component Value Date   WBC 12.9 (H) 07/25/2023   RBC 3.35 (L) 07/25/2023   HGB 12.6 07/25/2023   HCT 37.1 07/25/2023   MCV 110.7 (H) 07/25/2023   MCH 37.6 (H) 07/25/2023   PLT 315 07/25/2023   MCHC 34.0 07/25/2023   RDW 16.5 (H) 07/25/2023   LYMPHSABS 1.4 07/24/2023   MONOABS 0.7 07/24/2023   EOSABS 0.1 07/24/2023   BASOSABS 0.1 07/24/2023     Last metabolic panel Lab Results  Component Value Date   NA 134 (L) 07/25/2023   K 3.6 07/25/2023   CL 99 07/25/2023   CO2 23 07/25/2023   BUN 9 07/25/2023   CREATININE 0.58 07/25/2023   GLUCOSE 173 (H)  07/25/2023   GFRNONAA >60 07/25/2023   GFRAA >60 05/09/2019   CALCIUM 9.3 07/25/2023   PHOS 2.4 (L) 07/25/2023   PROT 8.9 (H) 07/24/2023   ALBUMIN 4.0 07/24/2023   BILITOT 1.1 07/24/2023   ALKPHOS 123 07/24/2023    AST 34 07/24/2023   ALT 21 07/24/2023   ANIONGAP 12 07/25/2023    GFR: Estimated Creatinine Clearance: 100.3 mL/min (by C-G formula based on SCr of 0.58 mg/dL).  Recent Results (from the past 240 hour(s))  MRSA Next Gen by PCR, Nasal     Status: None   Collection Time: 07/24/23 10:13 PM   Specimen: Nasal Mucosa; Nasal Swab  Result Value Ref Range Status   MRSA by PCR Next Gen NOT DETECTED NOT DETECTED Final    Comment: (NOTE) The GeneXpert MRSA Assay (FDA approved for NASAL specimens only), is one component of a comprehensive MRSA colonization surveillance program. It is not intended to diagnose MRSA infection nor to guide or monitor treatment for MRSA infections. Test performance is not FDA approved in patients less than 20 years old. Performed at [facility], [address], [place], [place] [address]       Radiology Studies: ECHOCARDIOGRAM COMPLETE  Result Date: 07/25/2023    ECHOCARDIOGRAM REPORT   Patient Name:   Grace Young Date of Exam: 07/25/2023 Medical Rec #:  [id number]    Height:       68.0 in Accession #:    [id number]   Weight:       240.0 lb Date of Birth:  [birth year]    BSA:          2.208 m Patient Age:    57 years     BP:           164/98 mmHg Patient Gender: F            HR:           86 bpm. Exam Location:  Inpatient Procedure: 2D Echo, Cardiac Doppler, Color Doppler and Intracardiac            Opacification Agent Indications:    Hypertensive emergency  History:        Patient has no prior history of Echocardiogram examinations.                 Risk Factors:Hypertension. Bell's palsy.  Sonographer:    Milda Smart Referring Phys: [id number] STEPHANIE M REESE  Sonographer Comments: Image acquisition challenging due to patient body habitus and Image acquisition challenging due to respiratory motion. IMPRESSIONS  1. Left ventricular ejection fraction, by estimation, is 60 to 65%. The left ventricle has normal function. The left ventricle has no regional  wall motion abnormalities. There is moderate concentric left ventricular hypertrophy. Left ventricular diastolic parameters are indeterminate.  2. Right ventricular systolic function is normal. The right ventricular size is normal.  3. Left atrial size was mild to moderately dilated.  4. The mitral valve is normal in structure. No evidence of mitral valve regurgitation. No evidence of mitral stenosis.  5. The aortic valve is normal in structure. Aortic valve regurgitation is not visualized. No aortic stenosis is present.  6. The inferior vena cava is normal in size with greater than 50% respiratory variability, suggesting right atrial pressure of 3 mmHg. Comparison(s): No prior Echocardiogram. Conclusion(s)/Recommendation(s): Otherwise normal echocardiogram, with minor abnormalities described in the report. FINDINGS  Left Ventricle: Left ventricular ejection fraction, by estimation, is 60 to 65%. The left ventricle has normal function. The  left ventricle has no regional wall motion abnormalities. Definity contrast agent was given IV to delineate the left ventricular  endocardial borders. The left ventricular internal cavity size was normal in size. There is moderate concentric left ventricular hypertrophy. Left ventricular diastolic parameters are indeterminate. Right Ventricle: The right ventricular size is normal. Right vetricular wall thickness was not well visualized. Right ventricular systolic function is normal. Left Atrium: Left atrial size was mild to moderately dilated. Right Atrium: Right atrial size was normal in size. Pericardium: Trivial pericardial effusion is present. Mitral Valve: The mitral valve is normal in structure. No evidence of mitral valve regurgitation. No evidence of mitral valve stenosis. MV peak gradient, 7.1 mmHg. The mean mitral valve gradient is 4.0 mmHg. Tricuspid Valve: The tricuspid valve is normal in structure. Tricuspid valve regurgitation is trivial. No evidence of tricuspid  stenosis. Aortic Valve: The aortic valve is normal in structure. Aortic valve regurgitation is not visualized. No aortic stenosis is present. Pulmonic Valve: The pulmonic valve was grossly normal. Pulmonic valve regurgitation is trivial. No evidence of pulmonic stenosis. Aorta: The aortic root, ascending aorta and aortic arch are all structurally normal, with no evidence of dilitation or obstruction. Venous: The inferior vena cava is normal in size with greater than 50% respiratory variability, suggesting right atrial pressure of 3 mmHg. IAS/Shunts: The atrial septum is grossly normal.  LEFT VENTRICLE PLAX 2D LVIDd:         3.70 cm   Diastology LVIDs:         2.50 cm   LV e' medial:    4.35 cm/s LV PW:         1.30 cm   LV E/e' medial:  21.1 LV IVS:        1.40 cm   LV e' lateral:   3.92 cm/s LVOT diam:     2.10 cm   LV E/e' lateral: 23.4 LV SV:         93 LV SV Index:   42 LVOT Area:     3.46 cm  RIGHT VENTRICLE             IVC RV Basal diam:  3.40 cm     IVC diam: 1.40 cm RV S prime:     16.60 cm/s TAPSE (M-mode): 2.3 cm LEFT ATRIUM             Index        RIGHT ATRIUM           Index LA diam:        3.90 cm 1.77 cm/m   RA Area:     11.20 cm LA Vol (A2C):   70.5 ml 31.92 ml/m  RA Volume:   23.30 ml  10.55 ml/m LA Vol (A4C):   59.1 ml 26.76 ml/m LA Biplane Vol: 65.5 ml 29.66 ml/m  AORTIC VALVE LVOT Vmax:   128.00 cm/s LVOT Vmean:  93.500 cm/s LVOT VTI:    0.268 m  AORTA Ao Root diam: 3.10 cm Ao Asc diam:  3.70 cm MITRAL VALVE MV Area (PHT): 4.01 cm     SHUNTS MV Area VTI:   3.72 cm     Systemic VTI:  0.27 m MV Peak grad:  7.1 mmHg     Systemic Diam: 2.10 cm MV Mean grad:  4.0 mmHg MV Vmax:       1.33 m/s MV Vmean:      93.1 cm/s MV Decel Time: 189 msec MV E velocity: 91.80 cm/s MV A  velocity: 122.00 cm/s MV E/A ratio:  0.75 Jodelle Red MD Electronically signed by Jodelle Red MD Signature Date/Time: 07/25/2023/5:21:25 PM    Final    DG Shoulder Left  Result Date: 07/24/2023 CLINICAL  DATA:  161096 Left shoulder pain [id number] EXAM: LEFT SHOULDER - 2+ VIEW COMPARISON:  None Available. FINDINGS: There is no evidence of fracture or dislocation. Small acromioclavicular spurs. There is no other evidence of arthropathy or other focal bone abnormality. Soft tissues are unremarkable. IMPRESSION: 1. No acute findings. 2. Mild acromioclavicular spurring. Electronically Signed   By: Corlis Leak M.D.   On: 07/24/2023 22:25   CT Angio Chest/Abd/Pel for Dissection W and/or Wo Contrast  Result Date: 07/24/2023 CLINICAL DATA:  Chest pain, hypertension, back pain since yesterday EXAM: CT ANGIOGRAPHY CHEST, ABDOMEN AND PELVIS TECHNIQUE: Non-contrast CT of the chest was initially obtained. Multidetector CT imaging through the chest, abdomen and pelvis was performed using the standard protocol during bolus administration of intravenous contrast. Multiplanar reconstructed images and MIPs were obtained and reviewed to evaluate the vascular anatomy. RADIATION DOSE REDUCTION: This exam was performed according to the departmental dose-optimization program which includes automated exposure control, adjustment of the mA and/or kV according to patient size and/or use of iterative reconstruction technique. CONTRAST:  OMNIPAQUE IOHEXOL 350 MG/ML SOLN COMPARISON:  CT chest angiogram, 07/26/2018 FINDINGS: CTA CHEST FINDINGS VASCULAR Aorta: Satisfactory opacification of the aorta. Normal contour and caliber of the thoracic aorta. No evidence of aneurysm, dissection, or other acute aortic pathology. Cardiovascular: No evidence of pulmonary embolism on limited non-tailored examination. Cardiomegaly. No pericardial effusion. Review of the MIP images confirms the above findings. NON VASCULAR Mediastinum/Nodes: No enlarged mediastinal, hilar, or axillary lymph nodes. Small hiatal hernia. Thyroid gland, trachea, and esophagus demonstrate no significant findings. Lungs/Pleura: Extensive dependent bibasilar scarring or  atelectasis, with acutely superimposed heterogeneous and ground-glass airspace disease (series 6, image 89). No pleural effusion or pneumothorax. Musculoskeletal: No chest wall abnormality. No acute osseous findings. Review of the MIP images confirms the above findings. CTA ABDOMEN AND PELVIS FINDINGS VASCULAR Normal contour and caliber of the abdominal aorta. No evidence of aneurysm, dissection, or other acute aortic pathology. Standard branching pattern of the abdominal aorta with solitary bilateral renal arteries. Mild aortic atherosclerosis Review of the MIP images confirms the above findings. NON-VASCULAR Hepatobiliary: No solid liver abnormality is seen. Hepatic steatosis. No gallstones, gallbladder wall thickening, or biliary dilatation. Pancreas: Unremarkable. No pancreatic ductal dilatation or surrounding inflammatory changes. Spleen: Normal in size without significant abnormality. Adrenals/Urinary Tract: Adrenal glands are unremarkable. Simple, benign renal cortical cysts, for which no further follow-up or characterization is required. Kidneys are otherwise normal, without renal calculi, solid lesion, or hydronephrosis. Bladder is unremarkable. Stomach/Bowel: Stomach is within normal limits. Appendix not clearly visualized. No evidence of bowel wall thickening, distention, or inflammatory changes. Lymphatic: No enlarged abdominal or pelvic lymph nodes. Reproductive: No mass or other significant abnormality. Other: No abdominal wall hernia or abnormality. No ascites. Musculoskeletal: No acute osseous findings. IMPRESSION: 1. Normal contour and caliber of the thoracic and abdominal aorta. No evidence of aneurysm, dissection, or other acute aortic pathology. Mild abdominal aortic atherosclerosis. 2. Extensive dependent bibasilar scarring or atelectasis, with acutely superimposed heterogeneous and ground-glass airspace disease, consistent with infection or aspiration. 3. Cardiomegaly. 4. Hepatic steatosis. 5.  Small hiatal hernia. Aortic Atherosclerosis (ICD10-I70.0). Electronically Signed   By: Jearld Lesch M.D.   On: 07/24/2023 15:56   DG Chest 2 View  Result Date: 07/24/2023 CLINICAL DATA:  Chest  pain EXAM: CHEST - 2 VIEW COMPARISON:  X-ray 10/14/2018 FINDINGS: Linear opacity at the bases likely scar or atelectasis. No consolidation, pneumothorax or effusion. No edema. Normal cardiopericardial silhouette. Degenerative changes are seen along the spine. IMPRESSION: Basilar scar or atelectasis. Electronically Signed   By: Karen Kays M.D.   On: 07/24/2023 13:57      LOS: 2 days    Jacquelin Hawking, MD [facility] 07/26/2023, 10:05 AM   If 7PM-7AM, please contact night-coverage [email or web address]

## 2023-07-27 DIAGNOSIS — I161 Hypertensive emergency: Secondary | ICD-10-CM | POA: Diagnosis not present

## 2023-07-27 LAB — CBC
HCT: 34.9 % — ABNORMAL LOW (ref 36.0–46.0)
Hemoglobin: 11.3 g/dL — ABNORMAL LOW (ref 12.0–15.0)
MCH: 36.7 pg — ABNORMAL HIGH (ref 26.0–34.0)
MCHC: 32.4 g/dL (ref 30.0–36.0)
MCV: 113.3 fL — ABNORMAL HIGH (ref 80.0–100.0)
Platelets: 336 10*3/uL (ref 150–400)
RBC: 3.08 MIL/uL — ABNORMAL LOW (ref 3.87–5.11)
RDW: 17.2 % — ABNORMAL HIGH (ref 11.5–15.5)
WBC: 12.8 10*3/uL — ABNORMAL HIGH (ref 4.0–10.5)
nRBC: 0.2 % (ref 0.0–0.2)

## 2023-07-27 LAB — RENAL FUNCTION PANEL
Albumin: 3.2 g/dL — ABNORMAL LOW (ref 3.5–5.0)
Anion gap: 10 (ref 5–15)
BUN: 24 mg/dL — ABNORMAL HIGH (ref 6–20)
CO2: 26 mmol/L (ref 22–32)
Calcium: 9.4 mg/dL (ref 8.9–10.3)
Chloride: 105 mmol/L (ref 98–111)
Creatinine, Ser: 0.85 mg/dL (ref 0.44–1.00)
GFR, Estimated: >60 mL/min (ref >60)
Glucose, Bld: 106 mg/dL — ABNORMAL HIGH (ref 70–99)
Phosphorus: 2.5 mg/dL (ref 2.5–4.6)
Potassium: 3.7 mmol/L (ref 3.5–5.1)
Sodium: 141 mmol/L (ref 135–145)

## 2023-07-27 LAB — URIC ACID: Uric Acid, Serum: 9.4 mg/dL — ABNORMAL HIGH (ref 2.5–7.1)

## 2023-07-27 LAB — CK: Total CK: 28 U/L — ABNORMAL LOW (ref 38–234)

## 2023-07-27 LAB — TSH: TSH: 0.526 u[IU]/mL (ref 0.350–4.500)

## 2023-07-27 MED ORDER — CYCLOBENZAPRINE HCL 10 MG PO TABS
10.0000 mg | ORAL_TABLET | Freq: Three times a day (TID) | ORAL | Status: DC
  Administered 2023-07-27 – 2023-07-29 (×7): 10 mg via ORAL
  Filled 2023-07-27 (×7): qty 1

## 2023-07-27 MED ORDER — PREDNISONE 5 MG PO TABS: 15.0000 mg | ORAL_TABLET | Freq: Every day | ORAL | Status: DC

## 2023-07-27 MED ORDER — PREDNISONE 5 MG PO TABS
15.0000 mg | ORAL_TABLET | Freq: Every day | ORAL | Status: DC
  Administered 2023-07-27 – 2023-07-29 (×3): 15 mg via ORAL
  Filled 2023-07-27 (×3): qty 3

## 2023-07-27 NOTE — Progress Notes (Signed)
Occupational Therapy Treatment Patient Details Name: Grace Young MRN: [id number] DOB: 1966/04/06 Today's Date: 07/27/2023   History of present illness 57 year old woman who presented to [facility] ED 7/31 with severe L-sided back/shoulder pain x 1 day, CP. PMHx significant for HTN, Bell's palsy.      Reportedly worst near L shoulder/shoulder blade, worse with deep breaths and radiating down patient's L arm with +paresthesias. 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  On ED arrival, patient was hypertensive to 228/142. Reportedly out of BP medications x 2 days, but has uncontrolled HTN even when taking her medications.   OT comments  This 57 yo female seen today for a shower to see if this would help with her shoulder and back pain. Pt reported that shower felt really good  but still with what she describes as spasm pain in upper to mid back (middle). She was able to get up and ambulate with RW into shower, shower, and back out to recliner today. She will continue to benefit from acute OT with follow OPOT or OPPT recommended.for left shoulder/back.   Recommendations for follow up therapy are one component of a multi-disciplinary discharge planning process, led by the attending physician.  Recommendations may be updated based on patient status, additional functional criteria and insurance authorization.    Assistance Recommended at Discharge Set up Supervision/Assistance  Patient can return home with the following  A little help with walking and/or transfers;A little help with bathing/dressing/bathroom;Assist for transportation;Assistance with cooking/housework;Help with stairs or ramp for entrance   Equipment Recommendations  Tub/shower seat;BSC/3in1       Precautions / Restrictions Precautions Precautions: Fall Precaution Comments: pain, left shouler and back, Left knee can  buckle Restrictions Weight Bearing Restrictions: No       Mobility Bed Mobility Overal bed mobility: Modified Independent (increased time, HOB up)                  Transfers Overall transfer level: Needs assistance Equipment used: Rolling walker (2 wheels) Transfers: Sit to/from Stand Sit to Stand: Supervision           General transfer comment: increased time     Balance Overall balance assessment: Mild deficits observed, not formally tested Sitting-balance support: No upper extremity supported, Feet supported Sitting balance-Leahy Scale: Good     Standing balance support: Single extremity supported Standing balance-Leahy Scale: Poor Standing balance comment: reliant on RW  for balance intermittently                           ADL either performed or assessed with clinical judgement   ADL                                         General ADL Comments: setup with intermittent S for showering seated on 3n1 in shower stall  as well as to get dressed (underwear and socks) seated and sit<>stand in recliner in room    Extremity/Trunk Assessment Upper Extremity Assessment Upper Extremity Assessment: LUE deficits/detail LUE Deficits / Details: moving maybe a little better today with functional activity LUE Coordination: decreased gross motor            Vision Patient Visual Report: No change from baseline  Cognition Arousal/Alertness: Awake/alert Behavior During Therapy: WFL for tasks assessed/performed Overall Cognitive Status: Within Functional Limits for tasks assessed                                                     Pertinent Vitals/ Pain       Pain Assessment Pain Assessment: 0-10 Pain Score: 8  Pain Location: more middle of mid/upper back today Pain Descriptors / Indicators: Discomfort, Grimacing, Spasm Pain Intervention(s): Monitored during session, Premedicated before session (had  pt take hot shower)         Frequency  Min 1X/week        Progress Toward Goals  OT Goals(current goals can now be found in the care plan section)  Progress towards OT goals: Progressing toward goals  Acute Rehab OT Goals Patient Stated Goal: for pain to get better OT Goal Formulation: With patient Time For Goal Achievement: 08/09/23 Potential to Achieve Goals: Good  Plan Discharge plan remains appropriate       AM-PAC OT "6 Clicks" Daily Activity     Outcome Measure   Help from another person eating meals?: None Help from another person taking care of personal grooming?: A Little Help from another person toileting, which includes using toliet, bedpan, or urinal?: A Little Help from another person bathing (including washing, rinsing, drying)?: A Little Help from another person to put on and taking off regular upper body clothing?: A Little Help from another person to put on and taking off regular lower body clothing?: A Little 6 Click Score: 19    End of Session Equipment Utilized During Treatment: Rolling walker (2 wheels)  OT Visit Diagnosis: Other abnormalities of gait and mobility (R26.89);Muscle weakness (generalized) (M62.81);Pain Pain - part of body:  (mid to lower back in middle)   Activity Tolerance Patient tolerated treatment well (but still reports very painful)   Patient Left in chair;with call bell/phone within reach   Nurse Communication Mobility status        Time: 9604-5409 OT Time Calculation (min): 44 min  Charges: OT General Charges $OT Visit: 1 Visit OT Treatments $Self Care/Home Management : 38-52 mins  Lindon Romp OT Acute Rehabilitation Services Office [phone]    Evette Georges 07/27/2023, 10:25 PM

## 2023-07-27 NOTE — Progress Notes (Signed)
PROGRESS NOTE    [name]  ATF:[id number] DOB: October 02, 1966 DOA: 07/24/2023 PCP: Claiborne Rigg, NP   Brief Narrative: Grace Young is a 57 y.o. female with a history of primary hypertension, Bell's palsy.  Patient presented secondary to left-sided back pain and was found to have evidence of severe hypertension with concern for hypertensive emergency.  CT imaging was negative for dissection or aneurysm.  Patient admitted to the ICU and started on a Cardene drip for management of hypertensive emergency.  Home medications restarted and Cardene drip was weaned off.  Unclear etiology for back pain, but chest pain workup was negative.   Assessment and Plan:  Hypertensive emergency Present on admission secondary to medication non-adherence after running out of home medications. Patient admitted to the ICU and managed on a Cardene drip with improvement of blood pressure. Home medications restarted. Chest pain workup was negative. Hypertensive emergency resolved. -Continue amlodipine, Coreg, lisinopril  Back pain Unclear etiology. CTA chest/abdomen/pelvis was negative for aneurysm or dissection. CRP and ESR obtained and are elevated. No injury precipitating symptoms. Pain appears to be localized to the trapezius muscles. Some mild improvement from admission -Continue analgesics; start Flexeril  -Check CK, TSH, CCP, RF -Trial prednisone 15 mg daily -Continue physical therapy  Hypokalemia Low of 2.6. Replaced and now resolved.  Hypomagnesemia Low of 1.4. Replaced and now resolved.  Macrocytic anemia Likely secondary to vitamin B12 deficiency. Hemoglobin is stable.  Vitamin B12 deficiency Normal folate. Vitamin B12 level of 115. Patient started on Vitamin B12 supplementation. -Continue Vitamin B12  Hepatic steatosis Noted on CT imaging. Patient will need outpatient follow-up. Would benefit from weight loss techniques and completely abstaining from alcohol consumption.  DVT  prophylaxis: Heparin subcutaneous Code Status:   Code Status: Full Code Family Communication: None at bedside Disposition Plan: Discharge home once able to tolerate mobility with improvement of back pain; possibly home in 1-2 days   Consultants:  Pulmonary critical care medicine  Procedures:  8/1: Transthoracic echocardiogram  Antimicrobials: None   Subjective: Continued back pain. Pain is minimally improved but mobility is significantly limited with pain  Objective: BP (!) 181/95 (BP Location: Right Arm)   Pulse 84   Temp 97.9 F (36.6 C) (Oral)   Resp 18   Ht 5\' 8"  (1.727 m)   Wt 108.1 kg   SpO2 95%   BMI 36.24 kg/m   Examination:  General exam: Appears calm and comfortable Respiratory system: Clear to auscultation. Respiratory effort normal. Cardiovascular system: S1 & S2 heard, Normal rate with regular rhythm. Gastrointestinal system: Abdomen is nondistended, soft and nontender. Normal bowel sounds heard. Central nervous system: Alert and oriented. No focal neurological deficits. Musculoskeletal: No edema. Skin: No cyanosis. No rashes Psychiatry: Judgement and insight appear normal. Mood & affect appropriate.     Data Reviewed: I have personally reviewed following labs and imaging studies  CBC Lab Results  Component Value Date   WBC 12.8 (H) 07/27/2023   RBC 3.08 (L) 07/27/2023   HGB 11.3 (L) 07/27/2023   HCT 34.9 (L) 07/27/2023   MCV 113.3 (H) 07/27/2023   MCH 36.7 (H) 07/27/2023   PLT 336 07/27/2023   MCHC 32.4 07/27/2023   RDW 17.2 (H) 07/27/2023   LYMPHSABS 1.4 07/24/2023   MONOABS 0.7 07/24/2023   EOSABS 0.1 07/24/2023   BASOSABS 0.1 07/24/2023     Last metabolic panel Lab Results  Component Value Date   NA 141 07/27/2023   K 3.7 07/27/2023   CL 105 07/27/2023  CO2 26 07/27/2023   BUN 24 (H) 07/27/2023   CREATININE 0.85 07/27/2023   GLUCOSE 106 (H) 07/27/2023   GFRNONAA >60 07/27/2023   GFRAA >60 05/09/2019   CALCIUM 9.4 07/27/2023    PHOS 2.5 07/27/2023   PROT 8.9 (H) 07/24/2023   ALBUMIN 3.2 (L) 07/27/2023   BILITOT 1.1 07/24/2023   ALKPHOS 123 07/24/2023   AST 34 07/24/2023   ALT 21 07/24/2023   ANIONGAP 10 07/27/2023    GFR: Estimated Creatinine Clearance: 94.1 mL/min (by C-G formula based on SCr of 0.85 mg/dL).  Recent Results (from the past 240 hour(s))  MRSA Next Gen by PCR, Nasal     Status: None   Collection Time: 07/24/23 10:13 PM   Specimen: Nasal Mucosa; Nasal Swab  Result Value Ref Range Status   MRSA by PCR Next Gen NOT DETECTED NOT DETECTED Final    Comment: (NOTE) The GeneXpert MRSA Assay (FDA approved for NASAL specimens only), is one component of a comprehensive MRSA colonization surveillance program. It is not intended to diagnose MRSA infection nor to guide or monitor treatment for MRSA infections. Test performance is not FDA approved in patients less than 26 years old. Performed at [facility], [address], [place], [place] [address]       Radiology Studies: DG Chest 2 View  Result Date: 07/26/2023 CLINICAL DATA:  Back pain. EXAM: CHEST - 2 VIEW COMPARISON:  July 24, 2023. FINDINGS: Stable cardiomediastinal silhouette. Stable bibasilar subsegmental atelectasis or scarring. Bony thorax is unremarkable. IMPRESSION: Stable bibasilar subsegmental atelectasis or scarring. Electronically Signed   By: Lupita Raider M.D.   On: 07/26/2023 14:10   ECHOCARDIOGRAM COMPLETE  Result Date: 07/25/2023    ECHOCARDIOGRAM REPORT   Patient Name:   Grace Young Date of Exam: 07/25/2023 Medical Rec #:  [id number]    Height:       68.0 in Accession #:    [id number]   Weight:       240.0 lb Date of Birth:  [birth year]    BSA:          2.208 m Patient Age:    57 years     BP:           164/98 mmHg Patient Gender: F            HR:           86 bpm. Exam Location:  Inpatient Procedure: 2D Echo, Cardiac Doppler, Color Doppler and Intracardiac            Opacification Agent Indications:     Hypertensive emergency  History:        Patient has no prior history of Echocardiogram examinations.                 Risk Factors:Hypertension. Bell's palsy.  Sonographer:    Milda Smart Referring Phys: [id number] STEPHANIE M REESE  Sonographer Comments: Image acquisition challenging due to patient body habitus and Image acquisition challenging due to respiratory motion. IMPRESSIONS  1. Left ventricular ejection fraction, by estimation, is 60 to 65%. The left ventricle has normal function. The left ventricle has no regional wall motion abnormalities. There is moderate concentric left ventricular hypertrophy. Left ventricular diastolic parameters are indeterminate.  2. Right ventricular systolic function is normal. The right ventricular size is normal.  3. Left atrial size was mild to moderately dilated.  4. The mitral valve is normal in structure. No evidence of mitral valve regurgitation. No evidence of mitral  stenosis.  5. The aortic valve is normal in structure. Aortic valve regurgitation is not visualized. No aortic stenosis is present.  6. The inferior vena cava is normal in size with greater than 50% respiratory variability, suggesting right atrial pressure of 3 mmHg. Comparison(s): No prior Echocardiogram. Conclusion(s)/Recommendation(s): Otherwise normal echocardiogram, with minor abnormalities described in the report. FINDINGS  Left Ventricle: Left ventricular ejection fraction, by estimation, is 60 to 65%. The left ventricle has normal function. The left ventricle has no regional wall motion abnormalities. Definity contrast agent was given IV to delineate the left ventricular  endocardial borders. The left ventricular internal cavity size was normal in size. There is moderate concentric left ventricular hypertrophy. Left ventricular diastolic parameters are indeterminate. Right Ventricle: The right ventricular size is normal. Right vetricular wall thickness was not well visualized. Right ventricular  systolic function is normal. Left Atrium: Left atrial size was mild to moderately dilated. Right Atrium: Right atrial size was normal in size. Pericardium: Trivial pericardial effusion is present. Mitral Valve: The mitral valve is normal in structure. No evidence of mitral valve regurgitation. No evidence of mitral valve stenosis. MV peak gradient, 7.1 mmHg. The mean mitral valve gradient is 4.0 mmHg. Tricuspid Valve: The tricuspid valve is normal in structure. Tricuspid valve regurgitation is trivial. No evidence of tricuspid stenosis. Aortic Valve: The aortic valve is normal in structure. Aortic valve regurgitation is not visualized. No aortic stenosis is present. Pulmonic Valve: The pulmonic valve was grossly normal. Pulmonic valve regurgitation is trivial. No evidence of pulmonic stenosis. Aorta: The aortic root, ascending aorta and aortic arch are all structurally normal, with no evidence of dilitation or obstruction. Venous: The inferior vena cava is normal in size with greater than 50% respiratory variability, suggesting right atrial pressure of 3 mmHg. IAS/Shunts: The atrial septum is grossly normal.  LEFT VENTRICLE PLAX 2D LVIDd:         3.70 cm   Diastology LVIDs:         2.50 cm   LV e' medial:    4.35 cm/s LV PW:         1.30 cm   LV E/e' medial:  21.1 LV IVS:        1.40 cm   LV e' lateral:   3.92 cm/s LVOT diam:     2.10 cm   LV E/e' lateral: 23.4 LV SV:         93 LV SV Index:   42 LVOT Area:     3.46 cm  RIGHT VENTRICLE             IVC RV Basal diam:  3.40 cm     IVC diam: 1.40 cm RV S prime:     16.60 cm/s TAPSE (M-mode): 2.3 cm LEFT ATRIUM             Index        RIGHT ATRIUM           Index LA diam:        3.90 cm 1.77 cm/m   RA Area:     11.20 cm LA Vol (A2C):   70.5 ml 31.92 ml/m  RA Volume:   23.30 ml  10.55 ml/m LA Vol (A4C):   59.1 ml 26.76 ml/m LA Biplane Vol: 65.5 ml 29.66 ml/m  AORTIC VALVE LVOT Vmax:   128.00 cm/s LVOT Vmean:  93.500 cm/s LVOT VTI:    0.268 m  AORTA Ao Root diam:  3.10 cm Ao Asc diam:  3.70 cm MITRAL VALVE MV Area (PHT): 4.01 cm     SHUNTS MV Area VTI:   3.72 cm     Systemic VTI:  0.27 m MV Peak grad:  7.1 mmHg     Systemic Diam: 2.10 cm MV Mean grad:  4.0 mmHg MV Vmax:       1.33 m/s MV Vmean:      93.1 cm/s MV Decel Time: 189 msec MV E velocity: 91.80 cm/s MV A velocity: 122.00 cm/s MV E/A ratio:  0.75 Jodelle Red MD Electronically signed by Jodelle Red MD Signature Date/Time: 07/25/2023/5:21:25 PM    Final       LOS: 3 days    Jacquelin Hawking, MD [facility] 07/27/2023, 1:26 PM   If 7PM-7AM, please contact night-coverage [email or web address]

## 2023-07-27 NOTE — Plan of Care (Signed)
  Problem: Pain Managment: Goal: General experience of comfort will improve Outcome: Progressing   Problem: Education: Goal: Knowledge of General Education information will improve Description: Including pain rating scale, medication(s)/side effects and non-pharmacologic comfort measures Outcome: Adequate for Discharge   Problem: Health Behavior/Discharge Planning: Goal: Ability to manage health-related needs will improve Outcome: Adequate for Discharge   Problem: Clinical Measurements: Goal: Ability to maintain clinical measurements within normal limits will improve Outcome: Adequate for Discharge Goal: Will remain free from infection Outcome: Adequate for Discharge Goal: Diagnostic test results will improve Outcome: Adequate for Discharge Goal: Respiratory complications will improve Outcome: Adequate for Discharge Goal: Cardiovascular complication will be avoided Outcome: Adequate for Discharge   Problem: Activity: Goal: Risk for activity intolerance will decrease Outcome: Adequate for Discharge   Problem: Nutrition: Goal: Adequate nutrition will be maintained Outcome: Adequate for Discharge   Problem: Coping: Goal: Level of anxiety will decrease Outcome: Adequate for Discharge   Problem: Elimination: Goal: Will not experience complications related to bowel motility Outcome: Adequate for Discharge Goal: Will not experience complications related to urinary retention Outcome: Adequate for Discharge   Problem: Safety: Goal: Ability to remain free from injury will improve Outcome: Adequate for Discharge   Problem: Skin Integrity: Goal: Risk for impaired skin integrity will decrease Outcome: Adequate for Discharge

## 2023-07-28 DIAGNOSIS — I161 Hypertensive emergency: Secondary | ICD-10-CM | POA: Diagnosis not present

## 2023-07-28 MED ORDER — LOSARTAN POTASSIUM 50 MG PO TABS
50.0000 mg | ORAL_TABLET | Freq: Every day | ORAL | Status: DC
  Administered 2023-07-29: 50 mg via ORAL
  Filled 2023-07-28: qty 1

## 2023-07-28 MED ORDER — CARVEDILOL 12.5 MG PO TABS
12.5000 mg | ORAL_TABLET | Freq: Two times a day (BID) | ORAL | Status: DC
  Administered 2023-07-28 – 2023-07-29 (×2): 12.5 mg via ORAL
  Filled 2023-07-28 (×2): qty 1

## 2023-07-28 MED ORDER — MELATONIN 3 MG PO TABS
3.0000 mg | ORAL_TABLET | Freq: Every evening | ORAL | Status: DC | PRN
  Administered 2023-07-28: 3 mg via ORAL
  Filled 2023-07-28: qty 1

## 2023-07-28 MED ORDER — LOSARTAN POTASSIUM 50 MG PO TABS: 25.0000 mg | ORAL_TABLET | Freq: Every day | ORAL | Status: DC

## 2023-07-28 MED ORDER — LOSARTAN POTASSIUM 50 MG PO TABS: 50.0000 mg | ORAL_TABLET | Freq: Every day | ORAL | Status: DC

## 2023-07-28 NOTE — Plan of Care (Signed)

## 2023-07-28 NOTE — Progress Notes (Signed)
   07/28/23 1003  Orthostatic Lying   BP- Lying (!) 160/99  Pulse- Lying 91  Orthostatic Sitting  BP- Sitting (!) 160/110  Pulse- Sitting 105  Orthostatic Standing at 0 minutes  BP- Standing at 0 minutes (!) 164/112  Pulse- Standing at 0 minutes 110  Orthostatic Standing at 3 minutes  BP- Standing at 3 minutes (!) 168/112  Pulse- Standing at 3 minutes 113

## 2023-07-28 NOTE — Progress Notes (Signed)
PROGRESS NOTE    Grace Young  ZOX:[id number] DOB: Jul 02, 1966 DOA: 07/24/2023 PCP: Claiborne Rigg, NP   Brief Narrative: Grace Young is a 57 y.o. female with a history of primary hypertension, Bell's palsy.  Patient presented secondary to left-sided back pain and was found to have evidence of severe hypertension with concern for hypertensive emergency.  CT imaging was negative for dissection or aneurysm.  Patient admitted to the ICU and started on a Cardene drip for management of hypertensive emergency.  Home medications restarted and Cardene drip was weaned off.  Unclear etiology for back pain, but chest pain workup was negative.   Assessment and Plan:  Hypertensive emergency Present on admission secondary to medication non-adherence after running out of home medications. Patient admitted to the ICU and managed on a Cardene drip with improvement of blood pressure. Home medications restarted. Chest pain workup was negative. Hypertensive emergency resolved. -Continue amlodipine, Coreg, lisinopril  Back pain Unclear etiology. CTA chest/abdomen/pelvis was negative for aneurysm or dissection. CRP and ESR obtained and are elevated. No injury precipitating symptoms. Pain appears to be localized to the trapezius muscles. Some mild improvement from admission. CK low, TSH normal. -Continue analgesics and Flexeril  -Follow-up CCP, RF -Trial prednisone 15 mg daily -Continue physical therapy  Dizziness Occurs with mobility. Possibly related to hypertension vs possible orthostatic symptoms. Symptoms affecting ability to mobilize -Orthostatic vitals -Better BP control  Hypokalemia Low of 2.6. Replaced and now resolved.  Hypomagnesemia Low of 1.4. Replaced and now resolved.  Macrocytic anemia Likely secondary to vitamin B12 deficiency. Hemoglobin is stable.  Vitamin B12 deficiency Normal folate. Vitamin B12 level of 115. Patient started on Vitamin B12 supplementation. -Continue Vitamin  B12  Hepatic steatosis Noted on CT imaging. Patient will need outpatient follow-up. Would benefit from weight loss techniques and completely abstaining from alcohol consumption.   DVT prophylaxis: Heparin subcutaneous Code Status:   Code Status: Full Code Family Communication: None at bedside Disposition Plan: Discharge home once able to tolerate mobility with improvement of back pain; possibly home in 1 day   Consultants:  Pulmonary critical care medicine  Procedures:  8/1: Transthoracic echocardiogram  Antimicrobials: None   Subjective: Back pain is more manageable. Patient reports dizziness with mobility.   Objective: BP (!) 161/99 (BP Location: Right Arm)   Pulse 83   Temp 98.3 F (36.8 C) (Oral)   Resp 18   Ht 5\' 8"  (1.727 m)   Wt 108.5 kg   SpO2 94%   BMI 36.37 kg/m   Examination:  General exam: Appears calm and comfortable Respiratory system: Respiratory effort normal. Central nervous system: Alert and oriented. No focal neurological deficits. Psychiatry: Judgement and insight appear normal. Mood & affect appropriate.    Data Reviewed: I have personally reviewed following labs and imaging studies  CBC Lab Results  Component Value Date   WBC 12.8 (H) 07/27/2023   RBC 3.08 (L) 07/27/2023   HGB 11.3 (L) 07/27/2023   HCT 34.9 (L) 07/27/2023   MCV 113.3 (H) 07/27/2023   MCH 36.7 (H) 07/27/2023   PLT 336 07/27/2023   MCHC 32.4 07/27/2023   RDW 17.2 (H) 07/27/2023   LYMPHSABS 1.4 07/24/2023   MONOABS 0.7 07/24/2023   EOSABS 0.1 07/24/2023   BASOSABS 0.1 07/24/2023     Last metabolic panel Lab Results  Component Value Date   NA 141 07/27/2023   K 3.7 07/27/2023   CL 105 07/27/2023   CO2 26 07/27/2023   BUN 24 (H) 07/27/2023  CREATININE 0.85 07/27/2023   GLUCOSE 106 (H) 07/27/2023   GFRNONAA >60 07/27/2023   GFRAA >60 05/09/2019   CALCIUM 9.4 07/27/2023   PHOS 2.5 07/27/2023   PROT 8.9 (H) 07/24/2023   ALBUMIN 3.2 (L) 07/27/2023    BILITOT 1.1 07/24/2023   ALKPHOS 123 07/24/2023   AST 34 07/24/2023   ALT 21 07/24/2023   ANIONGAP 10 07/27/2023    GFR: Estimated Creatinine Clearance: 94.2 mL/min (by C-G formula based on SCr of 0.85 mg/dL).  Recent Results (from the past 240 hour(s))  MRSA Next Gen by PCR, Nasal     Status: None   Collection Time: 07/24/23 10:13 PM   Specimen: Nasal Mucosa; Nasal Swab  Result Value Ref Range Status   MRSA by PCR Next Gen NOT DETECTED NOT DETECTED Final    Comment: (NOTE) The GeneXpert MRSA Assay (FDA approved for NASAL specimens only), is one component of a comprehensive MRSA colonization surveillance program. It is not intended to diagnose MRSA infection nor to guide or monitor treatment for MRSA infections. Test performance is not FDA approved in patients less than 46 years old. Performed at [facility], [address], [place], [place] [address]       Radiology Studies: No results found.    LOS: 4 days    Jacquelin Hawking, MD [facility] 07/28/2023, 1:28 PM   If 7PM-7AM, please contact night-coverage [email or web address]

## 2023-07-28 NOTE — Plan of Care (Signed)
  Problem: Pain Managment: Goal: General experience of comfort will improve Outcome: Progressing   Problem: Education: Goal: Knowledge of General Education information will improve Description: Including pain rating scale, medication(s)/side effects and non-pharmacologic comfort measures Outcome: Adequate for Discharge   Problem: Health Behavior/Discharge Planning: Goal: Ability to manage health-related needs will improve Outcome: Adequate for Discharge   Problem: Clinical Measurements: Goal: Ability to maintain clinical measurements within normal limits will improve Outcome: Adequate for Discharge Goal: Will remain free from infection Outcome: Adequate for Discharge Goal: Diagnostic test results will improve Outcome: Adequate for Discharge Goal: Respiratory complications will improve Outcome: Adequate for Discharge Goal: Cardiovascular complication will be avoided Outcome: Adequate for Discharge   Problem: Activity: Goal: Risk for activity intolerance will decrease Outcome: Adequate for Discharge   Problem: Nutrition: Goal: Adequate nutrition will be maintained Outcome: Adequate for Discharge   Problem: Coping: Goal: Level of anxiety will decrease Outcome: Adequate for Discharge   Problem: Elimination: Goal: Will not experience complications related to bowel motility Outcome: Adequate for Discharge Goal: Will not experience complications related to urinary retention Outcome: Adequate for Discharge   Problem: Safety: Goal: Ability to remain free from injury will improve Outcome: Adequate for Discharge   Problem: Skin Integrity: Goal: Risk for impaired skin integrity will decrease Outcome: Adequate for Discharge

## 2023-07-29 DIAGNOSIS — D539 Nutritional anemia, unspecified: Secondary | ICD-10-CM | POA: Insufficient documentation

## 2023-07-29 DIAGNOSIS — I161 Hypertensive emergency: Secondary | ICD-10-CM | POA: Diagnosis not present

## 2023-07-29 DIAGNOSIS — M549 Dorsalgia, unspecified: Secondary | ICD-10-CM | POA: Insufficient documentation

## 2023-07-29 DIAGNOSIS — E538 Deficiency of other specified B group vitamins: Secondary | ICD-10-CM | POA: Insufficient documentation

## 2023-07-29 LAB — RHEUMATOID FACTOR: Rheumatoid fact SerPl-aCnc: 12.8 [IU]/mL (ref <14.0)

## 2023-07-29 LAB — CYCLIC CITRUL PEPTIDE ANTIBODY, IGG/IGA: CCP Antibodies IgG/IgA: 12 U (ref 0–19)

## 2023-07-29 MED ORDER — AMLODIPINE BESYLATE 10 MG PO TABS: 10.0000 mg | ORAL_TABLET | Freq: Every day | ORAL | 0 refills | Status: DC

## 2023-07-29 MED ORDER — LOSARTAN POTASSIUM 50 MG PO TABS: 50.0000 mg | ORAL_TABLET | Freq: Every day | ORAL | 0 refills | Status: DC

## 2023-07-29 MED ORDER — MELATONIN 3 MG PO TABS: 3.0000 mg | ORAL_TABLET | Freq: Every evening | ORAL | 0 refills | Status: AC | PRN

## 2023-07-29 MED ORDER — CARVEDILOL 12.5 MG PO TABS: 12.5000 mg | ORAL_TABLET | Freq: Two times a day (BID) | ORAL | 0 refills | Status: DC

## 2023-07-29 MED ORDER — PREDNISONE 5 MG PO TABS: 15.0000 mg | ORAL_TABLET | Freq: Every day | ORAL | 0 refills | Status: AC

## 2023-07-29 MED ORDER — CYCLOBENZAPRINE HCL 10 MG PO TABS: 10.0000 mg | ORAL_TABLET | Freq: Three times a day (TID) | ORAL | 0 refills | Status: AC | PRN

## 2023-07-29 MED ORDER — CYANOCOBALAMIN 1000 MCG PO TABS: 1000.0000 ug | ORAL_TABLET | Freq: Every day | ORAL | 0 refills | Status: AC

## 2023-07-29 NOTE — Discharge Instructions (Addendum)
Grace Young,  You were in the hospital because of significantly elevated blood pressure in addition to back pain.  Thankfully, your workup for back pain revealed no emergent issues.  Your blood pressure was managed on new blood pressure medications.  Regarding your back pain, you were started on medications to try and help with your pain symptoms.  1 of these medications is a steroid, as you had high inflammatory markers.  Please follow-up with your primary care physician to follow-up on labs in addition to following up on response to therapy with your steroid treatment.  Please be sure to check your blood pressure daily with a blood pressure cuff.

## 2023-07-29 NOTE — TOC Transition Note (Signed)
Transition of Care [facility]) - CM/SW Discharge Note   Patient Details  Name: Grace Young MRN: [id number] Date of Birth: 1966-11-06  [facility] CM/SW Contact:  Harriett Sine, RN Phone Number:[phone]  07/29/2023, 10:28 AM   Clinical Narrative:    Spoke with pt at bedside about d/c to home with DME. Equipment orders called in to [place] with [employer], 3-1, tub bench and quad cane. Transportation to home provided by family.        Patient Goals and CMS Choice      Discharge Placement                         Discharge Plan and Services Additional resources added to the After Visit Summary for                                       Social Determinants of Health (SDOH) Interventions SDOH Screenings   Food Insecurity: No Food Insecurity (07/26/2023)  Housing: Patient Declined (07/26/2023)  Transportation Needs: No Transportation Needs (07/26/2023)  Utilities: Not At Risk (07/24/2023)  Depression (PHQ2-9): Low Risk  (06/02/2019)  Tobacco Use: Low Risk  (07/24/2023)     Readmission Risk Interventions     No data to display

## 2023-07-29 NOTE — Discharge Summary (Signed)
Physician Discharge Summary   Patient: Grace Young MRN: [id number] DOB: [birth year]  Admit date:     07/24/2023  Discharge date: 07/29/23  Discharge Physician: Jacquelin Hawking, MD   PCP: Claiborne Rigg, NP   Recommendations at discharge:  PCP visit for hospital follow-up Follow-up RF and CCP labs Prednisone trial  Discharge Diagnoses: Principal Problem:   Hypertensive emergency Active Problems:   Hypokalemia   Back pain   Vitamin B12 deficiency  Resolved Problems:   * No resolved hospital problems. *  Hospital Course: Grace Young is a 57 y.o. female with a history of primary hypertension, Bell's palsy.  Patient presented secondary to left-sided back pain and was found to have evidence of severe hypertension with concern for hypertensive emergency.  CT imaging was negative for dissection or aneurysm.  Patient admitted to the ICU and started on a Cardene drip for management of hypertensive emergency.  Home medications restarted and Cardene drip was weaned off.  Unclear etiology for back pain, but chest pain workup was negative.  Patient transition to an adequate oral regimen for blood pressure control.  Regarding her back pain, unclear etiology but possibly inflammatory.  Trial of prednisone on discharge.  Assessment and Plan:  Hypertensive emergency Present on admission secondary to medication non-adherence after running out of home medications. Patient admitted to the ICU and managed on a Cardene drip with improvement of blood pressure. Home medications restarted. Chest pain workup was negative. Hypertensive emergency resolved.  Patient to be discharged on amlodipine, Coreg, losartan -Continue amlodipine, Coreg, lisinopril   Back pain Unclear etiology. CTA chest/abdomen/pelvis was negative for aneurysm or dissection. CRP and ESR obtained and are elevated. No injury precipitating symptoms. Pain appears to be localized to the trapezius muscles. Some mild improvement from admission. CK  low, TSH normal.  Symptoms improved with continued analgesics, muscle relaxer, prednisone therapy.  Patient able to mobilize on day of discharge.  PT/OT recommending 3 in 1, shower tub, cane.  Patient will need continued follow-up for her back pain.   Dizziness Occurs with mobility. Possibly related to hypertension vs possible orthostatic symptoms. Symptoms affecting ability to mobilize.  Patient symptoms improved and are now resolved.  Orthostatic vitals negative.   Hypokalemia Low of 2.6. Replaced and now resolved.   Hypomagnesemia Low of 1.4. Replaced and now resolved.   Macrocytic anemia Likely secondary to vitamin B12 deficiency. Hemoglobin is stable.   Vitamin B12 deficiency Normal folate. Vitamin B12 level of 115. Patient started on Vitamin B12 supplementation.  Continue vitamin B-12 supplementation.   Hepatic steatosis Noted on CT imaging. Patient will need outpatient follow-up. Would benefit from weight loss techniques and completely abstaining from alcohol consumption.   Consultants: PCCM Procedures performed: Transthoracic Echocardiogram  Disposition: Home Diet recommendation: Cardiac diet   DISCHARGE MEDICATION: Allergies as of 07/29/2023   No Known Allergies      Medication List     STOP taking these medications    ibuprofen 200 MG tablet Commonly known as: ADVIL       TAKE these medications    acetaminophen 500 MG tablet Commonly known as: TYLENOL Take 1,000 mg by mouth daily as needed for moderate pain.   amLODipine 10 MG tablet Commonly known as: NORVASC Take 1 tablet (10 mg total) by mouth daily.   carvedilol 12.5 MG tablet Commonly known as: COREG Take 1 tablet (12.5 mg total) by mouth 2 (two) times daily with a meal. What changed:  medication strength how much to take   CLEAR  EYES OP Place 3 drops into both eyes 3 (three) times daily as needed (irritation).   cyanocobalamin 1000 MCG tablet Take 1 tablet (1,000 mcg total) by mouth  daily. Start taking on: July 30, 2023   cyclobenzaprine 10 MG tablet Commonly known as: FLEXERIL Take 1 tablet (10 mg total) by mouth 3 (three) times daily as needed for up to 10 days for muscle spasms.   losartan 50 MG tablet Commonly known as: COZAAR Take 1 tablet (50 mg total) by mouth daily. Start taking on: July 30, 2023   melatonin 3 MG Tabs tablet Take 1 tablet (3 mg total) by mouth at bedtime as needed.   meloxicam 7.5 MG tablet Commonly known as: Mobic Take 1 tablet (7.5 mg total) by mouth daily.   predniSONE 5 MG tablet Commonly known as: DELTASONE Take 3 tablets (15 mg total) by mouth daily with breakfast for 7 days. Start taking on: July 30, 2023               Durable Medical Equipment  (From admission, onward)           Start     Ordered   07/28/23 1008  For home use only DME Tub bench  Once        07/28/23 1009   07/28/23 1008  For home use only DME 3 n 1  Once        07/28/23 1009   07/28/23 1008  For home use only DME Cane  Once       Comments: Four legs   07/28/23 1009            Follow-up Information     Claiborne Rigg, NP. Schedule an appointment as soon as possible for a visit in 1 week(s).   Specialty: Nurse Practitioner Why: For hospital follow-up Contact information: [address] [place] [address] [phone]                Discharge Exam: BP (!) 158/91 (BP Location: Right Arm)   Pulse 87   Temp 98.8 F (37.1 C) (Oral)   Resp 18   Ht 5\' 8"  (1.727 m)   Wt 107.7 kg   SpO2 97%   BMI 36.10 kg/m   General exam: Appears calm and comfortable Respiratory system: Clear to auscultation. Respiratory effort normal. Cardiovascular system: S1 & S2 heard, RRR.Marland Kitchen Gastrointestinal system: Abdomen is nondistended, soft and nontender.  Normal bowel sounds heard. Central nervous system: Alert and oriented. No focal neurological deficits. Musculoskeletal: No edema. No calf tenderness Skin: No cyanosis. No  rashes Psychiatry: Judgement and insight appear normal. Mood & affect appropriate.   Condition at discharge: stable  The results of significant diagnostics from this hospitalization (including imaging, microbiology, ancillary and laboratory) are listed below for reference.   Imaging Studies: DG Chest 2 View  Result Date: 07/26/2023 CLINICAL DATA:  Back pain. EXAM: CHEST - 2 VIEW COMPARISON:  July 24, 2023. FINDINGS: Stable cardiomediastinal silhouette. Stable bibasilar subsegmental atelectasis or scarring. Bony thorax is unremarkable. IMPRESSION: Stable bibasilar subsegmental atelectasis or scarring. Electronically Signed   By: Lupita Raider M.D.   On: 07/26/2023 14:10   ECHOCARDIOGRAM COMPLETE  Result Date: 07/25/2023    ECHOCARDIOGRAM REPORT   Patient Name:   CHLOYE SPICUZZA Date of Exam: 07/25/2023 Medical Rec #:  [id number]    Height:       68.0 in Accession #:    [id number]   Weight:  240.0 lb Date of Birth:  Feb 14, 1966    BSA:          2.208 m Patient Age:    57 years     BP:           164/98 mmHg Patient Gender: F            HR:           86 bpm. Exam Location:  Inpatient Procedure: 2D Echo, Cardiac Doppler, Color Doppler and Intracardiac            Opacification Agent Indications:    Hypertensive emergency  History:        Patient has no prior history of Echocardiogram examinations.                 Risk Factors:Hypertension. Bell's palsy.  Sonographer:    Milda Smart Referring Phys: [id number] STEPHANIE M REESE  Sonographer Comments: Image acquisition challenging due to patient body habitus and Image acquisition challenging due to respiratory motion. IMPRESSIONS  1. Left ventricular ejection fraction, by estimation, is 60 to 65%. The left ventricle has normal function. The left ventricle has no regional wall motion abnormalities. There is moderate concentric left ventricular hypertrophy. Left ventricular diastolic parameters are indeterminate.  2. Right ventricular systolic function is normal.  The right ventricular size is normal.  3. Left atrial size was mild to moderately dilated.  4. The mitral valve is normal in structure. No evidence of mitral valve regurgitation. No evidence of mitral stenosis.  5. The aortic valve is normal in structure. Aortic valve regurgitation is not visualized. No aortic stenosis is present.  6. The inferior vena cava is normal in size with greater Young 50% respiratory variability, suggesting right atrial pressure of 3 mmHg. Comparison(s): No prior Echocardiogram. Conclusion(s)/Recommendation(s): Otherwise normal echocardiogram, with minor abnormalities described in the report. FINDINGS  Left Ventricle: Left ventricular ejection fraction, by estimation, is 60 to 65%. The left ventricle has normal function. The left ventricle has no regional wall motion abnormalities. Definity contrast agent was given IV to delineate the left ventricular  endocardial borders. The left ventricular internal cavity size was normal in size. There is moderate concentric left ventricular hypertrophy. Left ventricular diastolic parameters are indeterminate. Right Ventricle: The right ventricular size is normal. Right vetricular wall thickness was not well visualized. Right ventricular systolic function is normal. Left Atrium: Left atrial size was mild to moderately dilated. Right Atrium: Right atrial size was normal in size. Pericardium: Trivial pericardial effusion is present. Mitral Valve: The mitral valve is normal in structure. No evidence of mitral valve regurgitation. No evidence of mitral valve stenosis. MV peak gradient, 7.1 mmHg. The mean mitral valve gradient is 4.0 mmHg. Tricuspid Valve: The tricuspid valve is normal in structure. Tricuspid valve regurgitation is trivial. No evidence of tricuspid stenosis. Aortic Valve: The aortic valve is normal in structure. Aortic valve regurgitation is not visualized. No aortic stenosis is present. Pulmonic Valve: The pulmonic valve was grossly normal.  Pulmonic valve regurgitation is trivial. No evidence of pulmonic stenosis. Aorta: The aortic root, ascending aorta and aortic arch are all structurally normal, with no evidence of dilitation or obstruction. Venous: The inferior vena cava is normal in size with greater Young 50% respiratory variability, suggesting right atrial pressure of 3 mmHg. IAS/Shunts: The atrial septum is grossly normal.  LEFT VENTRICLE PLAX 2D LVIDd:         3.70 cm   Diastology LVIDs:  2.50 cm   LV e' medial:    4.35 cm/s LV PW:         1.30 cm   LV E/e' medial:  21.1 LV IVS:        1.40 cm   LV e' lateral:   3.92 cm/s LVOT diam:     2.10 cm   LV E/e' lateral: 23.4 LV SV:         93 LV SV Index:   42 LVOT Area:     3.46 cm  RIGHT VENTRICLE             IVC RV Basal diam:  3.40 cm     IVC diam: 1.40 cm RV S prime:     16.60 cm/s TAPSE (M-mode): 2.3 cm LEFT ATRIUM             Index        RIGHT ATRIUM           Index LA diam:        3.90 cm 1.77 cm/m   RA Area:     11.20 cm LA Vol (A2C):   70.5 ml 31.92 ml/m  RA Volume:   23.30 ml  10.55 ml/m LA Vol (A4C):   59.1 ml 26.76 ml/m LA Biplane Vol: 65.5 ml 29.66 ml/m  AORTIC VALVE LVOT Vmax:   128.00 cm/s LVOT Vmean:  93.500 cm/s LVOT VTI:    0.268 m  AORTA Ao Root diam: 3.10 cm Ao Asc diam:  3.70 cm MITRAL VALVE MV Area (PHT): 4.01 cm     SHUNTS MV Area VTI:   3.72 cm     Systemic VTI:  0.27 m MV Peak grad:  7.1 mmHg     Systemic Diam: 2.10 cm MV Mean grad:  4.0 mmHg MV Vmax:       1.33 m/s MV Vmean:      93.1 cm/s MV Decel Time: 189 msec MV E velocity: 91.80 cm/s MV A velocity: 122.00 cm/s MV E/A ratio:  0.75 Jodelle Red MD Electronically signed by Jodelle Red MD Signature Date/Time: 07/25/2023/5:21:25 PM    Final    DG Shoulder Left  Result Date: 07/24/2023 CLINICAL DATA:  119147 Left shoulder pain [id number] EXAM: LEFT SHOULDER - 2+ VIEW COMPARISON:  None Available. FINDINGS: There is no evidence of fracture or dislocation. Small acromioclavicular spurs. There is  no other evidence of arthropathy or other focal bone abnormality. Soft tissues are unremarkable. IMPRESSION: 1. No acute findings. 2. Mild acromioclavicular spurring. Electronically Signed   By: Corlis Leak M.D.   On: 07/24/2023 22:25   CT Angio Chest/Abd/Pel for Dissection W and/or Wo Contrast  Result Date: 07/24/2023 CLINICAL DATA:  Chest pain, hypertension, back pain since yesterday EXAM: CT ANGIOGRAPHY CHEST, ABDOMEN AND PELVIS TECHNIQUE: Non-contrast CT of the chest was initially obtained. Multidetector CT imaging through the chest, abdomen and pelvis was performed using the standard protocol during bolus administration of intravenous contrast. Multiplanar reconstructed images and MIPs were obtained and reviewed to evaluate the vascular anatomy. RADIATION DOSE REDUCTION: This exam was performed according to the departmental dose-optimization program which includes automated exposure control, adjustment of the mA and/or kV according to patient size and/or use of iterative reconstruction technique. CONTRAST:  OMNIPAQUE IOHEXOL 350 MG/ML SOLN COMPARISON:  CT chest angiogram, 07/26/2018 FINDINGS: CTA CHEST FINDINGS VASCULAR Aorta: Satisfactory opacification of the aorta. Normal contour and caliber of the thoracic aorta. No evidence of aneurysm, dissection, or other acute aortic pathology. Cardiovascular: No evidence  of pulmonary embolism on limited non-tailored examination. Cardiomegaly. No pericardial effusion. Review of the MIP images confirms the above findings. NON VASCULAR Mediastinum/Nodes: No enlarged mediastinal, hilar, or axillary lymph nodes. Small hiatal hernia. Thyroid gland, trachea, and esophagus demonstrate no significant findings. Lungs/Pleura: Extensive dependent bibasilar scarring or atelectasis, with acutely superimposed heterogeneous and ground-glass airspace disease (series 6, image 89). No pleural effusion or pneumothorax. Musculoskeletal: No chest wall abnormality. No acute osseous  findings. Review of the MIP images confirms the above findings. CTA ABDOMEN AND PELVIS FINDINGS VASCULAR Normal contour and caliber of the abdominal aorta. No evidence of aneurysm, dissection, or other acute aortic pathology. Standard branching pattern of the abdominal aorta with solitary bilateral renal arteries. Mild aortic atherosclerosis Review of the MIP images confirms the above findings. NON-VASCULAR Hepatobiliary: No solid liver abnormality is seen. Hepatic steatosis. No gallstones, gallbladder wall thickening, or biliary dilatation. Pancreas: Unremarkable. No pancreatic ductal dilatation or surrounding inflammatory changes. Spleen: Normal in size without significant abnormality. Adrenals/Urinary Tract: Adrenal glands are unremarkable. Simple, benign renal cortical cysts, for which no further follow-up or characterization is required. Kidneys are otherwise normal, without renal calculi, solid lesion, or hydronephrosis. Bladder is unremarkable. Stomach/Bowel: Stomach is within normal limits. Appendix not clearly visualized. No evidence of bowel wall thickening, distention, or inflammatory changes. Lymphatic: No enlarged abdominal or pelvic lymph nodes. Reproductive: No mass or other significant abnormality. Other: No abdominal wall hernia or abnormality. No ascites. Musculoskeletal: No acute osseous findings. IMPRESSION: 1. Normal contour and caliber of the thoracic and abdominal aorta. No evidence of aneurysm, dissection, or other acute aortic pathology. Mild abdominal aortic atherosclerosis. 2. Extensive dependent bibasilar scarring or atelectasis, with acutely superimposed heterogeneous and ground-glass airspace disease, consistent with infection or aspiration. 3. Cardiomegaly. 4. Hepatic steatosis. 5. Small hiatal hernia. Aortic Atherosclerosis (ICD10-I70.0). Electronically Signed   By: Jearld Lesch M.D.   On: 07/24/2023 15:56   DG Chest 2 View  Result Date: 07/24/2023 CLINICAL DATA:  Chest pain  EXAM: CHEST - 2 VIEW COMPARISON:  X-ray 10/14/2018 FINDINGS: Linear opacity at the bases likely scar or atelectasis. No consolidation, pneumothorax or effusion. No edema. Normal cardiopericardial silhouette. Degenerative changes are seen along the spine. IMPRESSION: Basilar scar or atelectasis. Electronically Signed   By: Karen Kays M.D.   On: 07/24/2023 13:57    Microbiology: Results for orders placed or performed during the hospital encounter of 07/24/23  MRSA Next Gen by PCR, Nasal     Status: None   Collection Time: 07/24/23 10:13 PM   Specimen: Nasal Mucosa; Nasal Swab  Result Value Ref Range Status   MRSA by PCR Next Gen NOT DETECTED NOT DETECTED Final    Comment: (NOTE) The GeneXpert MRSA Assay (FDA approved for NASAL specimens only), is one component of a comprehensive MRSA colonization surveillance program. It is not intended to diagnose MRSA infection nor to guide or monitor treatment for MRSA infections. Test performance is not FDA approved in patients less Young 57 years old. Performed at [facility], [address], [place], [place] [address]     Labs: CBC: Recent Labs  Lab 07/24/23 1330 07/25/23 0513 07/27/23 1125  WBC 13.0* 12.9* 12.8*  NEUTROABS 10.8*  --   --   HGB 13.2 12.6 11.3*  HCT 39.1 37.1 34.9*  MCV 109.8* 110.7* 113.3*  PLT 327 315 336   Basic Metabolic Panel: Recent Labs  Lab 07/24/23 1330 07/24/23 2108 07/25/23 0513 07/27/23 1125  NA 138 136 134* 141  K 2.6*  2.8* 3.6 3.7  CL 98 101 99 105  CO2 25 22 23 26   GLUCOSE 133* 114* 173* 106*  BUN 9 9 9  24*  CREATININE 0.65 0.51 0.58 0.85  CALCIUM 9.4 9.1 9.3 9.4  MG 1.4* 1.8 2.3  --   PHOS  --  2.4* 2.4* 2.5   Liver Function Tests: Recent Labs  Lab 07/24/23 1330 07/27/23 1125  AST 34  --   ALT 21  --   ALKPHOS 123  --   BILITOT 1.1  --   PROT 8.9*  --   ALBUMIN 4.0 3.2*    Discharge time spent: 35 minutes.  Signed: Jacquelin Hawking, MD [facility] 07/29/2023

## 2023-07-29 NOTE — Care Plan (Signed)
AVS given. Pt A&O, VSS, IV removed. Ready for discharge.

## 2023-07-30 ENCOUNTER — Telehealth: Payer: Self-pay

## 2023-07-30 NOTE — Telephone Encounter (Signed)
Dr Alvis Lemmings-  FYI from the [facility] call.  Grace Young is listed as PCP but has not seen her in 4 years. I have scheduled her with you 08/01/2023 for a virtual visit.   She stated she has alot of back pain  She is rerquesting pain medication, tramadol.  I explained to her that the providers in our clinic have not seen her in 4 years and she will need an office visit.  She said she can't get out of bed, its hard to stand because of the pain and she can't get to the clinic.  I offered a virtual visit and she was accepting of that option.    She also said that she was given 2 bottles of amlodipine from the pharmacy and she accidentally took a dose of amlodipine from each bottle today. She said she did not verify the labels prior to taking it but now understands the importance of checking the bottle labels.  She has all medications except the prednisone. She said the pharmacy is out of it and she will need to get it tomorrow.

## 2023-07-30 NOTE — Transitions of Care (Post Inpatient/ED Visit) (Signed)
   07/30/2023  Name: Grace Young MRN: [id number] DOB: July 29, 1966  Today's TOC FU Call Status: Today's TOC FU Call Status:: Successful TOC FU Call Completed TOC FU Call Complete Date: 07/30/23  Transition Care Management Follow-up Telephone Call Date of Discharge: 07/29/23 Discharge Facility: [facility]) Type of Discharge: Inpatient Admission Primary Inpatient Discharge Diagnosis:: hypertensive crisis How have you been since you were released from the hospital?: Worse (She stated she has alot of back pain and is not able to get up out of bed.) Any questions or concerns?: Yes Patient Questions/Concerns:: She is rerquesting pain medication, tramadol.  I explained to her that the providers in our clinic have not seen her in 4 years and she will need an office visit.  She said she can't get out of bed, its hard to stand because of the pain and she can't get to the clinic.  I offered a virtual visit and she was accepting of that option.  She also said that she was given 2 bottles of amlodipine from the pharmacy and she accidentally took a dose of amlodipine from each bottle today. She said she did not verify the labels prior to taking it but now understands the importance of checking the bottle labels. Patient Questions/Concerns Addressed: Notified Provider of Patient Questions/Concerns  Items Reviewed: Did you receive and understand the discharge instructions provided?: Yes Medications obtained,verified, and reconciled?: Partial Review Completed Reason for Partial Mediation Review: She has all medications except the prednisone, the pharmacy was out so she will need to get it tomorrow. Any new allergies since your discharge?: No Dietary orders reviewed?: Yes Type of Diet Ordered:: heart healthy , low sodium Do you have support at home?: Yes People in Home: child(ren), adult Name of Support/Comfort Primary Source: daughters.  Medications Reviewed Today: Medications Reviewed Today    Medications were not reviewed in this encounter     Home Care and Equipment/Supplies: Were Home Health Services Ordered?: No Any new equipment or medical supplies ordered?: Yes Name of Medical supply agency?: [employer] Were you able to get the equipment/medical supplies?: Yes Do you have any questions related to the use of the equipment/supplies?: Yes What questions do you have?: She stated that the quad cane it not stable, the tub bench is too big.  I text her the phone number for [employer] as she requested and she said she will call and request replacements. The 3:1 is fine.  Functional Questionnaire: Do you need assistance with bathing/showering or dressing?: No Do you need assistance with meal preparation?: No Do you need assistance with eating?: No Do you have difficulty maintaining continence: No Do you need assistance with getting out of bed/getting out of a chair/moving?: Yes (currently reports back pain that is impacting her mobility.) Do you have difficulty managing or taking your medications?: No  Follow up appointments reviewed: PCP Follow-up appointment confirmed?: Yes Date of PCP follow-up appointment?: 08/01/23 Follow-up Provider: [facility] Follow-up appointment confirmed?: NA Do you need transportation to your follow-up appointment?: No Do you understand care options if your condition(s) worsen?: Yes-patient verbalized understanding    SIGNATURE Robyne Peers, RN

## 2023-07-30 NOTE — Telephone Encounter (Signed)
A prescription for 90 tablets of amlodipine was sent to [facility] pharmacy yesterday by Dr Caleb Popp.  I will address other concerns at her visit in 2 days.

## 2023-07-31 NOTE — Telephone Encounter (Signed)
Noted.  I spoke to the patient and informed her that Dr Alvis Lemmings will address her concerns at the appointment tomorrow.

## 2023-08-01 ENCOUNTER — Telehealth (HOSPITAL_BASED_OUTPATIENT_CLINIC_OR_DEPARTMENT_OTHER): Payer: Medicaid Other | Admitting: Family Medicine

## 2023-08-01 DIAGNOSIS — I160 Hypertensive urgency: Secondary | ICD-10-CM

## 2023-08-01 DIAGNOSIS — M542 Cervicalgia: Secondary | ICD-10-CM

## 2023-08-01 DIAGNOSIS — I10 Essential (primary) hypertension: Secondary | ICD-10-CM | POA: Diagnosis not present

## 2023-08-01 DIAGNOSIS — M109 Gout, unspecified: Secondary | ICD-10-CM | POA: Diagnosis not present

## 2023-08-01 MED ORDER — ALLOPURINOL 300 MG PO TABS: 300.0000 mg | ORAL_TABLET | Freq: Every day | ORAL | 3 refills | Status: DC

## 2023-08-01 MED ORDER — GABAPENTIN 300 MG PO CAPS: 300.0000 mg | ORAL_CAPSULE | Freq: Two times a day (BID) | ORAL | 3 refills | Status: DC

## 2023-08-01 NOTE — Patient Instructions (Signed)
Low-Purine Eating Plan A low-purine eating plan involves making food choices to limit your purine intake. Purine is a kind of uric acid. Too much uric acid in your blood can cause certain conditions, such as gout and kidney stones. Eating a low-purine diet may help control these conditions. What are tips for following this plan? Shopping Avoid buying products that contain high-fructose corn syrup. Check for this on food labels. It is commonly found in many processed foods and soft drinks. Be sure to check for it in baked goods such as cookies, canned fruits, and cereals and cereal bars. Avoid buying veal, chicken breast with skin, lamb, and organ meats such as liver. These types of meats tend to have the highest purine content. Choose dairy products. These may lower uric acid levels. Avoid certain types of fish. Not all fish and seafood have high purine content. Examples with high purine content include anchovies, trout, tuna, sardines, and salmon. Avoid buying beverages that contain alcohol, particularly beer and hard liquor. Alcohol can affect the way your body gets rid of uric acid. Meal planning  Learn which foods do or do not affect you. If you find out that a food tends to cause your gout symptoms to flare up, avoid eating that food. You can enjoy foods that do not cause problems. If you have any questions about a food item, talk with your dietitian or health care provider. Reduce the overall amount of meat in your diet. When you do eat meat, choose ones with lower purine content. Include plenty of fruits and vegetables. Although some vegetables may have a high purine content--such as asparagus, mushrooms, spinach, or cauliflower--it has been shown that these do not contribute to uric acid blood levels as much. Consume at least 1 dairy serving a day. This has been shown to decrease uric acid levels. General information If you drink alcohol: Limit how much you have to: 0-1 drink a day for  women who are not pregnant. 0-2 drinks a day for men. Know how much alcohol is in a drink. In the [place] one drink equals one 12 oz bottle of beer (355 mL), one 5 oz glass of wine (148 mL), or one 1 oz glass of hard liquor (44 mL). Drink plenty of water. Try to drink enough to keep your urine pale yellow. Fluids can help remove uric acid from your body. Work with your health care provider and dietitian to develop a plan to achieve or maintain a healthy weight. Losing weight may help reduce uric acid in your blood. What foods are recommended? The following are some types of foods that are good choices when limiting purine intake: Fresh or frozen fruits and vegetables. Whole grains, breads, cereals, and pasta. Rice. Beans, peas, legumes. Nuts and seeds. Dairy products. Fats and oils. The items listed above may not be a complete list. Talk with a dietitian about what dietary choices are best for you. What foods are not recommended? Limit your intake of foods high in purines, including: Beer and other alcohol. Meat-based gravy or sauce. Canned or fresh fish, such as: Anchovies, sardines, herring, salmon, and tuna. Mussels and scallops. Codfish, trout, and haddock. Bacon, veal, chicken breast with skin, and lamb. Organ meats, such as: Liver or kidney. Tripe. Sweetbreads (thymus gland or pancreas). Wild Education officer, environmental. Yeast or yeast extract supplements. Drinks sweetened with high-fructose corn syrup, such as soda. Processed foods made with high-fructose corn syrup. The items listed above may not be a complete list of foods  and beverages you should limit. Contact a dietitian for more information. Summary Eating a low-purine diet may help control conditions caused by too much uric acid in the body, such as gout or kidney stones. Choose low-purine foods, limit alcohol, and limit high-fructose corn syrup. You will learn over time which foods do or do not affect you. If you find out that a  food tends to cause your gout symptoms to flare up, avoid eating that food. This information is not intended to replace advice given to you by your health care provider. Make sure you discuss any questions you have with your health care provider. Document Revised: 11/23/2021 Document Reviewed: 11/23/2021 Elsevier Patient Education  2024 [employer].

## 2023-08-01 NOTE — Progress Notes (Signed)
Virtual Visit via Video Note  I connected with Grace Young, on 08/01/2023 at 11:59 AM by video enabled telemedicine device and verified that I am speaking with the correct person using two identifiers.   Consent: I discussed the limitations, risks, security and privacy concerns of performing an evaluation and management service by telemedicine and the availability of in person appointments. I also discussed with the patient that there may be a patient responsible charge related to this service. The patient expressed understanding and agreed to proceed.   Location of Patient: Home  Location of Provider: Clinic   Persons participating in Telemedicine visit: Grace Young Dr. Alvis Lemmings     History of Present Illness: Grace Young is a 57 y.o. year old female seen of Bertram Denver, FNP with history of hypertension Seen today for hospital follow-up where she was admitted for hypertensive crisis from 07/24/2023 to 07/29/2023.  At that visit she had also complained of back pain.  Discussed the use of AI scribe software for clinical note transcription with the patient, who gave verbal consent to proceed.  She presents with back and neck pain. The pain, which started a couple of days prior to her hospital admission, has not improved. She has pain and swelling at the back of her neck, 'near the beginning of her spine'. The pain is localized and does not radiate down her back. The pain is worse when she turns her head to the right. She denies any numbness or tingling radiating down to her hands.  She has severe pain and swelling in her feet that alternates between the left and right foot. The pain starts at the big toe and works its way to the baby toe and then up to the ankle. The patient reports that her feet are often so swollen that she has to buy larger shoes and can only wear sneakers. She also reports having arthritis in her left knee, which is worse than the right knee, and has had four gel  shots in each knee for pain relief. Of note uric acid level 4 years ago was 8.9 and most recently 9.4 which she was unaware of.  The patient was recently hospitalized due to hypertensive urgency (after running out of her medications) and back pain. During her hospital stay, she underwent a CT angiogram of the chest, chest x-ray, and shoulder x-ray, but no neck x-ray.  PE, aortic dissection were ruled out.  The patient reports that the pain medications prescribed at discharge, Flexeril and prednisone, are not providing adequate pain relief. She has been supplementing with acetaminophen, but reports that it is also not effective.  She has also not been checking her blood pressure since discharge.  CTA chest from 06/2023: IMPRESSION: 1. Normal contour and caliber of the thoracic and abdominal aorta. No evidence of aneurysm, dissection, or other acute aortic pathology. Mild abdominal aortic atherosclerosis. 2. Extensive dependent bibasilar scarring or atelectasis, with acutely superimposed heterogeneous and ground-glass airspace disease, consistent with infection or aspiration. 3. Cardiomegaly. 4. Hepatic steatosis. 5. Small hiatal hernia.       Past Medical History:  Diagnosis Date   Bell's palsy    Hypertension    No Known Allergies  Current Outpatient Medications on File Prior to Visit  Medication Sig Dispense Refill   acetaminophen (TYLENOL) 500 MG tablet Take 1,000 mg by mouth daily as needed for moderate pain.     amLODipine (NORVASC) 10 MG tablet Take 1 tablet (10 mg total) by mouth daily. 90 tablet  0   carvedilol (COREG) 12.5 MG tablet Take 1 tablet (12.5 mg total) by mouth 2 (two) times daily with a meal. 180 tablet 0   cyanocobalamin 1000 MCG tablet Take 1 tablet (1,000 mcg total) by mouth daily. 30 tablet 0   cyclobenzaprine (FLEXERIL) 10 MG tablet Take 1 tablet (10 mg total) by mouth 3 (three) times daily as needed for up to 10 days for muscle spasms. 30 tablet 0   losartan  (COZAAR) 50 MG tablet Take 1 tablet (50 mg total) by mouth daily. 90 tablet 0   melatonin 3 MG TABS tablet Take 1 tablet (3 mg total) by mouth at bedtime as needed. 30 tablet 0   meloxicam (MOBIC) 7.5 MG tablet Take 1 tablet (7.5 mg total) by mouth daily. 30 tablet 1   Naphazoline HCl (CLEAR EYES OP) Place 3 drops into both eyes 3 (three) times daily as needed (irritation).     predniSONE (DELTASONE) 5 MG tablet Take 3 tablets (15 mg total) by mouth daily with breakfast for 7 days. 21 tablet 0   No current facility-administered medications on file prior to visit.    ROS: See HPI  Observations/Objective: Awake, alert, oriented x3 Not in acute distress Tenderness in the neck on lateral range of motion to the right but not to the left. Tenderness to palpation of posterior cervical spine and bilateral trapezius muscles Normal mood      Latest Ref Rng & Units 07/27/2023   11:25 AM 07/25/2023    5:13 AM 07/24/2023    9:08 PM  CMP  Glucose 70 - 99 mg/dL 440  347  425   BUN 6 - 20 mg/dL 24  9  9    Creatinine 0.44 - 1.00 mg/dL 9.56  3.87  5.64   Sodium 135 - 145 mmol/L 141  134  136   Potassium 3.5 - 5.1 mmol/L 3.7  3.6  2.8   Chloride 98 - 111 mmol/L 105  99  101   CO2 22 - 32 mmol/L 26  23  22    Calcium 8.9 - 10.3 mg/dL 9.4  9.3  9.1     Lipid Panel     Component Value Date/Time   CHOL 209 (H) 05/09/2019 0228   TRIG 334 (H) 05/09/2019 0228   HDL 59 05/09/2019 0228   CHOLHDL 3.5 05/09/2019 0228   VLDL 67 (H) 05/09/2019 0228   LDLCALC 83 05/09/2019 0228    Lab Results  Component Value Date   HGBA1C 5.4 05/09/2019     Assessment and Plan:     Neck Pain New onset of neck pain since hospitalization. Pain localized to the back of the neck at the base of the skull, with no radiation. No associated numbness or tingling. Pain exacerbated by turning head to the right. -Order neck X-ray to evaluate for possible arthritis or neck spurs. -Start Gabapentin for pain management, to be  taken twice daily. -Refer to physical therapy for neck pain management.  Gout History of elevated uric acid levels and recurrent episodes of foot pain suggestive of gout flares. -Start Allopurinol for gout prevention. -Advise on dietary modifications to reduce gout flares (reduce intake of red meat, seafood, and wine).  Hypertension Recent hospitalization due to high blood pressure. -She ideally needs an in person visit to assess blood pressure control -Advise patient to obtain a blood pressure cuff for home monitoring. -Schedule follow-up appointment in 1 month to monitor blood pressure and gout management.  -Counseled on blood pressure goal  of less than 130/80, low-sodium, DASH diet, medication compliance, 150 minutes of moderate intensity exercise per week. Discussed medication compliance, adverse effects.        Follow Up Instructions: 1 month with PCP for follow-up of hypertension and gout   I discussed the assessment and treatment plan with the patient. The patient was provided an opportunity to ask questions and all were answered. The patient agreed with the plan and demonstrated an understanding of the instructions.   The patient was advised to call back or seek an in-person evaluation if the symptoms worsen or if the condition fails to improve as anticipated.     I provided 25 minutes total of Telehealth time during this encounter including median intraservice time, reviewing previous notes, investigations, ordering medications, medical decision making, coordinating care and patient verbalized understanding at the end of the visit.     Hoy Register, MD, FAAFP. [facility] [place], [place] [phone]   08/01/2023, 11:59 AM

## 2023-08-08 ENCOUNTER — Telehealth: Payer: Self-pay | Admitting: Nurse Practitioner

## 2023-08-08 NOTE — Telephone Encounter (Signed)
Please advised  

## 2023-08-08 NOTE — Telephone Encounter (Signed)
Pt is calling in because she had a virtual visit with Dr. Alvis Lemmings and says she was prescribed medication but it hasn't been helping. Pt says she would like to prescribed something for pain because her back is swollen and she can hardly move her neck. Please follow up with pt if something is able to be sent in per pt's request.

## 2023-08-08 NOTE — Telephone Encounter (Signed)
Pt called and informed that referral has been placed for PT, pt states that she has an appointment with them on Monday I informed her to make sure to keep the appointment and discuss pain with them.

## 2023-08-09 NOTE — Therapy (Deleted)
OUTPATIENT PHYSICAL THERAPY CERVICAL EVALUATION   Patient Name: Grace Young MRN: [id number] DOB:1966/05/21, 57 y.o., female Today's Date: 08/09/2023  END OF SESSION:   Past Medical History:  Diagnosis Date   Bell's palsy    Hypertension    Past Surgical History:  Procedure Laterality Date   cyst removal from uterus and ovaries     TONSILLECTOMY     Patient Active Problem List   Diagnosis Date Noted   Gout 08/01/2023   Back pain 07/29/2023   Vitamin B12 deficiency 07/29/2023   Macrocytic anemia 07/29/2023   Hypertensive emergency 07/24/2023   Hypokalemia 05/08/2019   Bell's palsy 05/08/2019   ICH (intracerebral hemorrhage) (HCC) 05/08/2019   Hypertensive crisis 10/15/2018   Hypertensive urgency 10/14/2018   Right ankle pain 10/14/2018    PCP: Claiborne Rigg, NP  REFERRING PROVIDER: Hoy Register, MD  REFERRING DIAG:  Diagnosis  M54.2 (ICD-10-CM) - Neck pain    THERAPY DIAG:  No diagnosis found.  Rationale for Evaluation and Treatment: Rehabilitation  ONSET DATE: 08/01/23  SUBJECTIVE:                                                                                                                                                                                                         SUBJECTIVE STATEMENT: *** Hand dominance: {MISC; OT HAND DOMINANCE:[phone]}  PERTINENT HISTORY:  Recent hospitalization for HTN crisis, H/O arthritis, B knees, L worse than R. Neck Pain New onset of neck pain since hospitalization. Pain localized to the back of the neck at the base of the skull, with no radiation. No associated numbness or tingling. Pain exacerbated by turning head to the right. Gout History of elevated uric acid levels and recurrent episodes of foot pain suggestive of gout flares.   PAIN:  Are you having pain? {OPRCPAIN:27236}  PRECAUTIONS: Fall  RED FLAGS: None     WEIGHT BEARING RESTRICTIONS: No  FALLS:  Has patient fallen in last 6 months?  {fallsyesno:27318}  LIVING ENVIRONMENT: Lives with: {OPRC lives with:25569::"lives with their family"} Lives in: {Lives in:25570} Stairs: {opstairs:27293} Has following equipment at home: {Assistive devices:23999}  OCCUPATION: ***  PLOF: {PLOF:24004}  PATIENT GOALS: ***  NEXT MD VISIT: ***  OBJECTIVE:   DIAGNOSTIC FINDINGS:  ***  PATIENT SURVEYS:  {rehab surveys:24030}  COGNITION: Overall cognitive status: {cognition:24006}  SENSATION: {sensation:27233}  POSTURE: {posture:25561}  PALPATION: ***   CERVICAL ROM:   {AROM/PROM:27142} ROM A/PROM (deg) eval  Flexion   Extension   Right lateral flexion   Left lateral flexion   Right rotation   Left rotation    (  Blank rows = not tested)  UPPER EXTREMITY ROM:  {AROM/PROM:27142} ROM Right eval Left eval  Shoulder flexion    Shoulder extension    Shoulder abduction    Shoulder adduction    Shoulder extension    Shoulder internal rotation    Shoulder external rotation    Elbow flexion    Elbow extension    Wrist flexion    Wrist extension    Wrist ulnar deviation    Wrist radial deviation    Wrist pronation    Wrist supination     (Blank rows = not tested)  UPPER EXTREMITY MMT:  MMT Right eval Left eval  Shoulder flexion    Shoulder extension    Shoulder abduction    Shoulder adduction    Shoulder extension    Shoulder internal rotation    Shoulder external rotation    Middle trapezius    Lower trapezius    Elbow flexion    Elbow extension    Wrist flexion    Wrist extension    Wrist ulnar deviation    Wrist radial deviation    Wrist pronation    Wrist supination    Grip strength     (Blank rows = not tested)  CERVICAL SPECIAL TESTS:  {Cervical special tests:25246}  FUNCTIONAL TESTS:  {Functional tests:24029}  TODAY'S TREATMENT:                                                                                                                              DATE:  08/12/23 Education    PATIENT EDUCATION:  Education details: POC Person educated: Patient Education method: Explanation Education comprehension: verbalized understanding  HOME EXERCISE PROGRAM: ***  ASSESSMENT:  CLINICAL IMPRESSION: Patient is a 57 y.o. who was seen today for physical therapy evaluation and treatment for ***.   OBJECTIVE IMPAIRMENTS: Abnormal gait, decreased activity tolerance, decreased balance, decreased coordination, decreased endurance, difficulty walking, decreased ROM, decreased strength, impaired flexibility, improper body mechanics, postural dysfunction, and pain.   ACTIVITY LIMITATIONS: carrying, lifting, standing, squatting, reach over head, and locomotion level  PARTICIPATION LIMITATIONS: meal prep, cleaning, laundry, driving, shopping, community activity, and occupation  PERSONAL FACTORS: Past/current experiences are also affecting patient's functional outcome.   REHAB POTENTIAL: Good  CLINICAL DECISION MAKING: Stable/uncomplicated  EVALUATION COMPLEXITY: Moderate   GOALS: Goals reviewed with patient? Yes  SHORT TERM GOALS: Target date: ***  *** Baseline:  Goal status: INITIAL  2.  *** Baseline:  Goal status: INITIAL  3.  *** Baseline:  Goal status: INITIAL  4.  *** Baseline:  Goal status: INITIAL  5.  *** Baseline:  Goal status: INITIAL  6.  *** Baseline:  Goal status: INITIAL  LONG TERM GOALS: Target date: ***  *** Baseline:  Goal status: INITIAL  2.  *** Baseline:  Goal status: INITIAL  3.  *** Baseline:  Goal status: INITIAL  4.  *** Baseline:  Goal status: INITIAL  5.  *** Baseline:  Goal status: INITIAL  6.  *** Baseline:  Goal status: INITIAL   PLAN:  PT FREQUENCY: {rehab frequency:25116}  PT DURATION: {rehab duration:25117}  PLANNED INTERVENTIONS: {rehab planned interventions:25118::"Therapeutic exercises","Therapeutic activity","Neuromuscular re-education","Balance training","Gait training","Patient/Family  education","Self Care","Joint mobilization"}  PLAN FOR NEXT SESSION: ***   Iona Beard, DPT 08/09/2023, 11:22 AM

## 2023-08-12 ENCOUNTER — Ambulatory Visit: Payer: Medicaid Other | Attending: Family Medicine | Admitting: Physical Therapy

## 2023-08-19 ENCOUNTER — Ambulatory Visit: Payer: Self-pay | Admitting: *Deleted

## 2023-08-19 NOTE — Telephone Encounter (Signed)
Pt had video visit with [place] on 08/01/2023 for gout flare. Pt is asking if she can double her Allopurinol or can she add another medication.

## 2023-08-19 NOTE — Telephone Encounter (Signed)
  Chief Complaint: Feet Pain Symptoms: Both feet painful Left>right. Left foot swollen "Can't even walk on it." 10/10 pain ,Throbbing. Frequency: 07/29/23. VV 08/01/23. Worsening Pertinent Negatives: Patient denies redness, warmth. Disposition: [] ED /[] Urgent Care (no appt availability in office) / [] Appointment(In office/virtual)/ []  [facility]/ [] Home Care/ [] Refused Recommended Disposition /[] [facility]/ []  Follow-up with PCP Additional Notes: Pt questioning if she could "Double up on gout med." On allopurinol.  Asking if alternate med "I read about colchicine too."  States could not make it to P.T. appt due to pain. Pt states will not go to UC/ED. Please advise.  Reason for Disposition  [1] MODERATE pain (e.g., interferes with normal activities, limping) AND [2] present > 3 days  Answer Assessment - Initial Assessment Questions 1. ONSET: "When did the pain start?"      07/30/23 2. LOCATION: "Where is the pain located?"      Both, left>right 3. PAIN: "How bad is the pain?"    (Scale 1-10; or mild, moderate, severe)  - MILD (1-3): doesn't interfere with normal activities.   - MODERATE (4-7): interferes with normal activities (e.g., work or school) or awakens from sleep, limping.   - SEVERE (8-10): excruciating pain, unable to do any normal activities, unable to walk.      10/10 throbbing. 4. WORK OR EXERCISE: "Has there been any recent work or exercise that involved this part of the body?"      no 5. CAUSE: "What do you think is causing the foot pain?"     Gout 6. OTHER SYMPTOMS: "Do you have any other symptoms?" (e.g., leg pain, rash, fever, numbness)     Swelling left foot, tender to touch,  Protocols used: Foot Pain-A-AH

## 2023-08-21 ENCOUNTER — Other Ambulatory Visit: Payer: Self-pay | Admitting: Nurse Practitioner

## 2023-08-21 DIAGNOSIS — M109 Gout, unspecified: Secondary | ICD-10-CM

## 2023-08-21 MED ORDER — COLCHICINE 0.6 MG PO TABS
ORAL_TABLET | ORAL | 0 refills | Status: DC
Start: 2023-08-21 — End: 2023-09-09

## 2023-08-21 NOTE — Telephone Encounter (Signed)
Patient identified by name and date of birth.   Patient aware of results and voiced understanding.

## 2023-08-21 NOTE — Telephone Encounter (Signed)
I will send colchicine. This is only to be taken temporarily for a few days. Continue allopurinol while taking. Do not double dose allopurinol.

## 2023-08-28 ENCOUNTER — Telehealth: Payer: Self-pay | Admitting: Nurse Practitioner

## 2023-08-28 NOTE — Telephone Encounter (Signed)
Copied from CRM [id number]. Topic: Appointment Scheduling - Scheduling Inquiry for Clinic >> Aug 28, 2023 12:11 PM Santiya F wrote: Reason for CRM: Pt is calling in because she is still having trouble with her gout. Pt says her toes are giving her issues and she needs to see a doctor as soon as possible because she is unable to walk. Pt is requesting a virtual video visit with any provider ASAP so she can show the issue and get some help and potential relief for it. Please follow up with pt.

## 2023-08-30 ENCOUNTER — Other Ambulatory Visit: Payer: Self-pay | Admitting: Nurse Practitioner

## 2023-08-30 DIAGNOSIS — G8929 Other chronic pain: Secondary | ICD-10-CM

## 2023-08-30 NOTE — Telephone Encounter (Signed)
Patient aware of response from provider.  Patient identified by name and date of birth.

## 2023-08-30 NOTE — Telephone Encounter (Signed)
She should still be taking the colchicine. It says up to 10 days

## 2023-08-30 NOTE — Telephone Encounter (Signed)
Referred to ortho. Meloxicam will be sent. She should not be finished with colchicine until tomorrow.

## 2023-08-30 NOTE — Telephone Encounter (Signed)
Patients is now stating that her left knee is causing her pain and is swollen. Patient states that she is finished with the colchicine and it did help her feet, but nothing else.

## 2023-09-04 ENCOUNTER — Ambulatory Visit: Payer: Medicaid Other | Admitting: Surgical

## 2023-09-09 ENCOUNTER — Ambulatory Visit: Payer: Self-pay

## 2023-09-09 ENCOUNTER — Encounter: Payer: Self-pay | Admitting: Nurse Practitioner

## 2023-09-09 ENCOUNTER — Ambulatory Visit: Payer: Medicaid Other | Attending: Nurse Practitioner | Admitting: Nurse Practitioner

## 2023-09-09 ENCOUNTER — Other Ambulatory Visit: Payer: Self-pay | Admitting: Nurse Practitioner

## 2023-09-09 DIAGNOSIS — M109 Gout, unspecified: Secondary | ICD-10-CM

## 2023-09-09 MED ORDER — ALLOPURINOL 300 MG PO TABS
300.0000 mg | ORAL_TABLET | Freq: Every day | ORAL | 1 refills | Status: AC
Start: 2023-09-09 — End: ?

## 2023-09-09 MED ORDER — COLCHICINE 0.6 MG PO TABS
ORAL_TABLET | ORAL | 0 refills | Status: DC
Start: 2023-09-09 — End: 2023-10-21

## 2023-09-09 NOTE — Progress Notes (Signed)
Virtual Visit Note  I discussed the limitations, risks, security and privacy concerns of performing an evaluation and management service by video and the availability of in person appointments. I also discussed with the patient that there may be a patient responsible charge related to this service. The patient expressed understanding and agreed to proceed.    I connected with Grace Young on 09/09/23  at   1:50 PM EDT  EDT by VIDEO and verified that I am speaking with the correct person using two identifiers.   Location of Patient: Private Residence   Location of Provider: [facility]    Persons participating in VIRTUAL visit: Bertram Denver FNP-BC Grace Young    History of Present Illness: VIRTUAL visit for: GOUT FLARE  Gout  The patient reports onset of an acute gout attack involving the left foot beginning several days, and being treated with none. Attacks occur primarily in the feet. Patient reports  her joint stiffness is ongoing and her joint swelling is ongoing. Limitation on activities include difficulty with walking. The patient is not avoiding high purine foods and is not taking allopurinol as prescribed.          Past Medical History:  Diagnosis Date   Bell's palsy    Hypertension     Past Surgical History:  Procedure Laterality Date   cyst removal from uterus and ovaries     TONSILLECTOMY      Family History  Problem Relation Age of Onset   Hypertension Sister    Breast cancer Cousin    Diabetes Mellitus II Neg Hx    Diabetes Neg Hx     Social History   Socioeconomic History   Marital status: Single    Spouse name: Not on file   Number of children: Not on file   Years of education: Not on file   Highest education level: Not on file  Occupational History   Not on file  Tobacco Use   Smoking status: Never   Smokeless tobacco: Never  Substance and Sexual Activity   Alcohol use: Yes    Alcohol/week: 1.0 standard drink  of alcohol    Types: 1 Shots of liquor per week    Comment: Wine occasionally    Drug use: No   Sexual activity: Not Currently  Other Topics Concern   Not on file  Social History Narrative   Not on file   Social Determinants of Health   Financial Resource Strain: Not on file  Food Insecurity: No Food Insecurity (07/26/2023)   Hunger Vital Sign    Worried About Running Out of Food in the Last Year: Never true    Ran Out of Food in the Last Year: Never true  Transportation Needs: No Transportation Needs (07/26/2023)   PRAPARE - Administrator, Civil Service (Medical): No    Lack of Transportation (Non-Medical): No  Physical Activity: Not on file  Stress: Not on file  Social Connections: Not on file     Observations/Objective: Awake, alert and oriented x 3   Review of Systems  Constitutional:  Negative for fever, malaise/fatigue and weight loss.  HENT: Negative.  Negative for nosebleeds.   Eyes: Negative.  Negative for blurred vision, double vision and photophobia.  Respiratory: Negative.  Negative for cough and shortness of breath.   Cardiovascular: Negative.  Negative for chest pain, palpitations and leg swelling.  Gastrointestinal: Negative.  Negative for heartburn, nausea and vomiting.  Musculoskeletal:  Positive for joint  pain. Negative for myalgias.  Neurological: Negative.  Negative for dizziness, focal weakness, seizures and headaches.  Psychiatric/Behavioral: Negative.  Negative for suicidal ideas.     Assessment and Plan: Diagnoses and all orders for this visit:  Acute gout, unspecified cause, unspecified site -     colchicine 0.6 MG tablet; Take 2 tablets on day 1 then on day 2 take 1 tablet by mouth daily -     allopurinol (ZYLOPRIM) 300 MG tablet; Take 1 tablet (300 mg total) by mouth daily. Instructions given to take allopurinol daily as prescribed.     Follow Up Instructions Return if symptoms worsen or fail to improve.     I discussed the  assessment and treatment plan with the patient. The patient was provided an opportunity to ask questions and all were answered. The patient agreed with the plan and demonstrated an understanding of the instructions.   The patient was advised to call back or seek an in-person evaluation if the symptoms worsen or if the condition fails to improve as anticipated.  I provided 12 minutes of face-to-face time during this encounter including median intraservice time, reviewing previous notes, labs, imaging, medications and explaining diagnosis and management.  Claiborne Rigg, FNP-BC

## 2023-09-09 NOTE — Telephone Encounter (Signed)
Chief Complaint: Foot Pain  Symptoms: Left foot swollen, Pain 8/10, right great toe pain , elbow pain Frequency: constant  Pertinent Negatives: Patient denies chest pain, fever, redness  Disposition: [] ED /[] Urgent Care (no appt availability in office) / [] Appointment(In office/virtual)/ []  [facility]/ [] Home Care/ [x] Refused Recommended Disposition /[] [facility]/ []  Follow-up with PCP Additional Notes: Patient reports she was treated for Gout on  08/21/23 and is having another Gout flare-up in the left great toe. Patient reports left foot swelling 8/10 pain and unable to walk on the foot. Patient states she is taking tylenol and ibuprofen and neither has improved her pain at this time. Patient also stated that the elbow is hurting as well. Care advice was given and patient was offered an appointment tomorrow with PCP. Patient declined stating she can not walk. Asked patient if she would like EMS called and patient declined. Patient stated she would like a refill on the Colchicine Rx and after her foot is better she will consider going to urgent care to have her elbow checked out. Advised I would forward request to PCP for refill approval.  Summary: Foot swelling, cannot walk, Rx request   Feet are swollen and cannot walk  Medication Refill - Medication: colchicine 0.6 MG tablet  Has the patient contacted their pharmacy? Yes.   (Agent: If no, request that the patient contact the pharmacy for the refill. If patient does not wish to contact the pharmacy document the reason why and proceed with request.) (Agent: If yes, when and what did the pharmacy advise?)  Preferred Pharmacy (with phone number or street name): [facility] [address] [place], [place] - [address] [place] [address] Phone: [phone] Fax: [phone]   Has the patient been seen for an appointment in the last year OR does the patient have an upcoming appointment?  Yes.    Agent: Please be advised that RX refills may take up to 3 business days. We ask that you follow-up with your pharmacy.     Reason for Disposition  [1] Swollen foot AND [2] no fever  (Exceptions: localized bump from bunions, calluses, insect bite, sting)  Answer Assessment - Initial Assessment Questions 1. ONSET: "When did the pain start?"      2 days ago  2. LOCATION: "Where is the pain located?"      Left foot  3. PAIN: "How bad is the pain?"    (Scale 1-10; or mild, moderate, severe)  - MILD (1-3): doesn't interfere with normal activities.   - MODERATE (4-7): interferes with normal activities (e.g., work or school) or awakens from sleep, limping.   - SEVERE (8-10): excruciating pain, unable to do any normal activities, unable to walk.      8/10 4. WORK OR EXERCISE: "Has there been any recent work or exercise that involved this part of the body?"      No  5. CAUSE: "What do you think is causing the foot pain?"     I think I have Gout in my toe again  6. OTHER SYMPTOMS: "Do you have any other symptoms?" (e.g., leg pain, rash, fever, numbness)     Swelling in the foot, right great toe pain,  Protocols used: Foot Pain-A-AH

## 2023-09-09 NOTE — Telephone Encounter (Signed)
Please Advise

## 2023-09-11 ENCOUNTER — Ambulatory Visit: Payer: Medicaid Other | Admitting: Surgical

## 2023-09-18 ENCOUNTER — Encounter: Payer: Self-pay | Admitting: Surgical

## 2023-09-18 ENCOUNTER — Ambulatory Visit: Payer: Medicaid Other | Admitting: Surgical

## 2023-09-18 ENCOUNTER — Other Ambulatory Visit (INDEPENDENT_AMBULATORY_CARE_PROVIDER_SITE_OTHER): Payer: Self-pay

## 2023-09-18 DIAGNOSIS — M542 Cervicalgia: Secondary | ICD-10-CM | POA: Diagnosis not present

## 2023-09-18 DIAGNOSIS — G8929 Other chronic pain: Secondary | ICD-10-CM

## 2023-09-18 DIAGNOSIS — M25562 Pain in left knee: Secondary | ICD-10-CM | POA: Diagnosis not present

## 2023-09-18 DIAGNOSIS — M25522 Pain in left elbow: Secondary | ICD-10-CM

## 2023-09-24 ENCOUNTER — Encounter: Payer: Self-pay | Admitting: Surgical

## 2023-09-24 NOTE — Progress Notes (Signed)
Office Visit Note   Patient: Grace Young           Date of Birth: 26-Jun-1966           MRN: [id number] Visit Date: 09/18/2023 Requested by: Claiborne Rigg, NP [address] [place] [address] [place],  [place] [address] PCP: Claiborne Rigg, NP  Subjective: Chief Complaint  Patient presents with   Left Elbow - Pain   Left Knee - Pain    HPI: Grace Young is a 57 y.o. female who presents to the office reporting multiple joint complaints.  Patient reports left knee pain that has been ongoing for 3 years.  No recent injury but states that she did have a fall about 6 years ago at her house.  She states that the knee has pain diffusely throughout the entire knee.  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  She does have stiffness with immobility and stiffness in the morning.  No history of prior knee surgery.  No radiation of pain or any groin pain.  She has never had a cortisone injection into the knee but she has had gel injections prior.  Does not feel that the gel injections were any significant help.  In addition to the knee pain she also describes left elbow pain.  She has had elbow pain that has been ongoing for 2 to 3 weeks.  She feels that she has occasional popping sensation in the elbow and she will have very intense pain at times.  She has had 1 locking episode that does not sound like a true locking episode but more of a it hurts so bad that I could not move my elbow.  She occasionally will have to push-up with her left arm due to the amount of knee pain she has and this will cause increased pain to the elbow.  No history of prior elbow surgery.  She does have history of gout as diagnosed by her PCP.  She is currently on allopurinol and takes occasional colchicine when she has gout flares.  In addition to these joint complaints she also notes a "lump" in the left side of her neck that is  been present for 6 months.  She states that this "lump" will oscillate in size and gets up to about the size of a lemon at times.  This has been becoming more more prominence.  Gets worse when she is working or lifting things.  She denies any personal history of cancer.  No history of edematous swelling in the left arm.  No diabetes.  She does not smoke..                ROS: All systems reviewed are negative as they relate to the chief complaint within the history of present illness.  Patient denies fevers or chills.  Assessment & Plan: Visit Diagnoses:  1. Pain in left elbow   2. Neck pain   3. Chronic pain of left knee     Plan: Patient is a 56 year old female who presents for evaluation of multiple joint complaints.  She has left knee and left elbow pain.  Left knee pain has been ongoing for years and she has recent radiographs from earlier this year demonstrating primarily patellofemoral arthritis with mild degenerative changes in the medial and lateral compartments.  We discussed the options available to patient regarding her  knee arthritis.  She has had gel injections which have not really helped.  She is somewhat interested in a cortisone injection but after further discussion, she wants to hold off on injection for now.  She would like to try physical therapy upstairs for her knee and elbow.  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  She also describes left sided neck swelling that has been ongoing for about 6 months and has swelling that grows to about the size of a lemon based on her activity level.  No masses or lymphadenopathy is palpable on exam today.  Plan is to obtain CT scan or MRI scan of the soft tissue structures around the neck and  see if there are any concerning findings.  She has no history of cancer personally.  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  This is likely contributing to her knee pain but is also worth investigating on its own based on the amount of weakness she has.  She declined to work this up for now but when she comes back to meet Dr. August Saucer after physical therapy and neck scan, this would be a good topic to revisit.  Follow-Up Instructions: No follow-ups on file.   Orders:  Orders Placed This Encounter  Procedures   XR Elbow Complete Left (3+View)   MR NECK SOFT TISSUE ONLY W WO CONTRAST   Ambulatory referral to Physical Therapy   No orders of the defined types were placed in this encounter.     Procedures: No procedures performed   Clinical Data: No additional findings.  Objective: Vital Signs: There were no vitals taken for this visit.  Physical Exam:  Constitutional: Patient appears well-developed HEENT:  Head: Normocephalic Eyes:EOM are normal Neck: Normal range of motion Cardiovascular: Normal rate Pulmonary/chest: Effort normal Neurologic: Patient is alert Skin: Skin is warm Psychiatric: Patient has normal mood and affect  Ortho Exam: Ortho exam demonstrates left knee with trace effusion.  No pain with hip range of motion.  She does have moderate amount of quad atrophy of the left leg relative to the right leg.  Positive patellar grind test.  Mild tenderness over the medial joint line.  No tenderness over the lateral joint line.  She has range of motion from 0 degrees extension to greater than 90 degrees of knee flexion.  No cellulitis or skin changes noted over the knee region.  No calf tenderness.  Negative Homans' sign.  Cannot perform straight leg raise by herself without assistance in the left leg though she can perform straight leg raise easily in  the right leg.  Left elbow with no significant tenderness over the elbow joint.  She has intact elbow extension and flexion with near range of motion compared to the contralateral side; she lacks about 5 degrees elbow extension and 10 degrees elbow flexion..  No pain with pronation or supination passively.  She has bicep tendon that is palpable and intact.  Excellent bicep and tricep strength.  She does have moderate tenderness over the lateral epicondyle with pain in this region that is reproduced with resisted wrist extension and  with grip strength testing.  Neck exam of the anterior aspect of her neck demonstrates no mass that is palpable.  She does have a little bit of asymmetric swelling of the left neck.  There is no palpable lymphadenopathy.  Specialty Comments:  No specialty comments available.  Imaging: No results found.   PMFS History: Patient Active Problem List   Diagnosis Date Noted   Gout 08/01/2023   Back pain 07/29/2023   Vitamin B12 deficiency 07/29/2023   Macrocytic anemia 07/29/2023   Hypertensive emergency 07/24/2023   Hypokalemia 05/08/2019   Bell's palsy 05/08/2019   ICH (intracerebral hemorrhage) (HCC) 05/08/2019   Hypertensive crisis 10/15/2018   Hypertensive urgency 10/14/2018   Right ankle pain 10/14/2018   Past Medical History:  Diagnosis Date   Bell's palsy    Hypertension     Family History  Problem Relation Age of Onset   Hypertension Sister    Breast cancer Cousin    Diabetes Mellitus II Neg Hx    Diabetes Neg Hx     Past Surgical History:  Procedure Laterality Date   cyst removal from uterus and ovaries     TONSILLECTOMY     Social History   Occupational History   Not on file  Tobacco Use   Smoking status: Never   Smokeless tobacco: Never  Substance and Sexual Activity   Alcohol use: Yes    Alcohol/week: 1.0 standard drink of alcohol    Types: 1 Shots of liquor per week    Comment: Wine occasionally    Drug use: No   Sexual  activity: Not Currently

## 2023-09-25 ENCOUNTER — Ambulatory Visit: Payer: Medicaid Other | Admitting: Cardiology

## 2023-09-26 ENCOUNTER — Telehealth: Payer: Self-pay | Admitting: Nurse Practitioner

## 2023-09-26 NOTE — Telephone Encounter (Signed)
Referral Request - Has patient seen PCP for this complaint? No,because just routine *If NO, is insurance requiring patient see PCP for this issue before PCP can refer them? Referral for which specialty: colonoscopy Preferred provider/office: ? Reason for referral: routine colonoscopy

## 2023-09-27 ENCOUNTER — Encounter: Payer: Self-pay | Admitting: Surgical

## 2023-09-27 ENCOUNTER — Ambulatory Visit (INDEPENDENT_AMBULATORY_CARE_PROVIDER_SITE_OTHER): Payer: Medicaid Other | Admitting: Surgical

## 2023-09-27 DIAGNOSIS — M25522 Pain in left elbow: Secondary | ICD-10-CM | POA: Diagnosis not present

## 2023-09-27 DIAGNOSIS — M6281 Muscle weakness (generalized): Secondary | ICD-10-CM | POA: Diagnosis not present

## 2023-09-27 MED ORDER — CELECOXIB 100 MG PO CAPS: ORAL_CAPSULE | ORAL | 0 refills | Status: DC

## 2023-09-28 ENCOUNTER — Other Ambulatory Visit: Payer: Self-pay | Admitting: Nurse Practitioner

## 2023-09-28 DIAGNOSIS — Z1211 Encounter for screening for malignant neoplasm of colon: Secondary | ICD-10-CM

## 2023-09-29 ENCOUNTER — Encounter: Payer: Self-pay | Admitting: Surgical

## 2023-09-29 NOTE — Progress Notes (Signed)
Follow-up Office Visit Note   Patient: Dalana Dold           Date of Birth: Dec 08, 1966           MRN: [id number] Visit Date: 09/27/2023 Requested by: Claiborne Rigg, NP [address] [place],  [place] [address] PCP: Claiborne Rigg, NP  Subjective: Chief Complaint  Patient presents with   Left Elbow - Pain, Follow-up    HPI: Lanisha Safko is a 57 y.o. female who returns to the office for follow-up visit.    Plan at last visit was:  Plan: Patient is a 57 year old female who presents for evaluation of multiple joint complaints.  She has left knee and left elbow pain.  Left knee pain has been ongoing for years and she has recent radiographs from earlier this year demonstrating primarily patellofemoral arthritis with mild degenerative changes in the medial and lateral compartments.  We discussed the options available to patient regarding her knee arthritis.  She has had gel injections which have not really helped.  She is somewhat interested in a cortisone injection but after further discussion, she wants to hold off on injection for now.  She would like to try physical therapy upstairs for her knee and elbow.  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   She also describes left sided neck swelling that has been ongoing for about 6 months and has swelling that grows to about the size of a lemon based on her activity level.  No masses or lymphadenopathy is palpable on exam today.  Plan is to obtain CT scan or MRI scan of the soft tissue structures around the neck and see if there are any concerning findings.  She has no history of cancer personally.   However, in addition to the knee arthritis she does have some significant quad  atrophy of the left leg and really cannot perform straight leg raise without assistance.  This is likely contributing to her knee pain but is also worth investigating on its own based on the amount of weakness she has.  She declined to work this up for now but when she comes back to meet Dr. August Saucer after physical therapy and neck scan, this would be a good topic to revisit.  Since then, patient notes continued left elbow pain.  She is here to consider cortisone injection into the left elbow joint.  She has not had any further episodes of locking of the left elbow.  She does report that the hinged knee brace she received at her last visit for her left knee has provided excellent relief of her knee pain.  She takes meloxicam and Tylenol for her joint pains.  She has received a call to schedule the scan of her neck soft tissue for evidence of asymmetric swelling.              ROS: All systems reviewed are negative as they relate to the chief complaint within the history of present illness.  Patient denies fevers or chills.  Assessment & Plan: Visit Diagnoses:  1. Pain in left elbow   2. Weakness of both quadriceps muscles     Plan: Alexzandrea Mans is a 57 y.o.  female who returns to the office for follow-up visit.  Plan from last visit was noted above in HPI.  They now return with continued left elbow pain.  She was considering a cortisone injection but she is somewhat apprehensive about this after further discussion.  She would like to consider pressing forward with the physical therapy that she has scheduled and try different oral anti-inflammatory to see if this will be more helpful and see if she can avoid an injection since she has a fear of needles.  Celebrex was prescribed for her and she was counseled not to take any other NSAIDs.  Additionally, we discussed the fairly significant quad atrophy that she has in her left leg once again.  She has significant difficulty firing her quad or lifting her leg up  by itself without the use of her hands.  In order to help push up from a seated position she often requires to use her left arm which exacerbates her elbow pain.  She had ultrasound exam today demonstrating intact quadricep tendon with a small knee joint effusion.  The last fall she has had was about 6 years ago and she did not really have any imaging of her knee at that time but she states that the difficulties with lifting her leg started 2 years ago without incident.  With intact quad tendon and continued difficulty lifting her leg or firing her quad, plan to order nerve conduction study to evaluate quadricep weakness.  Patient agrees with this plan.  Follow-up after nerve conduction study.  Follow-Up Instructions: No follow-ups on file.   Orders:  Orders Placed This Encounter  Procedures   Ambulatory referral to Physical Medicine Rehab   Meds ordered this encounter  Medications   celecoxib (CELEBREX) 100 MG capsule    Sig: Take 1 PO BID    Dispense:  60 capsule    Refill:  0      Procedures: No procedures performed   Clinical Data: No additional findings.  Objective: Vital Signs: There were no vitals taken for this visit.  Physical Exam:  Constitutional: Patient appears well-developed HEENT:  Head: Normocephalic Eyes:EOM are normal Neck: Normal range of motion Cardiovascular: Normal rate Pulmonary/chest: Effort normal Neurologic: Patient is alert Skin: Skin is warm Psychiatric: Patient has normal mood and affect  Ortho Exam: Ortho exam demonstrates left elbow with 5 to 10 degree flexion contracture compared with the contralateral elbow which extends to 0 degrees.  Does have tenderness over the posterior recess of the elbow as well as over the lateral epicondyle.  There is no cellulitis or skin changes noted.  Intact tricep and bicep strength.  Intact EPL, FPL, finger abduction.    Left knee with trace effusion.  She has excellent quad strength of the right leg and quad  fails to significantly fire on evaluation of left leg.  She cannot lift her leg up to perform straight leg raise of the left leg without assistance from her arms.  Specialty Comments:  No specialty comments available.  Imaging: No results found.   PMFS History: Patient Active Problem List   Diagnosis Date Noted   Gout 08/01/2023   Back pain 07/29/2023   Vitamin B12 deficiency 07/29/2023   Macrocytic anemia 07/29/2023   Hypertensive emergency 07/24/2023   Hypokalemia 05/08/2019   Bell's palsy 05/08/2019   ICH (intracerebral hemorrhage) (HCC) 05/08/2019   Hypertensive crisis 10/15/2018   Hypertensive urgency 10/14/2018   Right ankle pain 10/14/2018   Past Medical History:  Diagnosis Date  Bell's palsy    Hypertension     Family History  Problem Relation Age of Onset   Hypertension Sister    Breast cancer Cousin    Diabetes Mellitus II Neg Hx    Diabetes Neg Hx     Past Surgical History:  Procedure Laterality Date   cyst removal from uterus and ovaries     TONSILLECTOMY     Social History   Occupational History   Not on file  Tobacco Use   Smoking status: Never   Smokeless tobacco: Never  Substance and Sexual Activity   Alcohol use: Yes    Alcohol/week: 1.0 standard drink of alcohol    Types: 1 Shots of liquor per week    Comment: Wine occasionally    Drug use: No   Sexual activity: Not Currently

## 2023-10-02 ENCOUNTER — Ambulatory Visit: Payer: Medicaid Other | Admitting: Surgical

## 2023-10-06 NOTE — Therapy (Deleted)
OUTPATIENT PHYSICAL THERAPY SHOULDER EVALUATION   Patient Name: Grace Young MRN: [id number] DOB:April 11, 1966, 57 y.o., female Today's Date: 10/06/2023  END OF SESSION:   Past Medical History:  Diagnosis Date   Bell's palsy    Hypertension    Past Surgical History:  Procedure Laterality Date   cyst removal from uterus and ovaries     TONSILLECTOMY     Patient Active Problem List   Diagnosis Date Noted   Gout 08/01/2023   Back pain 07/29/2023   Vitamin B12 deficiency 07/29/2023   Macrocytic anemia 07/29/2023   Hypertensive emergency 07/24/2023   Hypokalemia 05/08/2019   Bell's palsy 05/08/2019   ICH (intracerebral hemorrhage) (HCC) 05/08/2019   Hypertensive crisis 10/15/2018   Hypertensive urgency 10/14/2018   Right ankle pain 10/14/2018    PCP: Claiborne Rigg, NP   REFERRING PROVIDER: Julieanne Cotton, PA-C  REFERRING DIAG: [phone] (ICD-10-CM) - Pain in left elbow M25.562,G89.29 (ICD-10-CM) - Chronic pain of left knee  THERAPY DIAG:  No diagnosis found.  Rationale for Evaluation and Treatment: Rehabilitation  ONSET DATE: chronic  SUBJECTIVE:                                                                                                                                                                                      SUBJECTIVE STATEMENT: *** Hand dominance: {MISC; OT HAND [phone]}  PERTINENT HISTORY: HPI: Grace Young is a 57 y.o. female who presents to the office reporting multiple joint complaints.  Patient reports left knee pain that has been ongoing for 3 years.  No recent injury but states that she did have a fall about 6 years ago at her house.  She states that the knee has pain diffusely throughout the entire knee.  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  She does have stiffness with immobility and stiffness in the morning.  No history  of prior knee surgery.  No radiation of pain or any groin pain.  She has never had a cortisone injection into the knee but she has had gel injections prior.  Does not feel that the gel injections were any significant help.   In addition to the knee pain she also describes left elbow pain.  She has had elbow pain that has been ongoing for 2 to 3 weeks.  She feels that she has occasional popping sensation in the elbow and she will have very intense pain at times.  She has had 1 locking episode that does not sound like a true locking episode but more of a it  hurts so bad that I could not move my elbow.  She occasionally will have to push-up with her left arm due to the amount of knee pain she has and this will cause increased pain to the elbow.  No history of prior elbow surgery.  She does have history of gout as diagnosed by her PCP.  She is currently on allopurinol and takes occasional colchicine when she has gout flares.   In addition to these joint complaints she also notes a "lump" in the left side of her neck that is been present for 6 months.  She states that this "lump" will oscillate in size and gets up to about the size of a lemon at times.  This has been becoming more more prominence.  Gets worse when she is working or lifting things.  She denies any personal history of cancer.  No history of edematous swelling in the left arm.  No diabetes.  She does not smoke.Marland Kitchen   PAIN:  Are you having pain? {OPRCPAIN:27236}  PRECAUTIONS: None  RED FLAGS: None   WEIGHT BEARING RESTRICTIONS: No  FALLS:  Has patient fallen in last 6 months? No  OCCUPATION: chef  PLOF: Independent  PATIENT GOALS:***  NEXT MD VISIT:   OBJECTIVE:  Note: Objective measures were completed at Evaluation unless otherwise noted.  DIAGNOSTIC FINDINGS:  AP, oblique, lateral views of left elbow reviewed.  No fracture or  dislocation noted.  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  CLINICAL DATA:  Chronic knee pain, injured 2 years ago with persistent pain   EXAM: LEFT KNEE - COMPLETE 4+ VIEW   COMPARISON:  None Available.   FINDINGS: No fracture or dislocation of the left knee. Moderate patellofemoral compartment arthrosis with relatively preserved femorotibial compartments. Small knee joint effusion. Soft tissues unremarkable.   IMPRESSION: 1. No fracture or dislocation of the left knee. 2. Moderate patellofemoral compartment arthrosis with relatively preserved femorotibial compartments. 3. Small knee joint effusion.     Electronically Signed   By: Jearld Lesch M.D.   On: 05/19/2023 13:07  PATIENT SURVEYS:  FOTO ***  POSTURE: ***  UPPER EXTREMITY ROM:   {AROM/PROM:27142} ROM Right eval Left eval  Shoulder flexion    Shoulder extension    Shoulder abduction    Shoulder adduction    Shoulder internal rotation    Shoulder external rotation    Elbow flexion    Elbow extension    Wrist flexion    Wrist extension    Wrist ulnar deviation    Wrist radial deviation    Wrist pronation    Wrist supination    (Blank rows = not tested)  UPPER EXTREMITY MMT:  MMT Right eval Left eval  Shoulder flexion    Shoulder extension    Shoulder abduction    Shoulder adduction    Shoulder internal rotation    Shoulder external rotation    Middle trapezius    Lower trapezius    Elbow flexion    Elbow extension    Wrist flexion    Wrist extension    Wrist ulnar deviation    Wrist radial deviation    Wrist pronation    Wrist supination    Grip strength (lbs)    (Blank rows = not tested)  LOWER EXTREMITY MMT:    MMT Right eval Left eval  Hip flexion    Hip extension  Hip abduction    Hip adduction    Hip internal rotation    Hip external rotation    Knee flexion    Knee extension    Ankle dorsiflexion    Ankle plantarflexion    Ankle inversion     Ankle eversion     (Blank rows = not tested)   LOWER EXTREMITY ROM:     {AROM/PROM:27142}  Right eval Left eval  Hip flexion    Hip extension    Hip abduction    Hip adduction    Hip internal rotation    Hip external rotation    Knee flexion    Knee extension    Ankle dorsiflexion    Ankle plantarflexion    Ankle inversion    Ankle eversion     (Blank rows = not tested)  JOINT MOBILITY TESTING:  ***  PALPATION:  ***   TODAY'S TREATMENT:                                                                                                                                         DATE: ***   PATIENT EDUCATION: Education details: *** Person educated: {Person educated:25204} Education method: {Education Method:25205} Education comprehension: {Education Comprehension:25206}  HOME EXERCISE PROGRAM: ***  ASSESSMENT:  CLINICAL IMPRESSION: Patient is a *** y.o. *** who was seen today for physical therapy evaluation and treatment for ***.   OBJECTIVE IMPAIRMENTS: {opptimpairments:25111}.   ACTIVITY LIMITATIONS: {activitylimitations:27494}  PARTICIPATION LIMITATIONS: {participationrestrictions:25113}  PERSONAL FACTORS: {Personal factors:25162} are also affecting patient's functional outcome.   REHAB POTENTIAL: {rehabpotential:25112}  CLINICAL DECISION MAKING: {clinical decision making:25114}  EVALUATION COMPLEXITY: {Evaluation complexity:25115}   GOALS: Goals reviewed with patient? {yes/no:20286}  SHORT TERM GOALS: Target date: ***  *** Baseline: Goal status: INITIAL  2.  *** Baseline:  Goal status: INITIAL  3.  *** Baseline:  Goal status: INITIAL  4.  *** Baseline:  Goal status: INITIAL  5.  *** Baseline:  Goal status: INITIAL  6.  *** Baseline:  Goal status: INITIAL  LONG TERM GOALS: Target date: ***  *** Baseline:  Goal status: INITIAL  2.  *** Baseline:  Goal status: INITIAL  3.  *** Baseline:  Goal status: INITIAL  4.   *** Baseline:  Goal status: INITIAL  5.  *** Baseline:  Goal status: INITIAL  6.  *** Baseline:  Goal status: INITIAL  PLAN:  PT FREQUENCY: {rehab frequency:25116}  PT DURATION: {rehab duration:25117}  PLANNED INTERVENTIONS: {rehab planned interventions:25118::"97146- PT Re-evaluation","97110-Therapeutic exercises","97530- Therapeutic [id number]- Neuromuscular re-education","97535- Self FAOZ","30865- Manual therapy","Patient/Family education"}  PLAN FOR NEXT SESSION: ***   Hildred Laser, PT 10/06/2023, 7:52 PM

## 2023-10-07 ENCOUNTER — Ambulatory Visit: Payer: Medicaid Other

## 2023-10-10 ENCOUNTER — Encounter: Payer: Self-pay | Admitting: Neurology

## 2023-10-11 ENCOUNTER — Other Ambulatory Visit: Payer: Self-pay

## 2023-10-11 DIAGNOSIS — R202 Paresthesia of skin: Secondary | ICD-10-CM

## 2023-10-14 ENCOUNTER — Telehealth: Payer: Self-pay

## 2023-10-14 ENCOUNTER — Telehealth: Payer: Medicaid Other | Admitting: Physician Assistant

## 2023-10-14 ENCOUNTER — Telehealth: Payer: Self-pay | Admitting: Nurse Practitioner

## 2023-10-14 ENCOUNTER — Ambulatory Visit: Payer: Medicaid Other | Admitting: Surgical

## 2023-10-14 ENCOUNTER — Ambulatory Visit: Payer: Self-pay

## 2023-10-14 DIAGNOSIS — H538 Other visual disturbances: Secondary | ICD-10-CM

## 2023-10-14 DIAGNOSIS — H04121 Dry eye syndrome of right lacrimal gland: Secondary | ICD-10-CM

## 2023-10-14 NOTE — Telephone Encounter (Signed)
Chief Complaint: Eye discharge  Symptoms: Right eye discharge, redness Frequency: constant Pertinent Negatives: Patient denies pain, swelling Disposition: [] ED /[] Urgent Care (no appt availability in office) / [] Appointment(In office/virtual)/ [x]  [facility]/ [] Home Care/ [] Refused Recommended Disposition /[] [facility]/ []  Follow-up with PCP Additional Notes: Patient reports having continued eye drainage that is thick white mucus that covers the right eye. Patient states the right eye is red and irritated most of the time. Patient reports using eye drops in the past that were helpful but can not think of the name. Patient reports clear eyes eye drops are not helpful at all. Patient ws given care advice and due to no appointment availability in office patient was scheduled today with virtual UC.  Summary: Right Eye irritation, discharge   Right eye, irritation,  redness. Discharge from eye ,,, sometimes clear and sometimes white will cover pupil sometime and have to pull it out      Has been going on for a while, not going away. Patient has been using clear eyes, not working     Reason for Disposition  MODERATE eye pain (e.g., interferes with normal activities)  Answer Assessment - Initial Assessment Questions 1. EYE DISCHARGE: "Is the discharge in one or both eyes?" "What color is it?" "How much is there?" "When did the discharge start?"      One, Right eye 2. REDNESS OF SCLERA: "Is the redness in one or both eyes?" "When did the redness start?"      Redness, ongoing 3. EYELIDS: "Are the eyelids red or swollen?" If Yes, ask: "How much?"      No  4. VISION: "Is there any difficulty seeing clearly?"      Yes, blurred vision  5. PAIN: "Is there any pain? If Yes, ask: "How bad is it?" (Scale 1-10; or mild, moderate, severe)    - MILD (1-3): doesn't interfere with normal activities     - MODERATE (4-7): interferes with normal activities or awakens from sleep    - SEVERE  (8-10): excruciating pain, unable to do any normal activities       Mild discomfort  6. CONTACT LENS: "Do you wear contacts?"     No  7. OTHER SYMPTOMS: "Do you have any other symptoms?" (e.g., fever, runny nose, cough)     Irritation, redness and discharge  Protocols used: Eye - Pus or Discharge-A-AH

## 2023-10-14 NOTE — Patient Instructions (Addendum)
Holley Dexter, thank you for joining Margaretann Loveless, PA-C for today's virtual visit.  While this provider is not your primary care provider (PCP), if your PCP is located in our provider database this encounter information will be shared with them immediately following your visit.   A [facility] MyChart account gives you access to today's visit and all your visits, tests, and labs performed at [facility] " click here if you don't have a [facility] MyChart account or go to [email or web address]  Consent: (Patient) Grace Young provided verbal consent for this virtual visit at the beginning of the encounter.  Current Medications:  Current Outpatient Medications:    acetaminophen (TYLENOL) 500 MG tablet, Take 1,000 mg by mouth daily as needed for moderate pain., Disp: , Rfl:    allopurinol (ZYLOPRIM) 300 MG tablet, Take 1 tablet (300 mg total) by mouth daily., Disp: 90 tablet, Rfl: 1   amLODipine (NORVASC) 10 MG tablet, Take 1 tablet (10 mg total) by mouth daily., Disp: 90 tablet, Rfl: 0   carvedilol (COREG) 12.5 MG tablet, Take 1 tablet (12.5 mg total) by mouth 2 (two) times daily with a meal., Disp: 180 tablet, Rfl: 0   celecoxib (CELEBREX) 100 MG capsule, Take 1 PO BID, Disp: 60 capsule, Rfl: 0   colchicine 0.6 MG tablet, Take 2 tablets on day 1 then on day 2 take 1 tablet by mouth daily, Disp: 15 tablet, Rfl: 0   gabapentin (NEURONTIN) 300 MG capsule, Take 1 capsule (300 mg total) by mouth 2 (two) times daily., Disp: 60 capsule, Rfl: 3   losartan (COZAAR) 50 MG tablet, Take 1 tablet (50 mg total) by mouth daily., Disp: 90 tablet, Rfl: 0   meloxicam (MOBIC) 7.5 MG tablet, Take 1 tablet (7.5 mg total) by mouth daily., Disp: 30 tablet, Rfl: 1   Naphazoline HCl (CLEAR EYES OP), Place 3 drops into both eyes 3 (three) times daily as needed (irritation)., Disp: , Rfl:    Medications ordered in this encounter:  No orders of the defined types were placed in this encounter.     *If you need refills on other medications prior to your next appointment, please contact your pharmacy*  Follow-Up: Call back or seek an in-person evaluation if the symptoms worsen or if the condition fails to improve as anticipated.  [facility] [phone]  Other Instructions  [facility] [phone]   [facility] [phone]   [facility] [phone] and [phone] and [phone]  (3 locations)   [facility] [phone]   [facility] [phone]   [facility] [phone]   [place]   [facility] [phone]   [facility] [phone] or [phone]      Dry Eye  Dry eye, also called keratoconjunctivitis sicca, is a condition caused by dryness of the membranes surrounding the eye. It happens when there are not enough healthy, natural tears in the eyes. The eyes must remain moist at all times for good comfort and vision. A small amount of tears is constantly produced by the tear glands (lacrimal glands). These glands are mainly located under the outside part of the upper eyelids. The eyelids produce oils that coat the tears to keep them from evaporating quickly. If the eyelids are inflamed (blepharitis), the lack of healthy oils can make the dry eye worse. Dry eye can happen on its own or  be a symptom of several conditions, such as rheumatoid arthritis, lupus, or Sjgren's syndrome. Dry eye may be mild to severe. What are the causes? This condition may be caused by: Not making enough tears (aqueous tear-deficient dry eyes). Tears evaporating from the eyes too quickly (evaporative dry eyes). This is when there is an abnormality in the quality of your tears, especially the oils. This abnormality causes your tears to evaporate so quickly that the eyes cannot be kept moist. What increases the risk? You are  more likely to develop this condition if you: Are a woman, especially if you have gone through menopause. Are older. Live in a dry climate. Live or work in a dusty or smoky area. Take certain medicines, such as: Anti-allergy medicines (antihistamines). Blood pressure medicines (antihypertensives), especially "water pills" (diuretics). Birth control pills (oral contraceptives). Laxatives. Tranquilizers. Have eyelid inflammation (blepharitis). Have a history of refractive eye surgery, such as LASIK. Have a history of long-term contact lens use. What are the signs or symptoms? Symptoms of this condition include: Irritation. You may feel: Itchiness. Burning. A feeling as though something is stuck in the eye. Redness. Inflammation of the eyelids. Light sensitivity. Increased sensitivity and discomfort when wearing contact lenses. Vision that varies throughout the day. Occasional excessive tearing. How is this diagnosed? This condition is diagnosed based on your symptoms, your medical history, and an eye exam. Your health care provider may look at your eye using a microscope and may put dyes in your eye to check the health of the surface of your eye. You may have tests, such as a test to evaluate your tear production (Schirmer test). During this test: A small strip of special paper is gently pressed partly under your lower eyelid. Your tear production is measured by how much of the paper is moistened by your tears during a set amount of time. You may be referred to a health care provider who specializes in medical and surgical eye care (ophthalmologist). How is this treated? Treatment for this condition depends on the type and severity of the dry eyes. To help relieve your symptoms, your health care provider may recommend over-the-counter artificial tears. Artificial tears either come in bottles that have mild preservatives or in small vials or bottles without preservatives. Patients with  mild dry eye may do well with tears that have preservatives, while those with more severe dry eye should just use tears without preservatives. Note that one vial may be used several times a day, but should be discarded at the end of the day. If your condition is severe, treatment may also include: Prescription eye drops. Over-the-counter or prescription gels or ointments to moisten your eyes. A prescription nasal spray that increases tear production. Minor surgery to place plugs into the tear drainage ducts. This keep tears from exiting the eye so that tears can stay on the surface of the eye longer. Medicines to reduce inflammation of the eyelids. Taking an omega-3 fatty acid nutritional supplement. Other treatments include making tears from your own blood (autologous serum tears), wearing special contact lenses, and even having minor surgery to partially close the outer parts of your eyelids to decrease evaporation. Follow these instructions at home: Take or apply over-the-counter and prescription medicines only as told by your health care provider. This includes eye drops. If directed, apply a warm compress to your eyes to help reduce eyelid inflammation. Place a towel over your eyes and gently press the warm compress over your eyes for about 5 minutes, or  as long as told by your health care provider. Drink plenty of fluids to stay well hydrated. If possible, avoid dry, drafty environments. Wear sunglasses when outdoors to protect your eyes from the sun and wind. Use a humidifier at home to increase moisture in the air. Remember to blink often when reading or using the computer for long periods. If you wear contact lenses, remove them regularly to give your eyes a break. Always remove your contact lenses before sleeping. Have a yearly eye exam and vision test. Keep all follow-up visits. This is important. Contact a health care provider if: You have eye pain. You have pus-like fluid coming  from your eye. Your symptoms get worse or do not improve with treatment. Get help right away if: Your vision suddenly changes. Summary Dry eye is dryness of the membranes surrounding the eye. Dry eye can happen on its own or be a symptom of several conditions, such as rheumatoid arthritis, lupus, or Sjgren's syndrome. This condition is diagnosed based on your symptoms, your medical history, and an eye exam. Treatment for this condition depends on the type and severity of the dry eye. To help relieve your symptoms, your health care provider may recommend over-the-counter artificial tears. This information is not intended to replace advice given to you by your health care provider. Make sure you discuss any questions you have with your health care provider. Document Revised: 05/02/2021 Document Reviewed: 05/02/2021 Elsevier Patient Education  2024 [employer].    If you have been instructed to have an in-person evaluation today at a local Urgent Care facility, please use the link below. It will take you to a list of all of our available [facility], including address, phone number and hours of operation. Please do not delay care.  [facility]  If you or a family member do not have a primary care provider, use the link below to schedule a visit and establish care. When you choose a [facility] primary care physician or advanced practice provider, you gain a long-term partner in health. Find a Primary Care Provider  Learn more about [facility] in-office and virtual care options: [facility] - Get Care Now

## 2023-10-14 NOTE — Telephone Encounter (Signed)
Copied from CRM [id number]. Topic: Referral - Status >> Oct 14, 2023 12:15 PM Phill Myron wrote: Reason for CRM:  colonoscopy referral status

## 2023-10-14 NOTE — Telephone Encounter (Signed)
Noted  

## 2023-10-14 NOTE — Progress Notes (Signed)
Virtual Visit Consent   Grace Young, you are scheduled for a virtual visit with a [facility] provider today. Just as with appointments in the office, your consent must be obtained to participate. Your consent will be active for this visit and any virtual visit you may have with one of our providers in the next 365 days. If you have a MyChart account, a copy of this consent can be sent to you electronically.  As this is a virtual visit, video technology does not allow for your provider to perform a traditional examination. This may limit your provider's ability to fully assess your condition. If your provider identifies any concerns that need to be evaluated in person or the need to arrange testing (such as labs, EKG, etc.), we will make arrangements to do so. Although advances in technology are sophisticated, we cannot ensure that it will always work on either your end or our end. If the connection with a video visit is poor, the visit may have to be switched to a telephone visit. With either a video or telephone visit, we are not always able to ensure that we have a secure connection.  By engaging in this virtual visit, you consent to the provision of healthcare and authorize for your insurance to be billed (if applicable) for the services provided during this visit. Depending on your insurance coverage, you may receive a charge related to this service.  I need to obtain your verbal consent now. Are you willing to proceed with your visit today? Grace Young has provided verbal consent on 10/14/2023 for a virtual visit (video or telephone). Grace Loveless, PA-C  Date: 10/14/2023 2:21 PM  Virtual Visit via Video Note   I, Grace Young, connected with  Grace Young  ([id number], 08/23/1966) on 10/14/23 at  1:45 PM EDT by a video-enabled telemedicine application and verified that I am speaking with the correct person using two identifiers.  Location: Patient: Virtual Visit Location  Patient: Home Provider: Virtual Visit Location Provider: Home Office   I discussed the limitations of evaluation and management by telemedicine and the availability of in person appointments. The patient expressed understanding and agreed to proceed.    History of Present Illness: Grace Young is a 57 y.o. who identifies as a female who was assigned female at birth, and is being seen today for dry eye with discharge and blurry vision.  HPI: Conjunctivitis  The current episode started more than 2 weeks ago (2-3 years it has been going on and progressively worsening). The onset was gradual. The problem occurs frequently. The problem has been unchanged. The problem is mild. Nothing relieves the symptoms. Nothing aggravates the symptoms. Associated symptoms include decreased vision (blurred), eye itching, eye discharge and eye redness. Pertinent negatives include no fever, no double vision, no photophobia, no URI and no eye pain. The eye pain is mild. The right eye is affected. The eye pain is not associated with movement. The eyelid exhibits no abnormality.     Problems:  Patient Active Problem List   Diagnosis Date Noted   Gout 08/01/2023   Back pain 07/29/2023   Vitamin B12 deficiency 07/29/2023   Macrocytic anemia 07/29/2023   Hypertensive emergency 07/24/2023   Hypokalemia 05/08/2019   Bell's palsy 05/08/2019   ICH (intracerebral hemorrhage) (HCC) 05/08/2019   Hypertensive crisis 10/15/2018   Hypertensive urgency 10/14/2018   Right ankle pain 10/14/2018    Allergies: No Known Allergies Medications:  Current Outpatient Medications:    acetaminophen (TYLENOL)  500 MG tablet, Take 1,000 mg by mouth daily as needed for moderate pain., Disp: , Rfl:    allopurinol (ZYLOPRIM) 300 MG tablet, Take 1 tablet (300 mg total) by mouth daily., Disp: 90 tablet, Rfl: 1   amLODipine (NORVASC) 10 MG tablet, Take 1 tablet (10 mg total) by mouth daily., Disp: 90 tablet, Rfl: 0   carvedilol (COREG) 12.5  MG tablet, Take 1 tablet (12.5 mg total) by mouth 2 (two) times daily with a meal., Disp: 180 tablet, Rfl: 0   celecoxib (CELEBREX) 100 MG capsule, Take 1 PO BID, Disp: 60 capsule, Rfl: 0   colchicine 0.6 MG tablet, Take 2 tablets on day 1 then on day 2 take 1 tablet by mouth daily, Disp: 15 tablet, Rfl: 0   gabapentin (NEURONTIN) 300 MG capsule, Take 1 capsule (300 mg total) by mouth 2 (two) times daily., Disp: 60 capsule, Rfl: 3   losartan (COZAAR) 50 MG tablet, Take 1 tablet (50 mg total) by mouth daily., Disp: 90 tablet, Rfl: 0   meloxicam (MOBIC) 7.5 MG tablet, Take 1 tablet (7.5 mg total) by mouth daily., Disp: 30 tablet, Rfl: 1   Naphazoline HCl (CLEAR EYES OP), Place 3 drops into both eyes 3 (three) times daily as needed (irritation)., Disp: , Rfl:   Observations/Objective: Patient is well-developed, well-nourished in no acute distress.  Resting comfortably at home.  Head is normocephalic, atraumatic.  No labored breathing.  Speech is clear and coherent with logical content.  Patient is alert and oriented at baseline.    Assessment and Plan: 1. Dry eye of right side  2. Blurry vision, right eye  - Patient with chronic dry eye and blurry vision present and progressive over years - Patient admits to needing glasses for vision when she was a kid, but quit wearing  - No recent eye exam - Advised to follow up ASAP with an ophthalmologist for an eye exam, vision exam  Follow Up Instructions: I discussed the assessment and treatment plan with the patient. The patient was provided an opportunity to ask questions and all were answered. The patient agreed with the plan and demonstrated an understanding of the instructions.  A copy of instructions were sent to the patient via MyChart unless otherwise noted below.    The patient was advised to call back or seek an in-person evaluation if the symptoms worsen or if the condition fails to improve as anticipated.    Grace Loveless,  PA-C

## 2023-10-18 ENCOUNTER — Other Ambulatory Visit: Payer: Self-pay | Admitting: Nurse Practitioner

## 2023-10-18 DIAGNOSIS — Z1211 Encounter for screening for malignant neoplasm of colon: Secondary | ICD-10-CM

## 2023-10-19 ENCOUNTER — Other Ambulatory Visit: Payer: Self-pay | Admitting: Nurse Practitioner

## 2023-10-19 DIAGNOSIS — M109 Gout, unspecified: Secondary | ICD-10-CM

## 2023-11-03 ENCOUNTER — Ambulatory Visit
Admission: RE | Admit: 2023-11-03 | Discharge: 2023-11-03 | Disposition: A | Discharge status: Home / Self Care | Payer: Medicaid Other | Source: Ambulatory Visit | Attending: Surgical

## 2023-11-03 DIAGNOSIS — M542 Cervicalgia: Secondary | ICD-10-CM

## 2023-11-07 ENCOUNTER — Other Ambulatory Visit: Payer: Self-pay | Admitting: Surgical

## 2023-11-10 NOTE — Progress Notes (Deleted)
  Cardiology Office Note:  .   Date:  11/10/2023  ID:  Holley Dexter, DOB [birth year], MRN [id number] PCP: Claiborne Rigg, NP  [facility] Providers Cardiologist:  None { Click to update primary MD,subspecialty MD or APP then REFRESH:1}    No chief complaint on file.   Patient Profile: .     Grace Young is a *** 57 y.o. female *** with a PMH notable for hypertension, and Bell's palsy and chronic left-sided back pain who presents here for *** at the request of Prosperi, Christian H, *.    Javonte Achterberg was admitted to [facility] from July 31 to August 5 with hypertensive emergency.  CTA of the chest did not show any evidence of dissection or aneurysm.  She was admitted to the ICU started on a Cardene drip wall medication were restarted.  Chest pain workup negative.  Discharged on amlodipine 10 mg daily, Coreg 12.5 mg twice daily, losartan 50 mg daily.  Subjective  Discussed the use of AI scribe software for clinical note transcription with the patient, who gave verbal consent to proceed.  History of Present Illness         Cardiovascular ROS: {roscv:310661}  ROS:  Review of Systems - {ros master:310782}    Objective   Studies Reviewed: Marland Kitchen        ECHO 07/24/2023: EF 60 to 65%.  No RWMA.  Moderate concentric LVH.  Mild to moderate LA dilation.  Normal valves.  Normal RV. CATH: *** MONITOR: *** CT: ***  Risk Assessment/Calculations:     No BP recorded.  {Refresh Note OR Click here to enter BP  :1}***         Physical Exam:   VS:  There were no vitals taken for this visit.   Wt Readings from Last 3 Encounters:  07/29/23 237 lb 7 oz (107.7 kg)  05/19/23 251 lb 5.2 oz (114 kg)  02/25/22 251 lb 5.2 oz (114 kg)    GEN: Well nourished, well developed in no acute distress; *** NECK: No JVD; No carotid bruits CARDIAC: Normal S1, S2; RRR, no murmurs, rubs, gallops RESPIRATORY:  Clear to auscultation without rales, wheezing or rhonchi ; nonlabored, good air  movement. ABDOMEN: Soft, non-tender, non-distended EXTREMITIES:  No edema; No deformity      ASSESSMENT AND PLAN: .    Problem List Items Addressed This Visit   None    Assessment and Plan              {Are you ordering a CV Procedure (e.g. stress test, cath, DCCV, TEE, etc)?   Press F2        :[id number]}   Follow-Up: No follow-ups on file.  Total time spent: *** min spent with patient + *** min spent charting = *** min    Signed, Marykay Lex, MD, MS Bryan Lemma, M.D., M.S. Interventional Cardiologist  [facility]  Pager # [phone] Phone # [phone] [address] [place], [place] [address]

## 2023-11-12 ENCOUNTER — Encounter: Payer: Medicaid Other | Admitting: Neurology

## 2023-11-12 ENCOUNTER — Ambulatory Visit: Payer: Medicaid Other | Attending: Cardiology | Admitting: Cardiology

## 2023-11-15 ENCOUNTER — Ambulatory Visit: Payer: Medicaid Other | Admitting: Surgical

## 2023-11-19 ENCOUNTER — Ambulatory Visit (AMBULATORY_SURGERY_CENTER): Payer: Medicaid Other

## 2023-11-19 VITALS — Ht 68.0 in | Wt 230.0 lb

## 2023-11-19 DIAGNOSIS — Z1211 Encounter for screening for malignant neoplasm of colon: Secondary | ICD-10-CM

## 2023-11-19 MED ORDER — NA SULFATE-K SULFATE-MG SULF 17.5-3.13-1.6 GM/177ML PO SOLN
1.0000 | Freq: Once | ORAL | 0 refills | Status: DC
Start: 2023-11-19 — End: 2023-12-05

## 2023-11-19 NOTE — Progress Notes (Signed)

## 2023-12-05 ENCOUNTER — Other Ambulatory Visit: Payer: Self-pay

## 2023-12-05 ENCOUNTER — Telehealth: Payer: Self-pay | Admitting: Gastroenterology

## 2023-12-05 DIAGNOSIS — Z1211 Encounter for screening for malignant neoplasm of colon: Secondary | ICD-10-CM

## 2023-12-05 MED ORDER — NA SULFATE-K SULFATE-MG SULF 17.5-3.13-1.6 GM/177ML PO SOLN: 1.0000 | Freq: Once | ORAL | 0 refills | Status: AC

## 2023-12-05 MED ORDER — PEG 3350-KCL-NA BICARB-NACL 420 G PO SOLR
4000.0000 mL | Freq: Once | ORAL | 0 refills | Status: AC
Start: 2023-12-05 — End: 2023-12-05

## 2023-12-05 NOTE — Telephone Encounter (Signed)
Spoke with patient. New prep has been sent into [facility] pharmacy on [address]. Prep instructions reviewed with patient.

## 2023-12-05 NOTE — Addendum Note (Signed)
Addended by: Darylene Price on: 12/05/2023 04:17 PM   Modules accepted: Orders

## 2023-12-05 NOTE — Telephone Encounter (Signed)
Inbound call from patient, states she drank her all of her prep medications last night and this morning. Patient states she did not read the instructions correctly and starting taking it on 12/11 instead of starting her restricted diet. Patient would like a call to discuss further steps. Procedure is scheduled for 12/16.

## 2023-12-05 NOTE — Telephone Encounter (Signed)
Spoke with patient offered miralax prep. Pt did not think this would clean her out due to using miralax in the past for constipation with no results. Pt opted to for golytely. Prep instructions reviewed with patient and new copy sent to mychart. Rx sent to [facility] on file.

## 2023-12-05 NOTE — Telephone Encounter (Signed)
Patient called stated the new prep medication sent requires PA seeking an alternative.

## 2023-12-07 ENCOUNTER — Other Ambulatory Visit: Payer: Self-pay | Admitting: Family Medicine

## 2023-12-09 ENCOUNTER — Encounter: Payer: Medicaid Other | Admitting: Gastroenterology

## 2023-12-09 ENCOUNTER — Telehealth: Payer: Self-pay | Admitting: Gastroenterology

## 2023-12-09 NOTE — Telephone Encounter (Signed)
PT is calling to cancel colonoscopy for today at 1130am. She never began the prep process.

## 2023-12-24 ENCOUNTER — Encounter: Payer: Medicaid Other | Admitting: Neurology

## 2024-01-02 ENCOUNTER — Other Ambulatory Visit: Payer: Self-pay | Admitting: Surgical

## 2024-01-02 ENCOUNTER — Other Ambulatory Visit: Payer: Self-pay | Admitting: Nurse Practitioner

## 2024-01-20 ENCOUNTER — Encounter: Payer: Medicaid Other | Admitting: Neurology

## 2024-01-21 ENCOUNTER — Telehealth: Payer: Self-pay | Admitting: Gastroenterology

## 2024-01-21 MED ORDER — PEG 3350-KCL-NA BICARB-NACL 420 G PO SOLR: 4000.0000 mL | Freq: Once | ORAL | 0 refills | Status: AC

## 2024-01-21 NOTE — Telephone Encounter (Signed)
Called and spoke with patient- patient reports she "messed up and ate corn on the cobb for lunch today and has been taking her Celebrex as prescribed (BID) and has not held that medication; Patient requested that Golytely Rx be resent to the pharmacy and that she needs to be rescheduled for her procedure because she did not follow instructions (it appears the patient was not sent new updated with new date instructions for her procedure scheduled on 01/23/2024. New instructions sent, Rx sent in and all questions answered while on phone with patient;  Patient reports she will look over instructions via MyChart for procedure R/S on 01/29/2024; Patient advised to call back to the office at [phone] should questions/concerns arise; Patient verbalized understanding of information/instructions;

## 2024-01-21 NOTE — Telephone Encounter (Signed)
Patient called stating she has not received Suprep medication to be sent to [facility] for upcoming colonoscopy 01/23/2024

## 2024-01-23 ENCOUNTER — Encounter: Payer: Medicaid Other | Admitting: Gastroenterology

## 2024-01-29 ENCOUNTER — Encounter: Payer: Medicaid Other | Admitting: Gastroenterology

## 2024-01-29 ENCOUNTER — Other Ambulatory Visit: Payer: Self-pay | Admitting: Surgical

## 2024-01-29 ENCOUNTER — Telehealth: Payer: Self-pay | Admitting: Gastroenterology

## 2024-01-29 NOTE — Telephone Encounter (Signed)
 Good Afternoon Dr Marylynn Soho pt @1 :50p to confirm arrival for procedure and received no answer.  Left voicemail.

## 2024-02-26 ENCOUNTER — Ambulatory Visit: Payer: Medicaid Other | Admitting: Surgical

## 2024-02-28 ENCOUNTER — Ambulatory Visit: Payer: Self-pay | Admitting: Nurse Practitioner

## 2024-02-28 ENCOUNTER — Telehealth: Admitting: Physician Assistant

## 2024-02-28 DIAGNOSIS — M2550 Pain in unspecified joint: Secondary | ICD-10-CM | POA: Diagnosis not present

## 2024-02-28 DIAGNOSIS — M109 Gout, unspecified: Secondary | ICD-10-CM

## 2024-02-28 MED ORDER — CYCLOBENZAPRINE HCL 10 MG PO TABS: 10.0000 mg | ORAL_TABLET | Freq: Three times a day (TID) | ORAL | 0 refills | Status: AC | PRN

## 2024-02-28 MED ORDER — PREDNISONE 20 MG PO TABS: 40.0000 mg | ORAL_TABLET | Freq: Every day | ORAL | 0 refills | Status: AC

## 2024-02-28 NOTE — Telephone Encounter (Signed)
  Chief Complaint: shoulder pain Symptoms: shoulder pain, arm swelling Frequency: about  Pertinent Negatives: Patient denies fever, cool extremity, long trips by plane/car, redness Disposition: [] ED /[] Urgent Care (no appt availability in office) / [] Appointment(In office/virtual)/ [x]  [facility]/ [] Home Care/ [] Refused Recommended Disposition /[] [place] [facility]/ []  Follow-up with PCP Additional Notes: Pt states that she has been having bilateral shoulder pain for 3 weeks since lifting a car battery. Pt denies issues/disease with the shoulders, no surgery. Pt states that initilaly it was both shoulders, now the R arm has become swollen and states that she has limited ROM. Pt states that she has never had this before. Denies hx of carpel tunnel, but states + gout hx. Pt states pain 101. Denies clotting hx, and denies long trips via plane/car. Attempted to schedule with PCPs throughout [facility], no availability today. Pt scheduled with virtual UC with [facility]. Pt advised of care instructions per Epic. Pt states that she cannot drive or use her arm enough to turn the key in the ignition.   Copied from CRM [id number]. Topic: Clinical - Red Word Triage >> Feb 28, 2024  3:47 PM Martha Clan wrote: Red Word that prompted transfer to Nurse Triage: Extreme pain, swelling in hands, shoulder pain bilateral, Loss of RoM in both arms bilateral, more so right arm. Reason for Disposition  [1] SEVERE pain AND [2] not improved 2 hours after pain medicine/ice packs  Answer Assessment - Initial Assessment Questions 1. MECHANISM: "How did the injury happen?"     Lifting car battery 2. ONSET: "When did the injury happen?" (Minutes or hours ago)      3 weeks ago 3. APPEARANCE of INJURY: "What does the injury look like?"      Swelling in the arm 4. SEVERITY: "Can you move the shoulder normally?"      Limited ROM, feels like she can't lift it and pain is intense 5. SIZE: For cuts, bruises, or swelling, ask: "How large  is it?" (e.g., inches or centimeters;  entire joint)      denies 6. PAIN: "Is there pain?" If Yes, ask: "How bad is the pain?"   (e.g., Scale 1-10; or mild, moderate, severe)   - MILD (1-3): doesn't interfere with normal activities   - MODERATE (4-7): interferes with normal activities (e.g., work or school) or awakens from sleep   - SEVERE (8-10): excruciating pain, unable to do any normal activities, unable to move arm at all due to pain     10 8. OTHER SYMPTOMS: "Do you have any other symptoms?" (e.g., loss of sensation)     Feels like hand is being stuck by needles, numb, swollen, painful, tender  Protocols used: Shoulder Injury-A-AH

## 2024-02-28 NOTE — Patient Instructions (Signed)
 Holley Dexter, thank you for joining Tylene Fantasia Ward, PA-C for today's virtual visit.  While this provider is not your primary care provider (PCP), if your PCP is located in our provider database this encounter information will be shared with them immediately following your visit.   A [place] [facility] MyChart account gives you access to today's visit and all your visits, tests, and labs performed at [facility] " click here if you don't have a [facility] MyChart account or go to [email or web address]  Consent: (Patient) Grace Young provided verbal consent for this virtual visit at the beginning of the encounter.  Current Medications:  Current Outpatient Medications:    cyclobenzaprine (FLEXERIL) 10 MG tablet, Take 1 tablet (10 mg total) by mouth 3 (three) times daily as needed for up to 10 days for muscle spasms., Disp: 30 tablet, Rfl: 0   predniSONE (DELTASONE) 20 MG tablet, Take 2 tablets (40 mg total) by mouth daily with breakfast for 5 days., Disp: 10 tablet, Rfl: 0   acetaminophen (TYLENOL) 500 MG tablet, Take 1,000 mg by mouth daily as needed for moderate pain., Disp: , Rfl:    allopurinol (ZYLOPRIM) 300 MG tablet, Take 1 tablet (300 mg total) by mouth daily., Disp: 90 tablet, Rfl: 1   carvedilol (COREG) 12.5 MG tablet, Take 1 tablet (12.5 mg total) by mouth 2 (two) times daily with a meal., Disp: 180 tablet, Rfl: 0   celecoxib (CELEBREX) 100 MG capsule, Take 1 capsule by mouth twice daily, Disp: 60 capsule, Rfl: 0   colchicine 0.6 MG tablet, TAKE 2 TABLETS BY MOUTH ON DAY 1, THEN TAKE 1 TABLET BY MOUTH ONCE DAILY (Patient not taking: Reported on 11/19/2023), Disp: 15 tablet, Rfl: 0   gabapentin (NEURONTIN) 300 MG capsule, Take 1 capsule (300 mg total) by mouth 2 (two) times daily., Disp: 180 capsule, Rfl: 1   losartan (COZAAR) 50 MG tablet, Take 1 tablet (50 mg total) by mouth daily. (Patient not taking: Reported on 11/19/2023), Disp: 90 tablet, Rfl: 0   Naphazoline HCl (CLEAR  EYES OP), Place 3 drops into both eyes 3 (three) times daily as needed (irritation). (Patient not taking: Reported on 11/19/2023), Disp: , Rfl:    Medications ordered in this encounter:  Meds ordered this encounter  Medications   predniSONE (DELTASONE) 20 MG tablet    Sig: Take 2 tablets (40 mg total) by mouth daily with breakfast for 5 days.    Dispense:  10 tablet    Refill:  0    Supervising Provider:   Merrilee Jansky [[id number]]   cyclobenzaprine (FLEXERIL) 10 MG tablet    Sig: Take 1 tablet (10 mg total) by mouth 3 (three) times daily as needed for up to 10 days for muscle spasms.    Dispense:  30 tablet    Refill:  0    Supervising Provider:   Merrilee Jansky [[id number]]     *If you need refills on other medications prior to your next appointment, please contact your pharmacy*  Follow-Up: Call back or seek an in-person evaluation if the symptoms worsen or if the condition fails to improve as anticipated.  [facility] [phone]  Other Instructions Take prednisone as prescribed.  Hold Celebrex while taking this.  Can take Flexeril as needed for muscle spasm.  Can continue with Tylenol and Gabapentin.  Recommend follow up with you primary care physician.    If you have been instructed to have an in-person evaluation today at a local  Urgent Care facility, please use the link below. It will take you to a list of all of our available [facility], including address, phone number and hours of operation. Please do not delay care.  [facility]  If you or a family member do not have a primary care provider, use the link below to schedule a visit and establish care. When you choose a [facility] primary care physician or advanced practice provider, you gain a long-term partner in health. Find a Primary Care Provider  Learn more about [facility]'s in-office and virtual care options: [facility] - Get Care Now

## 2024-02-28 NOTE — Progress Notes (Signed)
 Virtual Visit Consent   Grace Young, you are scheduled for a virtual visit with a [facility] provider today. Just as with appointments in the office, your consent must be obtained to participate. Your consent will be active for this visit and any virtual visit you may have with one of our providers in the next 365 days. If you have a MyChart account, a copy of this consent can be sent to you electronically.  As this is a virtual visit, video technology does not allow for your provider to perform a traditional examination. This may limit your provider's ability to fully assess your condition. If your provider identifies any concerns that need to be evaluated in person or the need to arrange testing (such as labs, EKG, etc.), we will make arrangements to do so. Although advances in technology are sophisticated, we cannot ensure that it will always work on either your end or our end. If the connection with a video visit is poor, the visit may have to be switched to a telephone visit. With either a video or telephone visit, we are not always able to ensure that we have a secure connection.  By engaging in this virtual visit, you consent to the provision of healthcare and authorize for your insurance to be billed (if applicable) for the services provided during this visit. Depending on your insurance coverage, you may receive a charge related to this service.  I need to obtain your verbal consent now. Are you willing to proceed with your visit today? Grace Young has provided verbal consent on 02/28/2024 for a virtual visit (video or telephone). Grace Fantasia Ward, PA-C  Date: 02/28/2024 7:42 PM   Virtual Visit via Video Note   I, Grace Young, connected with  Grace Young  ([id number], Sep 15, 1966) on 02/28/24 at  7:30 PM EST by a video-enabled telemedicine application and verified that I am speaking with the correct person using two identifiers.  Location: Patient: Virtual Visit Location Patient:  Home Provider: Virtual Visit Location Provider: Home Office   I discussed the limitations of evaluation and management by telemedicine and the availability of in person appointments. The patient expressed understanding and agreed to proceed.    History of Present Illness: Grace Young is a 58 y.o. who identifies as a female who was assigned female at birth, and is being seen today for right knee pain, bilateral shoulder pain, and right hand pain that started about three weeks ago.  She reports three weeks ago she lifted a heavy car battery, reports several days after this she began experiencing bilateral shoulder pain. Reports hard to lift overhead due to shoulder pain. She is currently taking Tylenol, Celebrex, and Gabapentin.  Pt is tearful on exam, reports trouble sleeping due to pain, trouble getting up stairs due to pain.   HPI: HPI  Problems:  Patient Active Problem List   Diagnosis Date Noted   Gout 08/01/2023   Back pain 07/29/2023   Vitamin B12 deficiency 07/29/2023   Macrocytic anemia 07/29/2023   Hypertensive emergency 07/24/2023   Hypokalemia 05/08/2019   Bell's palsy 05/08/2019   ICH (intracerebral hemorrhage) (HCC) 05/08/2019   Hypertensive crisis 10/15/2018   Hypertensive urgency 10/14/2018   Right ankle pain 10/14/2018    Allergies: No Known Allergies Medications:  Current Outpatient Medications:    cyclobenzaprine (FLEXERIL) 10 MG tablet, Take 1 tablet (10 mg total) by mouth 3 (three) times daily as needed for up to 10 days for muscle spasms., Disp: 30 tablet, Rfl: 0  predniSONE (DELTASONE) 20 MG tablet, Take 2 tablets (40 mg total) by mouth daily with breakfast for 5 days., Disp: 10 tablet, Rfl: 0   acetaminophen (TYLENOL) 500 MG tablet, Take 1,000 mg by mouth daily as needed for moderate pain., Disp: , Rfl:    allopurinol (ZYLOPRIM) 300 MG tablet, Take 1 tablet (300 mg total) by mouth daily., Disp: 90 tablet, Rfl: 1   carvedilol (COREG) 12.5 MG tablet, Take 1 tablet  (12.5 mg total) by mouth 2 (two) times daily with a meal., Disp: 180 tablet, Rfl: 0   celecoxib (CELEBREX) 100 MG capsule, Take 1 capsule by mouth twice daily, Disp: 60 capsule, Rfl: 0   colchicine 0.6 MG tablet, TAKE 2 TABLETS BY MOUTH ON DAY 1, THEN TAKE 1 TABLET BY MOUTH ONCE DAILY (Patient not taking: Reported on 11/19/2023), Disp: 15 tablet, Rfl: 0   gabapentin (NEURONTIN) 300 MG capsule, Take 1 capsule (300 mg total) by mouth 2 (two) times daily., Disp: 180 capsule, Rfl: 1   losartan (COZAAR) 50 MG tablet, Take 1 tablet (50 mg total) by mouth daily. (Patient not taking: Reported on 11/19/2023), Disp: 90 tablet, Rfl: 0   Naphazoline HCl (CLEAR EYES OP), Place 3 drops into both eyes 3 (three) times daily as needed (irritation). (Patient not taking: Reported on 11/19/2023), Disp: , Rfl:   Observations/Objective: Patient is well-developed, well-nourished in no acute distress.  Resting comfortably at home.  Head is normocephalic, atraumatic.  No labored breathing.  Speech is clear and coherent with logical content.  Patient is alert and oriented at baseline.    Assessment and Plan: 1. Pain in joint, multiple sites (Primary)  Will send in a course of prednisone and a muscle relaxer.  Advised pt to contact PCP on Monday for further evaluation.   Follow Up Instructions: I discussed the assessment and treatment plan with the patient. The patient was provided an opportunity to ask questions and all were answered. The patient agreed with the plan and demonstrated an understanding of the instructions.  A copy of instructions were sent to the patient via MyChart unless otherwise noted below.     The patient was advised to call back or seek an in-person evaluation if the symptoms worsen or if the condition fails to improve as anticipated.    Grace Fantasia Ward, PA-C

## 2024-03-06 ENCOUNTER — Other Ambulatory Visit: Payer: Self-pay | Admitting: Surgical

## 2024-03-23 ENCOUNTER — Ambulatory Visit: Payer: Self-pay

## 2024-03-23 ENCOUNTER — Other Ambulatory Visit: Payer: Self-pay | Admitting: Nurse Practitioner

## 2024-03-23 DIAGNOSIS — M109 Gout, unspecified: Secondary | ICD-10-CM

## 2024-03-23 MED ORDER — COLCHICINE 0.6 MG PO TABS: ORAL_TABLET | ORAL | 0 refills | Status: DC

## 2024-03-23 NOTE — Telephone Encounter (Signed)
 Patient identified by name and date of birth.  Patient is aware of provider response and voiced understanding.

## 2024-03-23 NOTE — Telephone Encounter (Signed)
 Colchicine sent to [facility]. The medication for gout should be in adjunct with a healthy diet.

## 2024-03-23 NOTE — Telephone Encounter (Signed)
 Chief Complaint: Pain and swelling in arms, wrists, thumb Symptoms: see above Frequency: worsened recently  Pertinent Negatives: Patient denies n/a Disposition: [] ED /[] Urgent Care (no appt availability in office) / [x] Appointment(In office/virtual)/ []  [facility]/ [] Home Care/ [] Refused Recommended Disposition /[] [employer] [facility]/ []  Follow-up with PCP Additional Notes: Patient called stating she is having a worsened flare up of gout and neuropathy in her arms, wrist, and is having swelling in her right wrist and thumb. Patient states she is taking Allopurinol and Gabapentin daily as prescribed but not finding any relief. Patient is curious if she needs further pain management or referral. Patient states she doubled up on Gabapentin but no relief. Patient states her wrists are bothering her so bad she cannot drive - virtual appt made for earliest available slot for PCP. Patient would like call back if this can be moved sooner.   Copied from CRM [id number]. Topic: Clinical - Red Word Triage >> Mar 23, 2024 10:53 AM Franchot Heidelberg wrote: Red Word that prompted transfer to Nurse Triage: Severe pain and swelling. In multiple areas of her body Reason for Disposition  Arm pain is a chronic symptom (recurrent or ongoing AND present > 4 weeks)  Answer Assessment - Initial Assessment Questions 1. ONSET: "When did the pain start?"     Week 2. LOCATION: "Where is the pain located?"     Both arms, hands, and wrists 3. PAIN: "How bad is the pain?" (Scale 1-10; or mild, moderate, severe)   - MILD (1-3): Doesn't interfere with normal activities.   - MODERATE (4-7): Interferes with normal activities (e.g., work or school) or awakens from sleep.   - SEVERE (8-10): Excruciating pain, unable to do any normal activities, unable to hold a cup of water.     Worsens with movements, it goes to 10 4. WORK OR EXERCISE: "Has there been any recent work or exercise that involved this part of the body?"      No 5. CAUSE: "What do you think is causing the arm pain?"     Gout or Neuropathy 6. OTHER SYMPTOMS: "Do you have any other symptoms?" (e.g., neck pain, swelling, rash, fever, numbness, weakness)     Thumb and wrist swollen on right hand  Protocols used: Arm Pain-A-AH

## 2024-03-25 ENCOUNTER — Ambulatory Visit: Payer: Self-pay | Attending: Nurse Practitioner | Admitting: Nurse Practitioner

## 2024-04-01 ENCOUNTER — Telehealth: Admitting: Nurse Practitioner

## 2024-04-06 ENCOUNTER — Other Ambulatory Visit: Payer: Self-pay | Admitting: Surgical

## 2024-04-14 ENCOUNTER — Telehealth: Admitting: Physician Assistant

## 2024-04-14 DIAGNOSIS — M2540 Effusion, unspecified joint: Secondary | ICD-10-CM

## 2024-04-14 MED ORDER — PREDNISONE 10 MG PO TABS
ORAL_TABLET | ORAL | 0 refills | Status: AC
Start: 2024-04-14 — End: 2024-04-25

## 2024-04-14 NOTE — Progress Notes (Signed)
 Virtual Visit Consent   Grace Young, you are scheduled for a virtual visit with a [facility] provider today. Just as with appointments in the office, your consent must be obtained to participate. Your consent will be active for this visit and any virtual visit you may have with one of our providers in the next 365 days. If you have a MyChart account, a copy of this consent can be sent to you electronically.  As this is a virtual visit, video technology does not allow for your provider to perform a traditional examination. This may limit your provider's ability to fully assess your condition. If your provider identifies any concerns that need to be evaluated in person or the need to arrange testing (such as labs, EKG, etc.), we will make arrangements to do so. Although advances in technology are sophisticated, we cannot ensure that it will always work on either your end or our end. If the connection with a video visit is poor, the visit may have to be switched to a telephone visit. With either a video or telephone visit, we are not always able to ensure that we have a secure connection.  By engaging in this virtual visit, you consent to the provision of healthcare and authorize for your insurance to be billed (if applicable) for the services provided during this visit. Depending on your insurance coverage, you may receive a charge related to this service.  I need to obtain your verbal consent now. Are you willing to proceed with your visit today? Grace Young has provided verbal consent on 04/14/2024 for a virtual visit (video or telephone). Grace Young, [place]  Date: 04/14/2024 6:56 PM   Virtual Visit via Video Note   I, Grace Young, connected with  Grace Young  ([id number], 07-05-1966) on 04/14/24 at  6:45 PM EDT by a video-enabled telemedicine application and verified that I am speaking with the correct person using two identifiers.  Location: Patient: Virtual Visit Location  Patient: Home Provider: Virtual Visit Location Provider: Home Office   I discussed the limitations of evaluation and management by telemedicine and the availability of in person appointments. The patient expressed understanding and agreed to proceed.    History of Present Illness: Grace Young is a 58 y.o. who identifies as a female who was assigned female at birth, and is being seen today for recurring/ongoing issue with stiffness and swelling of multiple joints. Patient with history of gout and osteoarthritis. Notes having a prior negative workup for RA. Is currently on allopurinol  300 mg daily for gout prevention. Is also trying to stay hydrated and limit purine intake. Notes over the past week with increased swelling and stiffness of neck and shoulders bilaterally, causing pain with elevation of arm. Sometimes stiffness if so significant that she has limited ROM. Notes R side is worse than left but symptoms are bilateral. Also with substantial swelling of R hand which is a recurring issue. Notes previously was mainly her thumb and index but is now her 3rd-5th fingers with 3rd finger being the most swollen. Denies fever, chills, aches.   HPI: HPI  Problems:  Patient Active Problem List   Diagnosis Date Noted   Gout 08/01/2023   Back pain 07/29/2023   Vitamin B12 deficiency 07/29/2023   Macrocytic anemia 07/29/2023   Hypertensive emergency 07/24/2023   Hypokalemia 05/08/2019   Bell's palsy 05/08/2019   ICH (intracerebral hemorrhage) (HCC) 05/08/2019   Hypertensive crisis 10/15/2018   Hypertensive urgency 10/14/2018   Right ankle pain  10/14/2018    Allergies: No Known Allergies Medications:  Current Outpatient Medications:    predniSONE  (DELTASONE ) 10 MG tablet, Take 4 tablets (40 mg total) by mouth daily with breakfast for 3 days, THEN 3 tablets (30 mg total) daily with breakfast for 3 days, THEN 2 tablets (20 mg total) daily with breakfast for 2 days, THEN 1 tablet (10 mg total) daily  with breakfast for 3 days., Disp: 28 tablet, Rfl: 0   acetaminophen  (TYLENOL ) 500 MG tablet, Take 1,000 mg by mouth daily as needed for moderate pain., Disp: , Rfl:    allopurinol  (ZYLOPRIM ) 300 MG tablet, Take 1 tablet (300 mg total) by mouth daily., Disp: 90 tablet, Rfl: 1   colchicine  0.6 MG tablet, TAKE 2 TABLETS BY MOUTH ON DAY 1, THEN TAKE 1 TABLET BY MOUTH ONCE DAILY, Disp: 15 tablet, Rfl: 0   gabapentin  (NEURONTIN ) 300 MG capsule, Take 1 capsule (300 mg total) by mouth 2 (two) times daily., Disp: 180 capsule, Rfl: 1  Observations/Objective: Patient is well-developed, well-nourished in no acute distress.  Resting comfortably at home.  Head is normocephalic, atraumatic.  No labored breathing. Speech is clear and coherent with logical content.  Patient is alert and oriented at baseline.  She demonstrates normal ROM of shoulders bilaterally but with visible stiffness. R hand examined with swelling of 3rd-5th MCP and phalanges. No visible bruising. No swelling of forearm, wrist or elbow noted.  Assessment and Plan: 1. Swelling of multiple joints (Primary) - predniSONE  (DELTASONE ) 10 MG tablet; Take 4 tablets (40 mg total) by mouth daily with breakfast for 3 days, THEN 3 tablets (30 mg total) daily with breakfast for 3 days, THEN 2 tablets (20 mg total) daily with breakfast for 2 days, THEN 1 tablet (10 mg total) daily with breakfast for 3 days.  Dispense: 28 tablet; Refill: 0  She needs a further assessment as high concern for AI condition. She has history of gout and needs uric acid level recheck, but would benefit from further rheumatological assessment. Supportive measures and OTC medications reviewed. Giving extent of swelling and stiffness will start a prednisone  taper. PCP follow-up ASAP as discussed.   Follow Up Instructions: I discussed the assessment and treatment plan with the patient. The patient was provided an opportunity to ask questions and all were answered. The patient  agreed with the plan and demonstrated an understanding of the instructions.  A copy of instructions were sent to the patient via MyChart unless otherwise noted below.   The patient was advised to call back or seek an in-person evaluation if the symptoms worsen or if the condition fails to improve as anticipated.    Grace Maillard, PA-C

## 2024-04-14 NOTE — Patient Instructions (Signed)
  Grace Young, thank you for joining [name], PA-C for today's virtual visit.  While this provider is not your primary care provider (PCP), if your PCP is located in our provider database this encounter information will be shared with them immediately following your visit.   A [facility] MyChart account gives you access to today's visit and all your visits, tests, and labs performed at [facility] " click here if you don't have a [name] account or go to [email or web address]  Consent: (Patient) Grace Young provided verbal consent for this virtual visit at the beginning of the encounter.  Current Medications:  Current Outpatient Medications:    acetaminophen  (TYLENOL ) 500 MG tablet, Take 1,000 mg by mouth daily as needed for moderate pain., Disp: , Rfl:    allopurinol  (ZYLOPRIM ) 300 MG tablet, Take 1 tablet (300 mg total) by mouth daily., Disp: 90 tablet, Rfl: 1   celecoxib  (CELEBREX ) 100 MG capsule, Take 1 capsule by mouth twice daily, Disp: 60 capsule, Rfl: 0   colchicine  0.6 MG tablet, TAKE 2 TABLETS BY MOUTH ON DAY 1, THEN TAKE 1 TABLET BY MOUTH ONCE DAILY, Disp: 15 tablet, Rfl: 0   gabapentin  (NEURONTIN ) 300 MG capsule, Take 1 capsule (300 mg total) by mouth 2 (two) times daily., Disp: 180 capsule, Rfl: 1   Medications ordered in this encounter:  No orders of the defined types were placed in this encounter.    *If you need refills on other medications prior to your next appointment, please contact your pharmacy*  Follow-Up: Call back or seek an in-person evaluation if the symptoms worsen or if the condition fails to improve as anticipated.  [facility] [phone]  Other Instructions Please stay hydrated. Continue to limit purines giving your history of gout. Take the prednisone  taper as directed. You need to follow-up with your PCP for further workup and possible referral to Rheumatology.  I will send her a message  regarding this, but you need to reach out as well.    If you have been instructed to have an in-person evaluation today at a local Urgent Care facility, please use the link below. It will take you to a list of all of our available [facility], including address, phone number and hours of operation. Please do not delay care.  [facility]  If you or a family member do not have a primary care provider, use the link below to schedule a visit and establish care. When you choose a [facility] primary care physician or advanced practice provider, you gain a long-term partner in health. Find a Primary Care Provider  Learn more about [facility]'s in-office and virtual care options:  - Get Care Now

## 2024-04-23 ENCOUNTER — Telehealth: Admitting: Physician Assistant

## 2024-04-23 DIAGNOSIS — M25441 Effusion, right hand: Secondary | ICD-10-CM

## 2024-04-23 DIAGNOSIS — R200 Anesthesia of skin: Secondary | ICD-10-CM

## 2024-04-23 NOTE — Progress Notes (Signed)
 Virtual Visit Consent   Grace Young, you are scheduled for a virtual visit with a [facility] provider today. Just as with appointments in the office, your consent must be obtained to participate. Your consent will be active for this visit and any virtual visit you may have with one of our providers in the next 365 days. If you have a MyChart account, a copy of this consent can be sent to you electronically.  As this is a virtual visit, video technology does not allow for your provider to perform a traditional examination. This may limit your provider's ability to fully assess your condition. If your provider identifies any concerns that need to be evaluated in person or the need to arrange testing (such as labs, EKG, etc.), we will make arrangements to do so. Although advances in technology are sophisticated, we cannot ensure that it will always work on either your end or our end. If the connection with a video visit is poor, the visit may have to be switched to a telephone visit. With either a video or telephone visit, we are not always able to ensure that we have a secure connection.  By engaging in this virtual visit, you consent to the provision of healthcare and authorize for your insurance to be billed (if applicable) for the services provided during this visit. Depending on your insurance coverage, you may receive a charge related to this service.  I need to obtain your verbal consent now. Are you willing to proceed with your visit today? Grace Young has provided verbal consent on 04/23/2024 for a virtual visit (video or telephone). Grace Kelp, PA-C  Date: 04/23/2024 2:20 PM   Virtual Visit via Video Note   I, Grace Young, connected with  Grace Young  ([id number], [birth year]) on 04/23/24 at  2:15 PM EDT by a video-enabled telemedicine application and verified that I am speaking with the correct person using two identifiers.  Location: Patient: Virtual Visit Location Patient:  Home Provider: Virtual Visit Location Provider: Home Office   I discussed the limitations of evaluation and management by telemedicine and the availability of in person appointments. The patient expressed understanding and agreed to proceed.    History of Present Illness: Nasheema Cordoba is a 58 y.o. who identifies as a female who was assigned female at birth, and is being seen today for continued joint swelling and pain. The right 3rd finger over the PIP joint is swollen, almost twice as big as the left. She is also having continued numbness in her fingertips bilaterally. She does have a history of gout but has been trying to manage that with allopurinol  and low purine diet changes.   She was seen Virt[facility] on 04/14/24 and given a 14 day prednisone  taper, which she just completed today with no improvements or changes.   She has had multiple visits since March 2025 for this issue without any true relief or further work-up.   Problems:  Patient Active Problem List   Diagnosis Date Noted   Gout 08/01/2023   Back pain 07/29/2023   Vitamin B12 deficiency 07/29/2023   Macrocytic anemia 07/29/2023   Hypertensive emergency 07/24/2023   Hypokalemia 05/08/2019   Bell's palsy 05/08/2019   ICH (intracerebral hemorrhage) (HCC) 05/08/2019   Hypertensive crisis 10/15/2018   Hypertensive urgency 10/14/2018   Right ankle pain 10/14/2018    Allergies: No Known Allergies Medications:  Current Outpatient Medications:    acetaminophen  (TYLENOL ) 500 MG tablet, Take 1,000 mg by mouth daily as  needed for moderate pain., Disp: , Rfl:    allopurinol  (ZYLOPRIM ) 300 MG tablet, Take 1 tablet (300 mg total) by mouth daily., Disp: 90 tablet, Rfl: 1   colchicine  0.6 MG tablet, TAKE 2 TABLETS BY MOUTH ON DAY 1, THEN TAKE 1 TABLET BY MOUTH ONCE DAILY, Disp: 15 tablet, Rfl: 0   gabapentin  (NEURONTIN ) 300 MG capsule, Take 1 capsule (300 mg total) by mouth 2 (two) times daily., Disp: 180 capsule, Rfl: 1   predniSONE   (DELTASONE ) 10 MG tablet, Take 4 tablets (40 mg total) by mouth daily with breakfast for 3 days, THEN 3 tablets (30 mg total) daily with breakfast for 3 days, THEN 2 tablets (20 mg total) daily with breakfast for 2 days, THEN 1 tablet (10 mg total) daily with breakfast for 3 days., Disp: 28 tablet, Rfl: 0  Observations/Objective: Patient is well-developed, well-nourished in no acute distress.  Resting comfortably at home.  Head is normocephalic, atraumatic.  No labored breathing.  Speech is clear and coherent with logical content.  Patient is alert and oriented at baseline.  Right 3rd PIP joint is swollen with some hyperpigmentation around the joint and the proximal phalanx.   Assessment and Plan: 1. Swelling of finger joint of right hand (Primary)  2. Bilateral finger numbness  - Failed Prednisone  - Unknown trigger - In-person visit warranted for further work-up at this point (could benefit from possible xrays and labs) - Consider autoimmune vs inflammatory work up  Recommendation to be seen in person at [facility] or local Urgent Care Facility for further evaluation  Follow Up Instructions: I discussed the assessment and treatment plan with the patient. The patient was provided an opportunity to ask questions and all were answered. The patient agreed with the plan and demonstrated an understanding of the instructions.  A copy of instructions were sent to the patient via MyChart unless otherwise noted below.    The patient was advised to call back or seek an in-person evaluation if the symptoms worsen or if the condition fails to improve as anticipated.    Grace Kelp, PA-C

## 2024-04-23 NOTE — Patient Instructions (Signed)
  Grace Young, thank you for joining Angelia Kelp, PA-C for today's virtual visit.  While this provider is not your primary care provider (PCP), if your PCP is located in our provider database this encounter information will be shared with them immediately following your visit.   A [facility] MyChart account gives you access to today's visit and all your visits, tests, and labs performed at [facility] " click here if you don't have a [facility] MyChart account or go to [email or web address]  Consent: (Patient) Grace Young provided verbal consent for this virtual visit at the beginning of the encounter.  Current Medications:  Current Outpatient Medications:    acetaminophen  (TYLENOL ) 500 MG tablet, Take 1,000 mg by mouth daily as needed for moderate pain., Disp: , Rfl:    allopurinol  (ZYLOPRIM ) 300 MG tablet, Take 1 tablet (300 mg total) by mouth daily., Disp: 90 tablet, Rfl: 1   colchicine  0.6 MG tablet, TAKE 2 TABLETS BY MOUTH ON DAY 1, THEN TAKE 1 TABLET BY MOUTH ONCE DAILY, Disp: 15 tablet, Rfl: 0   gabapentin  (NEURONTIN ) 300 MG capsule, Take 1 capsule (300 mg total) by mouth 2 (two) times daily., Disp: 180 capsule, Rfl: 1   predniSONE  (DELTASONE ) 10 MG tablet, Take 4 tablets (40 mg total) by mouth daily with breakfast for 3 days, THEN 3 tablets (30 mg total) daily with breakfast for 3 days, THEN 2 tablets (20 mg total) daily with breakfast for 2 days, THEN 1 tablet (10 mg total) daily with breakfast for 3 days., Disp: 28 tablet, Rfl: 0   Medications ordered in this encounter:  No orders of the defined types were placed in this encounter.    *If you need refills on other medications prior to your next appointment, please contact your pharmacy*  Follow-Up: Call back or seek an in-person evaluation if the symptoms worsen or if the condition fails to improve as anticipated.  [facility] [phone]   If you have been instructed to have an  in-person evaluation today at a local Urgent Care facility, please use the link below. It will take you to a list of all of our available [facility], including address, phone number and hours of operation. Please do not delay care.  [facility]  If you or a family member do not have a primary care provider, use the link below to schedule a visit and establish care. When you choose a [facility] primary care physician or advanced practice provider, you gain a long-term partner in health. Find a Primary Care Provider  Learn more about [facility]'s in-office and virtual care options: [facility] - Get Care Now

## 2024-05-07 ENCOUNTER — Ambulatory Visit: Admitting: Surgical

## 2024-06-01 ENCOUNTER — Other Ambulatory Visit: Payer: Self-pay | Admitting: Nurse Practitioner

## 2024-06-01 DIAGNOSIS — M109 Gout, unspecified: Secondary | ICD-10-CM

## 2024-06-02 ENCOUNTER — Telehealth

## 2024-06-03 NOTE — Telephone Encounter (Addendum)
 FYI Copied from CRM [id number]. Topic: Appointments - Scheduling Inquiry for Clinic >> Jun 03, 2024  7:47 AM Rosamond Comes wrote:  Reason for CRM: patient caling requesting refill allopurinol  (ZYLOPRIM ) 300 MG tablet  this medication is  Discontinued by  Winda Hastings NP.  Patient would like to transfer care to Dr Adan Holms   Please call patient with transfer of care information  Patient phone [phone]

## 2024-07-27 ENCOUNTER — Other Ambulatory Visit: Payer: Self-pay | Admitting: Surgical

## 2024-07-27 ENCOUNTER — Other Ambulatory Visit: Payer: Self-pay | Admitting: Nurse Practitioner

## 2024-08-14 ENCOUNTER — Ambulatory Visit: Admitting: Surgical

## 2024-08-24 ENCOUNTER — Telehealth: Admitting: Physician Assistant

## 2024-08-24 DIAGNOSIS — Z760 Encounter for issue of repeat prescription: Secondary | ICD-10-CM

## 2024-08-24 DIAGNOSIS — M79646 Pain in unspecified finger(s): Secondary | ICD-10-CM | POA: Diagnosis not present

## 2024-08-24 MED ORDER — GABAPENTIN 300 MG PO CAPS: 300.0000 mg | ORAL_CAPSULE | Freq: Two times a day (BID) | ORAL | 0 refills | Status: DC

## 2024-08-24 MED ORDER — LOSARTAN POTASSIUM 50 MG PO TABS
50.0000 mg | ORAL_TABLET | Freq: Every day | ORAL | 0 refills | Status: AC
Start: 2024-08-24 — End: 2024-09-23

## 2024-08-24 MED ORDER — AMLODIPINE BESYLATE 10 MG PO TABS
10.0000 mg | ORAL_TABLET | Freq: Every day | ORAL | 0 refills | Status: AC
Start: 2024-08-24 — End: 2024-09-23

## 2024-08-24 MED ORDER — PREDNISONE 10 MG (21) PO TBPK
ORAL_TABLET | Freq: Every day | ORAL | 0 refills | Status: DC
Start: 2024-08-24 — End: 2024-10-13

## 2024-08-24 MED ORDER — CARVEDILOL 12.5 MG PO TABS
12.5000 mg | ORAL_TABLET | Freq: Two times a day (BID) | ORAL | 3 refills | Status: AC
Start: 2024-08-24 — End: 2024-09-23

## 2024-08-24 NOTE — Patient Instructions (Signed)
 Lennart Ferretti, thank you for joining Lynden GORMAN Snuffer, PA-C for today's virtual visit.  While this provider is not your primary care provider (PCP), if your PCP is located in our provider database this encounter information will be shared with them immediately following your visit.   A [facility] MyChart account gives you access to today's visit and all your visits, tests, and labs performed at [facility]  click here if you don't have a [facility] MyChart account or go to [email or web address]  Consent: (Patient) Grace Young provided verbal consent for this virtual visit at the beginning of the encounter.  Current Medications:  Current Outpatient Medications:    amLODipine  (NORVASC ) 10 MG tablet, Take 1 tablet (10 mg total) by mouth daily., Disp: 30 tablet, Rfl: 0   carvedilol  (COREG ) 12.5 MG tablet, Take 1 tablet (12.5 mg total) by mouth 2 (two) times daily with a meal., Disp: 60 tablet, Rfl: 3   gabapentin  (NEURONTIN ) 300 MG capsule, Take 1 capsule (300 mg total) by mouth 2 (two) times daily., Disp: 60 capsule, Rfl: 0   losartan  (COZAAR ) 50 MG tablet, Take 1 tablet (50 mg total) by mouth daily., Disp: 30 tablet, Rfl: 0   predniSONE  (STERAPRED UNI-PAK 21 TAB) 10 MG (21) TBPK tablet, Take by mouth daily. Take 6 tabs by mouth daily  for 1 day, then 5 tabs for 1 day, then 4 tabs for 1 day, then 3 tabs for 1 day, 2 tabs for 1 day, then 1 tab by mouth daily for 1 day, Disp: 21 tablet, Rfl: 0   acetaminophen  (TYLENOL ) 500 MG tablet, Take 1,000 mg by mouth daily as needed for moderate pain., Disp: , Rfl:    allopurinol  (ZYLOPRIM ) 300 MG tablet, Take 1 tablet (300 mg total) by mouth daily., Disp: 90 tablet, Rfl: 1   celecoxib  (CELEBREX ) 100 MG capsule, Take 1 capsule by mouth twice daily, Disp: 60 capsule, Rfl: 0   colchicine  0.6 MG tablet, TAKE 2 TABLETS BY MOUTH ON DAY 1, THEN TAKE 1 TABLET BY MOUTH ONCE DAILY, Disp: 15 tablet, Rfl: 0   gabapentin  (NEURONTIN ) 300 MG capsule, Take  1 capsule (300 mg total) by mouth 2 (two) times daily., Disp: 180 capsule, Rfl: 1   Medications ordered in this encounter:  Meds ordered this encounter  Medications   predniSONE  (STERAPRED UNI-PAK 21 TAB) 10 MG (21) TBPK tablet    Sig: Take by mouth daily. Take 6 tabs by mouth daily  for 1 day, then 5 tabs for 1 day, then 4 tabs for 1 day, then 3 tabs for 1 day, 2 tabs for 1 day, then 1 tab by mouth daily for 1 day    Dispense:  21 tablet    Refill:  0   carvedilol  (COREG ) 12.5 MG tablet    Sig: Take 1 tablet (12.5 mg total) by mouth 2 (two) times daily with a meal.    Dispense:  60 tablet    Refill:  3   amLODipine  (NORVASC ) 10 MG tablet    Sig: Take 1 tablet (10 mg total) by mouth daily.    Dispense:  30 tablet    Refill:  0   losartan  (COZAAR ) 50 MG tablet    Sig: Take 1 tablet (50 mg total) by mouth daily.    Dispense:  30 tablet    Refill:  0   gabapentin  (NEURONTIN ) 300 MG capsule    Sig: Take 1 capsule (300 mg total) by mouth 2 (two) times daily.  Dispense:  60 capsule    Refill:  0     *If you need refills on other medications prior to your next appointment, please contact your pharmacy*  Follow-Up: Call back or seek an in-person evaluation if the symptoms worsen or if the condition fails to improve as anticipated.  [facility] [phone]  Other Instructions Take prednisone  as directed   You will need to make an appointment with your regular doctor regarding refills  for your medications in the future.   Please reach out to your regular doctor to request a referral to orthopedics for your chronic finger pain.   Follow up with your regular doctor in 1 week for reassessment and seek care sooner if your symptoms worsen or fail to improve.    If you have been instructed to have an in-person evaluation today at a local Urgent Care facility, please use the link below. It will take you to a list of all of our available [facility],  including address, phone number and hours of operation. Please do not delay care.  [facility]  If you or a family member do not have a primary care provider, use the link below to schedule a visit and establish care. When you choose a [facility] primary care physician or advanced practice provider, you gain a long-term partner in health. Find a Primary Care Provider  Learn more about [facility]'s in-office and virtual care options:  - Get Care Now

## 2024-08-24 NOTE — Progress Notes (Signed)
 [name], dunkleberger are scheduled for a virtual visit with your provider today.    Just as we do with appointments in the office, we must obtain your consent to participate.  Your consent will be active for this visit and any virtual visit you may have with one of our providers in the next 365 days.    If you have a MyChart account, I can also send a copy of this consent to you electronically.  All virtual visits are billed to your insurance company just like a traditional visit in the office.  As this is a virtual visit, video technology does not allow for your provider to perform a traditional examination.  This may limit your provider's ability to fully assess your condition.  If your provider identifies any concerns that need to be evaluated in person or the need to arrange testing such as labs, EKG, etc, we will make arrangements to do so.    Although advances in technology are sophisticated, we cannot ensure that it will always work on either your end or our end.  If the connection with a video visit is poor, we may have to switch to a telephone visit.  With either a video or telephone visit, we are not always able to ensure that we have a secure connection.   I need to obtain your verbal consent now.   Are you willing to proceed with your visit today?   Grace Young has provided verbal consent on 08/24/2024 for a virtual visit (video or telephone).   Grace GORMAN Snuffer, PA-C 08/24/2024  2:45 PM   Date:  08/24/2024   ID:  Grace Young, DOB 1966-03-19, MRN [id number]  Patient Location: Home Provider Location: Home Office   Participants: Patient and Provider for Visit and Wrap up  Method of visit: Video  Location of Patient: Home Location of Provider: Home Office Consent was obtain for visit over the video. Services rendered by provider: Visit was performed via video  A video enabled telemedicine application was used and I verified that I am speaking with the correct person using two  identifiers.  PCP:  No primary care provider on file.   Chief Complaint:  med refill, finger pain  History of Present Illness:    Grace Young is a 58 y.o. female with history as stated below. Presents video telehealth for an acute care visit  Pt states that she need a refill for gabapentin  and blood pressure medications  She reports she started having pain to her right middle finger for the last 6 months. She reports she has been treated for infection in the past and has had no relief. She denies any warmth to the area. Denies any trauma or injuries. She feels that she cannot bend it due to pain.   Past Medical, Surgical, Social History, Allergies, and Medications have been Reviewed.  Past Medical History:  Diagnosis Date   Bell's palsy    Hypertension     No outpatient medications have been marked as taking for the 08/24/24 encounter (Appointment) with [facility] PROVIDER.     Allergies:   Patient has no known allergies.   ROS See HPI for history of present illness.  Physical Exam Constitutional:      Appearance: Normal appearance.  Pulmonary:     Effort: Pulmonary effort is normal.  Musculoskeletal:     Comments: Right middle finger with trace swelling. No fusiform swelling. Decreased ROM  Neurological:     Mental Status: She is alert.  MDM: Pt c/o chronic finger pain. Has been treated with steroids in the past which has helped. Considered inflammatory arthritis vs osteoarthritis. Doubt infectious process. Additionally is requesting refill gabapentin  and BP meds.    Tests Ordered: No orders of the defined types were placed in this encounter.   Medication Changes: No orders of the defined types were placed in this encounter.    Disposition:  Follow up  Signed, Grace GORMAN Snuffer, PA-C  08/24/2024 2:45 PM

## 2024-09-19 ENCOUNTER — Other Ambulatory Visit: Payer: Self-pay | Admitting: Surgical

## 2024-10-08 ENCOUNTER — Ambulatory Visit: Admitting: Surgical

## 2024-10-13 ENCOUNTER — Telehealth: Admitting: Physician Assistant

## 2024-10-13 DIAGNOSIS — Z760 Encounter for issue of repeat prescription: Secondary | ICD-10-CM | POA: Diagnosis not present

## 2024-10-13 MED ORDER — GABAPENTIN 300 MG PO CAPS: 300.0000 mg | ORAL_CAPSULE | Freq: Two times a day (BID) | ORAL | 0 refills | Status: AC

## 2024-10-13 NOTE — Patient Instructions (Signed)
 Lennart Ferretti, thank you for joining [name], PA-C for today's virtual visit.  While this provider is not your primary care provider (PCP), if your PCP is located in our provider database this encounter information will be shared with them immediately following your visit.   A [facility] MyChart account gives you access to today's visit and all your visits, tests, and labs performed at [facility]  click here if you don't have a [facility] MyChart account or go to [email or web address]  Consent: (Patient) Grace Young provided verbal consent for this virtual visit at the beginning of the encounter.  Current Medications:  Current Outpatient Medications:    acetaminophen  (TYLENOL ) 500 MG tablet, Take 1,000 mg by mouth daily as needed for moderate pain., Disp: , Rfl:    allopurinol  (ZYLOPRIM ) 300 MG tablet, Take 1 tablet (300 mg total) by mouth daily., Disp: 90 tablet, Rfl: 1   amLODipine  (NORVASC ) 10 MG tablet, Take 1 tablet (10 mg total) by mouth daily., Disp: 30 tablet, Rfl: 0   carvedilol  (COREG ) 12.5 MG tablet, Take 1 tablet (12.5 mg total) by mouth 2 (two) times daily with a meal., Disp: 60 tablet, Rfl: 3   celecoxib  (CELEBREX ) 100 MG capsule, Take 1 capsule by mouth twice daily, Disp: 60 capsule, Rfl: 0   colchicine  0.6 MG tablet, TAKE 2 TABLETS BY MOUTH ON DAY 1, THEN TAKE 1 TABLET BY MOUTH ONCE DAILY, Disp: 15 tablet, Rfl: 0   gabapentin  (NEURONTIN ) 300 MG capsule, Take 1 capsule (300 mg total) by mouth 2 (two) times daily., Disp: 180 capsule, Rfl: 1   gabapentin  (NEURONTIN ) 300 MG capsule, Take 1 capsule (300 mg total) by mouth 2 (two) times daily., Disp: 60 capsule, Rfl: 0   losartan  (COZAAR ) 50 MG tablet, Take 1 tablet (50 mg total) by mouth daily., Disp: 30 tablet, Rfl: 0   predniSONE  (STERAPRED UNI-PAK 21 TAB) 10 MG (21) TBPK tablet, Take by mouth daily. Take 6 tabs by mouth daily  for 1 day, then 5 tabs for 1 day, then 4 tabs for 1 day, then 3 tabs for 1  day, 2 tabs for 1 day, then 1 tab by mouth daily for 1 day, Disp: 21 tablet, Rfl: 0   Medications ordered in this encounter:  No orders of the defined types were placed in this encounter.    *If you need refills on other medications prior to your next appointment, please contact your pharmacy*  Follow-Up: Call back or seek an in-person evaluation if the symptoms worsen or if the condition fails to improve as anticipated.  [facility] [phone]  Other Instructions Your medication has been refilled. As discussed, our Virtual Primary Care team will reach out to you for some further evaluation/ongoing management.   If you have been instructed to have an in-person evaluation today at a local Urgent Care facility, please use the link below. It will take you to a list of all of our available [facility], including address, phone number and hours of operation. Please do not delay care.  [facility]  If you or a family member do not have a primary care provider, use the link below to schedule a visit and establish care. When you choose a [facility] primary care physician or advanced practice provider, you gain a long-term partner in health. Find a Primary Care Provider  Learn more about [facility]'s in-office and virtual care options:  - Get Care Now

## 2024-10-13 NOTE — Progress Notes (Signed)
 Virtual Visit Consent   Grace Young, you are scheduled for a virtual visit with a [facility] provider today. Just as with appointments in the office, your consent must be obtained to participate. Your consent will be active for this visit and any virtual visit you may have with one of our providers in the next 365 days. If you have a MyChart account, a copy of this consent can be sent to you electronically.  As this is a virtual visit, video technology does not allow for your provider to perform a traditional examination. This may limit your provider's ability to fully assess your condition. If your provider identifies any concerns that need to be evaluated in person or the need to arrange testing (such as labs, EKG, etc.), we will make arrangements to do so. Although advances in technology are sophisticated, we cannot ensure that it will always work on either your end or our end. If the connection with a video visit is poor, the visit may have to be switched to a telephone visit. With either a video or telephone visit, we are not always able to ensure that we have a secure connection.  By engaging in this virtual visit, you consent to the provision of healthcare and authorize for your insurance to be billed (if applicable) for the services provided during this visit. Depending on your insurance coverage, you may receive a charge related to this service.  I need to obtain your verbal consent now. Are you willing to proceed with your visit today? Grace Young has provided verbal consent on 10/13/2024 for a virtual visit (video or telephone). Grace Young, [place]  Date: 10/13/2024 3:25 PM  Virtual Visit via Video Note   I, Grace Young, connected with  Grace Young  ([id number], 12-17-1966) on 10/13/24 at  3:15 PM EDT by a video-enabled telemedicine application and verified that I am speaking with the correct person using two identifiers.  Location: Patient: Virtual Visit Location  Patient: Home Provider: Virtual Visit Location Provider: Home Office   I discussed the limitations of evaluation and management by telemedicine and the availability of in person appointments. The patient expressed understanding and agreed to proceed.    History of Present Illness: Grace Young is a 58 y.o. who identifies as a female who was assigned female at birth, and is being seen today for request of medication refill. Notes she has been out of her gabapentin  for a few days with increase in her pain and some anxiety/decreased sleep secondary to pain. Has not been able to establish with a new PCP due to some transportation issues.   HPI: HPI  Problems:  Patient Active Problem List   Diagnosis Date Noted   Gout 08/01/2023   Back pain 07/29/2023   Vitamin B12 deficiency 07/29/2023   Macrocytic anemia 07/29/2023   Hypertensive emergency 07/24/2023   Hypokalemia 05/08/2019   Bell's palsy 05/08/2019   ICH (intracerebral hemorrhage) (HCC) 05/08/2019   Hypertensive crisis 10/15/2018   Hypertensive urgency 10/14/2018   Right ankle pain 10/14/2018    Allergies: No Known Allergies Medications:  Current Outpatient Medications:    acetaminophen  (TYLENOL ) 500 MG tablet, Take 1,000 mg by mouth daily as needed for moderate pain., Disp: , Rfl:    allopurinol  (ZYLOPRIM ) 300 MG tablet, Take 1 tablet (300 mg total) by mouth daily., Disp: 90 tablet, Rfl: 1   amLODipine  (NORVASC ) 10 MG tablet, Take 1 tablet (10 mg total) by mouth daily., Disp: 30 tablet, Rfl: 0   carvedilol  (  COREG ) 12.5 MG tablet, Take 1 tablet (12.5 mg total) by mouth 2 (two) times daily with a meal., Disp: 60 tablet, Rfl: 3   celecoxib  (CELEBREX ) 100 MG capsule, Take 1 capsule by mouth twice daily, Disp: 60 capsule, Rfl: 0   gabapentin  (NEURONTIN ) 300 MG capsule, Take 1 capsule (300 mg total) by mouth 2 (two) times daily., Disp: 60 capsule, Rfl: 0   losartan  (COZAAR ) 50 MG tablet, Take 1 tablet (50 mg total) by mouth daily., Disp:  30 tablet, Rfl: 0  Observations/Objective: Patient is well-developed, well-nourished in no acute distress.  Resting comfortably  at home.  Head is normocephalic, atraumatic.  No labored breathing.  Speech is clear and coherent with logical content.  Patient is alert and oriented at baseline.   Assessment and Plan: 1. Encounter for medication refill (Primary)  One-time refill of Gabapentin  given. She has gotten a previous refill of this from  our team, so discussed would be last refill we can provide without her having longitudinal care. Since transportation is an issue, will refer her to our [facility] program for some transitional care for her.  Follow Up Instructions: I discussed the assessment and treatment plan with the patient. The patient was provided an opportunity to ask questions and all were answered. The patient agreed with the plan and demonstrated an understanding of the instructions.  A copy of instructions were sent to the patient via MyChart unless otherwise noted below.   The patient was advised to call back or seek an in-person evaluation if the symptoms worsen or if the condition fails to improve as anticipated.    Grace Velma Lunger, PA-C

## 2024-10-15 ENCOUNTER — Encounter: Payer: Self-pay | Admitting: Nurse Practitioner

## 2024-10-22 ENCOUNTER — Ambulatory Visit: Admitting: Nurse Practitioner

## 2024-10-26 ENCOUNTER — Encounter: Payer: Self-pay | Admitting: Radiology

## 2024-11-02 ENCOUNTER — Ambulatory Visit: Admitting: Nurse Practitioner

## 2024-12-04 ENCOUNTER — Other Ambulatory Visit: Payer: Self-pay | Admitting: Surgical

## 2025-01-30 ENCOUNTER — Telehealth
